# Patient Record
Sex: Female | Born: 1960 | Race: White | Hispanic: No | Marital: Married
Health system: Northeastern US, Academic
[De-identification: ages and names within clinical notes are randomized; demographics above are authoritative.]

## PROBLEM LIST (undated history)

## (undated) DIAGNOSIS — Z5189 Encounter for other specified aftercare: Secondary | ICD-10-CM

## (undated) DIAGNOSIS — F32A Depression, unspecified: Secondary | ICD-10-CM

## (undated) DIAGNOSIS — Z8616 Personal history of COVID-19: Secondary | ICD-10-CM

## (undated) DIAGNOSIS — F419 Anxiety disorder, unspecified: Secondary | ICD-10-CM

## (undated) DIAGNOSIS — K219 Gastro-esophageal reflux disease without esophagitis: Secondary | ICD-10-CM

## (undated) DIAGNOSIS — M1990 Unspecified osteoarthritis, unspecified site: Secondary | ICD-10-CM

## (undated) DIAGNOSIS — G8929 Other chronic pain: Secondary | ICD-10-CM

## (undated) DIAGNOSIS — K529 Noninfective gastroenteritis and colitis, unspecified: Secondary | ICD-10-CM

## (undated) DIAGNOSIS — T7840XA Allergy, unspecified, initial encounter: Secondary | ICD-10-CM

## (undated) DIAGNOSIS — D649 Anemia, unspecified: Secondary | ICD-10-CM

## (undated) DIAGNOSIS — IMO0002 Reserved for concepts with insufficient information to code with codable children: Secondary | ICD-10-CM

## (undated) DIAGNOSIS — M549 Dorsalgia, unspecified: Secondary | ICD-10-CM

## (undated) DIAGNOSIS — I10 Essential (primary) hypertension: Secondary | ICD-10-CM

## (undated) DIAGNOSIS — R011 Cardiac murmur, unspecified: Secondary | ICD-10-CM

## (undated) DIAGNOSIS — N200 Calculus of kidney: Secondary | ICD-10-CM

## (undated) DIAGNOSIS — E119 Type 2 diabetes mellitus without complications: Secondary | ICD-10-CM

## (undated) DIAGNOSIS — J302 Other seasonal allergic rhinitis: Secondary | ICD-10-CM

## (undated) DIAGNOSIS — K5090 Crohn's disease, unspecified, without complications: Secondary | ICD-10-CM

## (undated) DIAGNOSIS — Z87442 Personal history of urinary calculi: Secondary | ICD-10-CM

## (undated) DIAGNOSIS — N189 Chronic kidney disease, unspecified: Secondary | ICD-10-CM

## (undated) HISTORY — PX: KNEE ARTHROSCOPY: SHX127

## (undated) HISTORY — DX: Depression, unspecified: F32.A

## (undated) HISTORY — PX: CERVICAL SPINE SURGERY: SHX589

## (undated) HISTORY — DX: Anxiety disorder, unspecified: F41.9

## (undated) HISTORY — DX: Calculus of kidney: N20.0

## (undated) HISTORY — DX: Type 2 diabetes mellitus without complications: E11.9

## (undated) HISTORY — PX: ABDOMINAL HYSTERECTOMY: SHX81

## (undated) HISTORY — PX: COLONOSCOPY: SHX174

## (undated) HISTORY — PX: ROTATOR CUFF REPAIR: SHX139

## (undated) HISTORY — DX: Encounter for other specified aftercare: Z51.89

## (undated) HISTORY — DX: Allergy, unspecified, initial encounter: T78.40XA

## (undated) HISTORY — DX: Cardiac murmur, unspecified: R01.1

## (undated) HISTORY — DX: Gastro-esophageal reflux disease without esophagitis: K21.9

## (undated) HISTORY — PX: OTHER SURGICAL HISTORY: SHX169

## (undated) HISTORY — DX: Anemia, unspecified: D64.9

## (undated) HISTORY — DX: Essential (primary) hypertension: I10

## (undated) HISTORY — PX: LIVER SURGERY: SHX698

---

## 1990-11-03 DIAGNOSIS — I669 Occlusion and stenosis of unspecified cerebral artery: Secondary | ICD-10-CM

## 1990-11-03 HISTORY — PX: CERVICAL SPINE SURGERY: SHX589

## 1990-11-03 HISTORY — PX: LIVER SURGERY: SHX698

## 1990-11-03 HISTORY — PX: ROTATOR CUFF REPAIR: SHX139

## 1990-11-03 HISTORY — DX: Occlusion and stenosis of unspecified cerebral artery: I66.9

## 1990-11-03 HISTORY — PX: KNEE ARTHROSCOPY: SHX127

## 1998-03-15 ENCOUNTER — Ambulatory Visit (HOSPITAL_BASED_OUTPATIENT_CLINIC_OR_DEPARTMENT_OTHER): Admission: RE | Admit: 1998-03-15 | Discharge: 1998-03-15 | Payer: Self-pay | Admitting: Orthopedic Surgery

## 1999-03-14 ENCOUNTER — Other Ambulatory Visit: Admission: RE | Admit: 1999-03-14 | Discharge: 1999-03-14 | Payer: Self-pay | Admitting: Internal Medicine

## 1999-05-28 ENCOUNTER — Encounter (INDEPENDENT_AMBULATORY_CARE_PROVIDER_SITE_OTHER): Payer: Self-pay | Admitting: Specialist

## 1999-05-28 ENCOUNTER — Other Ambulatory Visit: Admission: RE | Admit: 1999-05-28 | Discharge: 1999-05-28 | Payer: Self-pay | Admitting: Obstetrics and Gynecology

## 1999-11-27 ENCOUNTER — Encounter: Admission: RE | Admit: 1999-11-27 | Discharge: 1999-11-27 | Payer: Self-pay | Admitting: Internal Medicine

## 1999-11-27 ENCOUNTER — Encounter: Payer: Self-pay | Admitting: Internal Medicine

## 1999-12-26 ENCOUNTER — Ambulatory Visit (HOSPITAL_COMMUNITY): Admission: RE | Admit: 1999-12-26 | Discharge: 1999-12-26 | Payer: Self-pay | Admitting: Gastroenterology

## 2000-04-02 ENCOUNTER — Other Ambulatory Visit: Admission: RE | Admit: 2000-04-02 | Discharge: 2000-04-02 | Payer: Self-pay | Admitting: *Deleted

## 2000-04-02 ENCOUNTER — Encounter (INDEPENDENT_AMBULATORY_CARE_PROVIDER_SITE_OTHER): Payer: Self-pay | Admitting: Specialist

## 2000-04-20 ENCOUNTER — Encounter (INDEPENDENT_AMBULATORY_CARE_PROVIDER_SITE_OTHER): Payer: Self-pay

## 2000-04-20 ENCOUNTER — Ambulatory Visit (HOSPITAL_COMMUNITY): Admission: RE | Admit: 2000-04-20 | Discharge: 2000-04-20 | Payer: Self-pay | Admitting: Gastroenterology

## 2000-06-17 ENCOUNTER — Other Ambulatory Visit: Admission: RE | Admit: 2000-06-17 | Discharge: 2000-06-17 | Payer: Self-pay | Admitting: Obstetrics & Gynecology

## 2000-06-17 ENCOUNTER — Encounter (INDEPENDENT_AMBULATORY_CARE_PROVIDER_SITE_OTHER): Payer: Self-pay

## 2000-07-21 ENCOUNTER — Encounter (INDEPENDENT_AMBULATORY_CARE_PROVIDER_SITE_OTHER): Payer: Self-pay | Admitting: Specialist

## 2000-07-22 ENCOUNTER — Inpatient Hospital Stay (HOSPITAL_COMMUNITY): Admission: AD | Admit: 2000-07-22 | Discharge: 2000-07-23 | Payer: Self-pay | Admitting: Obstetrics & Gynecology

## 2000-12-07 ENCOUNTER — Emergency Department (HOSPITAL_COMMUNITY): Admission: EM | Admit: 2000-12-07 | Discharge: 2000-12-07 | Payer: Self-pay | Admitting: Emergency Medicine

## 2001-08-18 ENCOUNTER — Other Ambulatory Visit: Admission: RE | Admit: 2001-08-18 | Discharge: 2001-08-18 | Payer: Self-pay | Admitting: Obstetrics & Gynecology

## 2001-08-26 ENCOUNTER — Encounter: Admission: RE | Admit: 2001-08-26 | Discharge: 2001-08-26 | Payer: Self-pay | Admitting: Obstetrics & Gynecology

## 2001-08-26 ENCOUNTER — Encounter: Payer: Self-pay | Admitting: Obstetrics & Gynecology

## 2002-07-01 ENCOUNTER — Ambulatory Visit (HOSPITAL_COMMUNITY): Admission: RE | Admit: 2002-07-01 | Discharge: 2002-07-01 | Payer: Self-pay | Admitting: Urology

## 2002-07-01 ENCOUNTER — Encounter: Payer: Self-pay | Admitting: Urology

## 2003-07-08 ENCOUNTER — Encounter: Payer: Self-pay | Admitting: Emergency Medicine

## 2003-07-08 ENCOUNTER — Emergency Department (HOSPITAL_COMMUNITY): Admission: EM | Admit: 2003-07-08 | Discharge: 2003-07-08 | Payer: Self-pay | Admitting: Emergency Medicine

## 2003-10-18 ENCOUNTER — Emergency Department (HOSPITAL_COMMUNITY): Admission: AD | Admit: 2003-10-18 | Discharge: 2003-10-18 | Payer: Self-pay | Admitting: Family Medicine

## 2003-11-03 ENCOUNTER — Emergency Department (HOSPITAL_COMMUNITY): Admission: AD | Admit: 2003-11-03 | Discharge: 2003-11-03 | Payer: Self-pay | Admitting: Family Medicine

## 2003-11-14 ENCOUNTER — Emergency Department (HOSPITAL_COMMUNITY): Admission: AD | Admit: 2003-11-14 | Discharge: 2003-11-14 | Payer: Self-pay | Admitting: Family Medicine

## 2003-11-15 ENCOUNTER — Emergency Department (HOSPITAL_COMMUNITY): Admission: AD | Admit: 2003-11-15 | Discharge: 2003-11-15 | Payer: Self-pay | Admitting: Family Medicine

## 2003-11-20 ENCOUNTER — Emergency Department (HOSPITAL_COMMUNITY): Admission: AD | Admit: 2003-11-20 | Discharge: 2003-11-20 | Payer: Self-pay | Admitting: Family Medicine

## 2003-11-27 ENCOUNTER — Emergency Department (HOSPITAL_COMMUNITY): Admission: AD | Admit: 2003-11-27 | Discharge: 2003-11-27 | Payer: Self-pay | Admitting: Family Medicine

## 2003-11-29 ENCOUNTER — Emergency Department (HOSPITAL_COMMUNITY): Admission: EM | Admit: 2003-11-29 | Discharge: 2003-11-29 | Payer: Self-pay | Admitting: Emergency Medicine

## 2004-02-20 ENCOUNTER — Emergency Department (HOSPITAL_COMMUNITY): Admission: EM | Admit: 2004-02-20 | Discharge: 2004-02-20 | Payer: Self-pay | Admitting: Family Medicine

## 2004-02-28 ENCOUNTER — Emergency Department (HOSPITAL_COMMUNITY): Admission: EM | Admit: 2004-02-28 | Discharge: 2004-02-28 | Payer: Self-pay | Admitting: Family Medicine

## 2004-04-26 ENCOUNTER — Inpatient Hospital Stay (HOSPITAL_COMMUNITY): Admission: AD | Admit: 2004-04-26 | Discharge: 2004-05-04 | Payer: Self-pay | Admitting: Gastroenterology

## 2004-07-10 ENCOUNTER — Emergency Department (HOSPITAL_COMMUNITY): Admission: EM | Admit: 2004-07-10 | Discharge: 2004-07-10 | Payer: Self-pay | Admitting: Family Medicine

## 2004-07-22 ENCOUNTER — Emergency Department (HOSPITAL_COMMUNITY): Admission: EM | Admit: 2004-07-22 | Discharge: 2004-07-22 | Payer: Self-pay | Admitting: Family Medicine

## 2004-10-21 ENCOUNTER — Emergency Department (HOSPITAL_COMMUNITY): Admission: EM | Admit: 2004-10-21 | Discharge: 2004-10-21 | Payer: Self-pay | Admitting: Family Medicine

## 2004-11-11 ENCOUNTER — Emergency Department (HOSPITAL_COMMUNITY): Admission: EM | Admit: 2004-11-11 | Discharge: 2004-11-11 | Payer: Self-pay | Admitting: Family Medicine

## 2006-03-31 ENCOUNTER — Emergency Department (HOSPITAL_COMMUNITY): Admission: EM | Admit: 2006-03-31 | Discharge: 2006-03-31 | Payer: Self-pay | Admitting: Family Medicine

## 2006-04-01 ENCOUNTER — Emergency Department (HOSPITAL_COMMUNITY): Admission: EM | Admit: 2006-04-01 | Discharge: 2006-04-01 | Payer: Self-pay | Admitting: Emergency Medicine

## 2006-04-03 ENCOUNTER — Ambulatory Visit (HOSPITAL_COMMUNITY): Admission: RE | Admit: 2006-04-03 | Discharge: 2006-04-03 | Payer: Self-pay | Admitting: Urology

## 2006-04-08 ENCOUNTER — Emergency Department (HOSPITAL_COMMUNITY): Admission: EM | Admit: 2006-04-08 | Discharge: 2006-04-08 | Payer: Self-pay | Admitting: Emergency Medicine

## 2006-04-16 ENCOUNTER — Encounter: Admission: RE | Admit: 2006-04-16 | Discharge: 2006-04-16 | Payer: Self-pay | Admitting: Internal Medicine

## 2006-05-19 ENCOUNTER — Encounter: Admission: RE | Admit: 2006-05-19 | Discharge: 2006-05-19 | Payer: Self-pay | Admitting: Internal Medicine

## 2006-06-01 ENCOUNTER — Encounter: Admission: RE | Admit: 2006-06-01 | Discharge: 2006-06-01 | Payer: Self-pay | Admitting: Internal Medicine

## 2006-07-10 ENCOUNTER — Encounter: Admission: RE | Admit: 2006-07-10 | Discharge: 2006-07-10 | Payer: Self-pay | Admitting: Internal Medicine

## 2007-02-04 ENCOUNTER — Encounter
Admission: RE | Admit: 2007-02-04 | Discharge: 2007-02-04 | Payer: Self-pay | Admitting: Physical Medicine and Rehabilitation

## 2008-08-04 ENCOUNTER — Emergency Department (HOSPITAL_COMMUNITY): Admission: EM | Admit: 2008-08-04 | Discharge: 2008-08-04 | Payer: Self-pay | Admitting: Emergency Medicine

## 2008-08-18 ENCOUNTER — Encounter (HOSPITAL_COMMUNITY): Admission: RE | Admit: 2008-08-18 | Discharge: 2008-11-02 | Payer: Self-pay | Admitting: Emergency Medicine

## 2008-09-11 ENCOUNTER — Encounter: Admission: RE | Admit: 2008-09-11 | Discharge: 2008-11-02 | Payer: Self-pay | Admitting: Internal Medicine

## 2008-09-24 ENCOUNTER — Emergency Department (HOSPITAL_COMMUNITY): Admission: EM | Admit: 2008-09-24 | Discharge: 2008-09-24 | Payer: Self-pay | Admitting: Emergency Medicine

## 2008-11-02 ENCOUNTER — Emergency Department (HOSPITAL_COMMUNITY): Admission: EM | Admit: 2008-11-02 | Discharge: 2008-11-02 | Payer: Self-pay | Admitting: Family Medicine

## 2009-01-28 ENCOUNTER — Emergency Department (HOSPITAL_COMMUNITY): Admission: EM | Admit: 2009-01-28 | Discharge: 2009-01-28 | Payer: Self-pay | Admitting: Family Medicine

## 2009-01-31 ENCOUNTER — Emergency Department (HOSPITAL_COMMUNITY): Admission: EM | Admit: 2009-01-31 | Discharge: 2009-01-31 | Payer: Self-pay | Admitting: Family Medicine

## 2009-03-23 ENCOUNTER — Emergency Department (HOSPITAL_COMMUNITY): Admission: EM | Admit: 2009-03-23 | Discharge: 2009-03-23 | Payer: Self-pay | Admitting: Emergency Medicine

## 2009-04-10 ENCOUNTER — Emergency Department (HOSPITAL_COMMUNITY): Admission: EM | Admit: 2009-04-10 | Discharge: 2009-04-10 | Payer: Self-pay | Admitting: Emergency Medicine

## 2009-04-20 ENCOUNTER — Emergency Department (HOSPITAL_COMMUNITY): Admission: EM | Admit: 2009-04-20 | Discharge: 2009-04-20 | Payer: Self-pay | Admitting: *Deleted

## 2009-10-08 ENCOUNTER — Emergency Department (HOSPITAL_COMMUNITY): Admission: EM | Admit: 2009-10-08 | Discharge: 2009-10-08 | Payer: Self-pay | Admitting: Family Medicine

## 2009-10-24 ENCOUNTER — Emergency Department (HOSPITAL_COMMUNITY): Admission: EM | Admit: 2009-10-24 | Discharge: 2009-10-24 | Payer: Self-pay | Admitting: Family Medicine

## 2009-12-09 ENCOUNTER — Emergency Department (HOSPITAL_COMMUNITY): Admission: EM | Admit: 2009-12-09 | Discharge: 2009-12-09 | Payer: Self-pay | Admitting: Emergency Medicine

## 2010-02-15 ENCOUNTER — Emergency Department (HOSPITAL_COMMUNITY): Admission: EM | Admit: 2010-02-15 | Discharge: 2010-02-15 | Payer: Self-pay | Admitting: Family Medicine

## 2010-09-13 ENCOUNTER — Emergency Department (HOSPITAL_COMMUNITY): Admission: EM | Admit: 2010-09-13 | Discharge: 2010-09-13 | Payer: Self-pay | Admitting: Emergency Medicine

## 2010-09-15 ENCOUNTER — Emergency Department (HOSPITAL_COMMUNITY): Admission: EM | Admit: 2010-09-15 | Discharge: 2010-09-15 | Payer: Self-pay | Admitting: Family Medicine

## 2010-09-18 ENCOUNTER — Emergency Department (HOSPITAL_COMMUNITY): Admission: EM | Admit: 2010-09-18 | Discharge: 2010-09-18 | Payer: Self-pay | Admitting: Family Medicine

## 2010-11-09 ENCOUNTER — Emergency Department (HOSPITAL_COMMUNITY)
Admission: EM | Admit: 2010-11-09 | Discharge: 2010-11-09 | Payer: Self-pay | Source: Home / Self Care | Admitting: Family Medicine

## 2010-11-15 ENCOUNTER — Emergency Department (HOSPITAL_COMMUNITY)
Admission: EM | Admit: 2010-11-15 | Discharge: 2010-11-15 | Payer: Self-pay | Source: Home / Self Care | Admitting: Family Medicine

## 2010-11-17 ENCOUNTER — Emergency Department (HOSPITAL_COMMUNITY)
Admission: EM | Admit: 2010-11-17 | Discharge: 2010-11-17 | Payer: Self-pay | Source: Home / Self Care | Admitting: Family Medicine

## 2010-11-24 ENCOUNTER — Encounter: Payer: Self-pay | Admitting: Otolaryngology

## 2011-01-14 LAB — POCT URINALYSIS DIPSTICK
Bilirubin Urine: NEGATIVE
Glucose, UA: NEGATIVE mg/dL
Hgb urine dipstick: NEGATIVE
Ketones, ur: NEGATIVE mg/dL
Nitrite: NEGATIVE
Protein, ur: NEGATIVE mg/dL
Specific Gravity, Urine: >=1.03 (ref 1.005–1.030)
Urobilinogen, UA: 0.2 mg/dL (ref 0.0–1.0)
pH: 5.5 (ref 5.0–8.0)

## 2011-01-14 LAB — POCT RAPID STREP A (OFFICE): Streptococcus, Group A Screen (Direct): NEGATIVE

## 2011-01-22 LAB — URINALYSIS, ROUTINE W REFLEX MICROSCOPIC
Bilirubin Urine: NEGATIVE
Glucose, UA: NEGATIVE mg/dL
Hgb urine dipstick: NEGATIVE
Ketones, ur: NEGATIVE mg/dL
Nitrite: NEGATIVE
Protein, ur: NEGATIVE mg/dL
Specific Gravity, Urine: 1.019 (ref 1.005–1.030)
Urobilinogen, UA: 0.2 mg/dL (ref 0.0–1.0)
pH: 7.5 (ref 5.0–8.0)
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
## 2011-03-21 NOTE — Op Note (Signed)
[facility]  Patient:    Sheila Vincent, Sheila Vincent                    MRN: [id number] Proc. Date: 07/21/00 Adm. Date:  [id number] Attending:  Princess Bruins                           Operative Report  DATE OF BIRTH:                May 08, 2061  PREOPERATIVE DIAGNOSIS:       Refractory menometrorrhagia with symptomatic uterine myomas.  POSTOPERATIVE DIAGNOSIS:      Refractory menometrorrhagia with symptomatic uterine myomas.  OPERATION:                    Laparoscopically-assisted vaginal hysterectomy.  SURGEON:                      Princess Bruins, M.D.  ASSISTANT:                    Freddie Apley, M.D.  ANESTHESIOLOGIST:             Loren Racer. Charlean Merl, M.D.  ESTIMATED BLOOD LOSS:         500 cc.  DESCRIPTION OF PROCEDURE:     Under general anesthesia with endotracheal intubation, the patient was placed in the lithotomy position for surgical laparoscopy.  She was prepped with Betadine in the abdominal, suprapubic, and vulvar areas.  The bladder catheter was inserted and the patient was draped as usual.  The vaginal exam revealed an anteverted uterus with a normal volume and mobile.  No adnexal mass.  Slight irregularity associated with myoma.  The speculum exam revealed a normal cervix, vagina, and vulva.  The uterus was cannulated.  The speculum was then removed.  An infraumbilical incision was made with a scalpel for 10 mm.  The Verres needle was inserted while raising the abdominal wall.  Security taps were done.  A pneumoperitoneum was created with 3.5 L of CO2.  Then the Veress needle was removed.  The trocar was inserted with the laparoscope.  A counterincision in the suprapubic area was done over 5 mm with a scalpel.  The 5 mm trocar was inserted with a probe. Inspection of the abdominal cavity revealed a normal liver with no adhesions seen.  The pelvic cavity revealed a slightly increased uterine volume with some irregularity.  The  fibroids were intramural or submucosal.  The two tubes were normal, as well as the two ovaries.  No pathology in the pelvis was seen otherwise.  Two counterincisions were made, one in the right iliac area and the other one in the left iliac area over 5 mm.  Then 5 mm trocars were inserted in both locations.  The instruments were inserted, the tripolar and the clamp. We proceeded exactly the same way on the left side as the right side, starting with the left, cauterizing and then cutting the round ligament, the tube, the utero-ovarian ligament and going down following the border of the uterus and stopping just before the uterine artery.  We then opened the peritoneum at the level of the lower uterine segment with scissors and reclined the bladder downward.  The hemostasis being adequate, the laparoscopic parts being finished, we then removed the instruments, leaving the trocars in place and moved vaginally.  The patient was repositioned.  The weighted speculum  was inserted.  We then removed the uterine cannula and put the Hulka tenaculum on the cervix, one on the anterior lip and the other on the posterior lip.  We then infiltrated the traction between the cervix and vagina all around with Pitressin 10 and 50.  We then sectioned circularly the junction between the vagina and the cervix with the scalpel all around the cervix.  We then opened the peritoneum posteriorly and inserted the weighted speculum there.  The interperitoneum was also opened and the retractor was inserted.  We clamped on each side the uterosacral ligaments and the cardinal ligaments with curved Heaney.  We sectioned with the Mayo scissors and sutured with 0 Vicryl in an ______ stitch.  We then clamped on each the uterine arteries.  We sectioned with the Mayo scissors and sutured with 0 Vicryl.  We did the last clamp on each side, reaching the area where the surgery was done laparoscopically.  We clamped a section with  Mayos and sutured with 0 Vicryl. To achieve those two last clamps, we removed the cervix and opened the uterus in the midline.  The two pieces of the uterus were sent to pathology.  We then completed hemostasis at the level of the left uterine artery with an extra suture.  Then hemostasis was good on both sides.  All pedicles were looked at.  We therefore pursued with suspension.  Using 0 Vicryl, we started on the left side, going through the anterior vaginal wall, the anterior peritoneum, the uterosacral ligaments, and then posterior peritoneum and the posterior vaginal wall.  This was kept on hemostats.  We pursued exactly the same on the right side.  We then closed the uterosacral ligaments by attaching them together in the midline.  We then closed the vaginal vault longitudinally with X stitches of 0 Vicryl.  We then attached the suspensory stitches on each side.  The hemostasis was very good.  No packing was done in the vagina.  The instruments were removed.  We went back abdominally.  The peritoneum was recreated.  With irrigation and suction, we evaluated hemostasis in the pelvis.  All pedicles were showing good hemostasis.  Absolutely no bleeding was present.  The irrigation water was clear.  We therefore removed the instruments.  Pictures were taken before and after the surgery.  We removed all instruments.  The CO2 was evacuated.  The aponeurosis was closed with 0 Vicryl at the umbilicus. The skin was closed with 4-0 Monocryl at all incisions.  The estimated blood loss was 500 cc.  The urine was clear.  The count of gauze and instruments was complete x 2.  No complications occurred.  The patient was sent to the recovery room in good status. DD:  07/21/00 TD:  07/22/00 Job: [id number]

## 2011-03-21 NOTE — Op Note (Signed)
Sheila Vincent, Sheila Vincent             ACCOUNT NO.:  [id number]   MEDICAL RECORD NO.:  [id number]          PATIENT TYPE:  AMB   LOCATION:  DAY                          FACILITY:  [facility]   PHYSICIAN:  Hanley Ben, M.D.  DATE OF BIRTH:  Jan 10, 1961   DATE OF PROCEDURE:  04/03/2006  DATE OF DISCHARGE:                                 OPERATIVE REPORT   PREOPERATIVE DIAGNOSIS:  Left ureteral stone.   POSTOPERATIVE DIAGNOSIS:  Left ureteral stone.   PROCEDURE:  Cystoscopy, left retrograde pyelogram, ureteroscopy, stone  extraction, insertion of double-J catheter.   SURGEON:  Arvil Persons, M.D.   ANESTHESIA:  General.   INDICATION:  The patient is a 50 year old female who had  sudden onset of  left flank pain, nausea, vomiting, diarrhea about four weeks ago.Marland Kitchen  She was  seen at urgent care and was treated for diarrhea.  That has subsided,  however, she continued to complain of left flank pain.  She then went to the  emergency room and a CT scan of the abdomen and pelvis showed a 4 mm stone  in the left distal ureter with hydronephrosis.  She was seen in the office  yesterday.  She continues to complain of pain.  She is scheduled today for  stone manipulation.   DESCRIPTION OF PROCEDURE:  Under general anesthesia, the patient was prepped  and draped and placed in the dorsal lithotomy position .  A 22 Wappler  cystoscope was inserted in the bladder.  The bladder mucosa is normal.  There is no stone or tumor in the bladder.  The ureteral orifices are in  normal position and shape.  A cone tip catheter was passed through the  cystoscope into the left ureteral orifice.  About 8 mL of contrast was then  injected through the cone tip catheter.  There is a filling defect in the  distal ureter with proximal hydroureter, the renal pelvis is moderately  dilated.  The cone tip catheter was then removed.   A Sansor guidewire was passed through the cystoscope into the left ureter  and the cystoscope  was removed.  A number 6.5 French rigid ureteroscope was  passed in the bladder but could not be passed beyond the intramural ureter.  The cystoscope was removed.  The inner sheath of the ureteroscope access  sheath was passed over the guidewire and the intramural ureter was dilated.  Then, the ureteroscope was passed in the bladder and without difficulty in  the ureter. There is a stone in the distal ureter.  The stone was grasped  with a nitinol stone basket and a small fragment of the stone was removed  with the first passage of the basket.  The larger stone was then removed  with another passage of the nitinol basket.  The fragments were removed.  The ureteroscope was then reinserted in the bladder and the ureter.  There  is no evidence of remaining stone in the ureter and the ureteroscope was  advanced without difficulty all the way up into the upper ureter.   Contrast was then injected through the ureteroscope and there  is no evidence  of extravasation of contrast and there is no filling defect in the ureter  nor in the kidney or in the renal pelvis.  The ureteroscope was then  removed.  The guidewire was back loaded into the cystoscope and a #6-French  24 double-J catheter was passed over the guide  wire, the proximal curl of the double-J catheter is in the renal pelvis, the  distal curl is in the bladder.  The bladder was then emptied and the  cystoscope and guidewire were removed.  The patient tolerated the procedure  well and left the OR in satisfactory condition to post anesthesia care unit.      Hanley Ben, M.D.  Electronically Signed     MN/MEDQ  D:  04/03/2006  T:  04/03/2006  Job:  [id number]

## 2011-03-21 NOTE — Discharge Summary (Signed)
Sheila Vincent, Sheila Vincent                       ACCOUNT NO.:  [id number]   MEDICAL RECORD NO.:  [id number]                   PATIENT TYPE:  INP   LOCATION:  5736                                 FACILITY:  [facility]   PHYSICIAN:  Earle Gell, M.D.                DATE OF BIRTH:  22-Oct-1961   DATE OF ADMISSION:  04/26/2004  DATE OF DISCHARGE:  05/04/2004                                 DISCHARGE SUMMARY   DISCHARGE DIAGNOSES:  1. Chronic colitis, probable Crohn colitis.  2. Simple cyst, right ovary (2-cm diameter).  3. Hysterectomy for menorrhagia, performed by Dr. Princess Bruins.  4. Non-obstructing kidney stones.   DISCHARGE MEDICATIONS:  1. Prednisone 40 mg each morning for two weeks, 35 mg each morning for one     week, 30 mg each morning for one week, 25 mg each morning for one week,     20 mg each morning for one week, 15 mg each morning for one week, 10 mg     each morning for one week.  2. Asacol 1600 mg b.i.d.  3. Vicodin one by mouth every four hours p.r.n. right ovary pain.  4. Loperamide 2 mg q.4h. p.r.n. diarrhea.   OFFICE FOLLOWUP:  1. I have asked [name] to make an appointment with her gynecologist, Dr.     Princess Bruins, to evaluate the 2-cm simple cyst in her right ovary.  2. She will also schedule an appointment with me next week to discuss     azathioprine therapy.   PENDING LAB AT DISCHARGE:  1. TB skin test.  2. Thiopurine methyltransferase enzyme level, that is TPMT.   HOSPITAL COURSE:  1. Sheila Vincent is a 50 year old female who was diagnosed with Crohn     colitis, in 1989, by Dr. Mayme Genta, who performed an     esophagogastroduodenoscopy colonoscopy and performed a small-bowel follow-     through x-ray series.  On April 20, 2000, Tilda underwent a second     colonoscopy at [facility], performed by me which revealed     granular, non-friable, non-ulcerated mucosa from the rectum to the cecum;     the distal ilium  appeared normal.  Sheila Vincent was admitted to the     hospital, May 02, 2004, with an exacerbation of her Crohn colitis.  She     also had unexplained right sided pelvic pain.  She was initially placed     on Solu-Medrol, Flagyl, ciprofloxacin, and Asacol.  Her stool was     screened for enteric pathogens and C. difficile toxin; both tests were     negative, and she symptomatically did not respond to therapy.  She     underwent a proctocolonoscopy, May 02, 2004, which revealed severe     generalized proctocolitis to the mid transverse colon.  Colonic mucosa     from the mid transverse colon to the cecum appeared  normal.  The distal     ilium appeared normal.  2. Ms. Burleigh underwent a transvaginal-pelvic ultrasound to evaluate her     right pelvic pain.  Verbal report of the ultrasound shows a 2-cm simple     cyst in the right ovary which will need to be further evaluated by Dr.     Princess Bruins.  3. Sheila Vincent is discharged in stable medical condition.  I will meet with     her in my office to discuss azathioprine therapy next week.   LABORATORY DATA:  White blood cell count 11,800, hemoglobin 12.8 grams,  platelet count 448,000.  Sedimentation rate 28 mm/hr.  Complete metabolic  profile, on admission, normal.  Urinalysis normal.  Stool culture for  enteric pathogens negative.  Stool screen for C. difficile toxin negative.   PENDING LABORATORY DATA:  1. Thiopurine methyltransferase enzymes activity.  2. TB skin test.   RADIOLOGY:  CT scan of the abdomen and pelvis, on admission, revealed  diffuse left colon thickening with adjacent inflammation consistent with  Crohn colitis; several small hepatic lesions too small to characterize; non-  obstructing bilateral intrarenal calculi.  Pelvic ultrasound showed a normal  appendix; remote hysterectomy.   Verbal report of pelvic/transvaginal ultrasound shows a simple cyst in the  right ovary.                                                 Earle Gell, M.D.    MJ/MEDQ  D:  05/03/2004  T:  05/05/2004  Job:  [id number]   cc:   Princess Bruins, M.D.  [address]  [place]  [place] [address]  Fax: [phone]

## 2011-07-21 ENCOUNTER — Emergency Department (HOSPITAL_COMMUNITY)
Admission: EM | Admit: 2011-07-21 | Discharge: 2011-07-22 | Disposition: A | Discharge status: Home / Self Care | Payer: Self-pay | Attending: Emergency Medicine | Admitting: Emergency Medicine

## 2011-07-21 DIAGNOSIS — R490 Dysphonia: Secondary | ICD-10-CM | POA: Insufficient documentation

## 2011-07-21 DIAGNOSIS — J040 Acute laryngitis: Secondary | ICD-10-CM | POA: Insufficient documentation

## 2011-07-21 DIAGNOSIS — J329 Chronic sinusitis, unspecified: Secondary | ICD-10-CM | POA: Insufficient documentation

## 2011-08-05 LAB — URINALYSIS, ROUTINE W REFLEX MICROSCOPIC
Bilirubin Urine: NEGATIVE
Glucose, UA: NEGATIVE
Hgb urine dipstick: NEGATIVE
Nitrite: NEGATIVE
Protein, ur: NEGATIVE
Specific Gravity, Urine: 1.025
Urobilinogen, UA: 0.2
pH: 6.5

## 2011-08-08 LAB — POCT URINALYSIS DIP (DEVICE)
Bilirubin Urine: NEGATIVE
Glucose, UA: NEGATIVE mg/dL
Ketones, ur: NEGATIVE mg/dL
Nitrite: NEGATIVE
Protein, ur: 30 mg/dL — AB
Specific Gravity, Urine: 1.02 (ref 1.005–1.030)
Urobilinogen, UA: 0.2 mg/dL (ref 0.0–1.0)
pH: 7 (ref 5.0–8.0)

## 2011-08-08 LAB — STONE ANALYSIS: Stone Weight KSTONE: 0.006 g

## 2011-10-11 ENCOUNTER — Emergency Department (HOSPITAL_COMMUNITY)
Admission: EM | Admit: 2011-10-11 | Discharge: 2011-10-11 | Disposition: A | Discharge status: Home / Self Care | Payer: Self-pay | Attending: Emergency Medicine | Admitting: Emergency Medicine

## 2011-10-11 ENCOUNTER — Encounter: Payer: Self-pay | Admitting: *Deleted

## 2011-10-11 DIAGNOSIS — R51 Headache: Secondary | ICD-10-CM | POA: Insufficient documentation

## 2011-10-11 DIAGNOSIS — R6883 Chills (without fever): Secondary | ICD-10-CM | POA: Insufficient documentation

## 2011-10-11 DIAGNOSIS — IMO0001 Reserved for inherently not codable concepts without codable children: Secondary | ICD-10-CM | POA: Insufficient documentation

## 2011-10-11 DIAGNOSIS — J343 Hypertrophy of nasal turbinates: Secondary | ICD-10-CM | POA: Insufficient documentation

## 2011-10-11 DIAGNOSIS — R059 Cough, unspecified: Secondary | ICD-10-CM | POA: Insufficient documentation

## 2011-10-11 DIAGNOSIS — J45909 Unspecified asthma, uncomplicated: Secondary | ICD-10-CM | POA: Insufficient documentation

## 2011-10-11 DIAGNOSIS — R000 Tachycardia, unspecified: Secondary | ICD-10-CM | POA: Insufficient documentation

## 2011-10-11 DIAGNOSIS — R0982 Postnasal drip: Secondary | ICD-10-CM | POA: Insufficient documentation

## 2011-10-11 DIAGNOSIS — R05 Cough: Secondary | ICD-10-CM | POA: Insufficient documentation

## 2011-10-11 DIAGNOSIS — J329 Chronic sinusitis, unspecified: Secondary | ICD-10-CM | POA: Insufficient documentation

## 2011-10-11 DIAGNOSIS — J3489 Other specified disorders of nose and nasal sinuses: Secondary | ICD-10-CM | POA: Insufficient documentation

## 2011-10-11 MED ORDER — ACETAMINOPHEN 325 MG PO TABS
ORAL_TABLET | ORAL | Status: AC
  Filled 2011-10-11: qty 3

## 2011-10-11 MED ORDER — DOXYCYCLINE HYCLATE 100 MG PO CAPS: 100.0000 mg | ORAL_CAPSULE | Freq: Two times a day (BID) | ORAL | Status: AC

## 2011-10-11 MED ORDER — LORATADINE 10 MG PO TABS: 10.0000 mg | ORAL_TABLET | Freq: Every day | ORAL | Status: DC

## 2011-10-11 MED ORDER — DOXYCYCLINE HYCLATE 100 MG PO CAPS: 100.0000 mg | ORAL_CAPSULE | Freq: Two times a day (BID) | ORAL | Status: DC

## 2011-10-11 MED ORDER — OXYMETAZOLINE HCL 0.05 % NA SOLN: 2.0000 | Freq: Two times a day (BID) | NASAL | Status: AC

## 2011-10-11 MED ORDER — ACETAMINOPHEN 500 MG PO TABS
1000.0000 mg | ORAL_TABLET | Freq: Once | ORAL | Status: AC
  Administered 2011-10-11: 1000 mg via ORAL

## 2011-10-11 MED ORDER — OXYMETAZOLINE HCL 0.05 % NA SOLN: 2.0000 | Freq: Two times a day (BID) | NASAL | Status: DC

## 2011-10-11 NOTE — ED Notes (Signed)
For the past month, patient has experienced nasal drainage, and now she is generalized body aches, cough, and weakness

## 2011-10-11 NOTE — ED Provider Notes (Signed)
History     CSN: [id number] Arrival date & time: 10/11/2011  8:28 PM   First MD Initiated Contact with Patient 10/11/11 2138      Chief Complaint  Patient presents with  . Nasal Congestion    (Consider location/radiation/quality/duration/timing/severity/associated sxs/prior treatment) HPI Comments: Patient reports she has had nasal congestion and post-nasal drip for several months, over the past few days has developed headaches, body aches, chills, facial pain, worsening nasal congestion, and cough.  Patient has PCP but does not have money to pay her bill.  Has many environmental allergies but cannot afford allergy medications or allergies.  Has also had recent sick contacts with respiratory symptoms.    The history is provided by the patient.    Past Medical History  Diagnosis Date  . Asthma     History reviewed. No pertinent past surgical history.  History reviewed. No pertinent family history.  History  Substance Use Topics  . Smoking status: Never Smoker   . Smokeless tobacco: Not on file  . Alcohol Use: No    OB History    Grav Para Term Preterm Abortions TAB SAB Ect Mult Living                  Review of Systems  All other systems reviewed and are negative.    Allergies  Bactrim  Home Medications   Current Outpatient Rx  Name Route Sig Dispense Refill  . GUAIFENESIN ER 600 MG PO TB12 Oral Take 600 mg by mouth 2 (two) times daily as needed. For congestion.     Marland Kitchen MENTHOL 3 MG MT LOZG Oral Take 1 lozenge by mouth as needed. For congestion and mucous.     . OXYCODONE-ACETAMINOPHEN 5-325 MG PO TABS Oral Take 1 tablet by mouth every 4 (four) hours as needed. For pain.      BP 136/62  Pulse 117  Temp(Src) 99.2 F (37.3 C) (Oral)  Resp 20  SpO2 100%  Physical Exam  Nursing note and vitals reviewed. Constitutional: She is oriented to person, place, and time. She appears well-developed and well-nourished.  HENT:  Head: Atraumatic.  Nose: Mucosal edema  and rhinorrhea present. No nose lacerations or nasal septal hematoma. No epistaxis.  No foreign bodies. Right sinus exhibits maxillary sinus tenderness. Right sinus exhibits no frontal sinus tenderness. Left sinus exhibits maxillary sinus tenderness. Left sinus exhibits no frontal sinus tenderness.  Mouth/Throat: Uvula is midline. No uvula swelling. Posterior oropharyngeal erythema present. No posterior oropharyngeal edema or tonsillar abscesses.       Mucus drainage in back of throat.   Neck: Trachea normal, normal range of motion and phonation normal. Neck supple. No tracheal tenderness present. No rigidity. No tracheal deviation and normal range of motion present.  Cardiovascular: Regular rhythm.  Tachycardia present.   Pulmonary/Chest: Effort normal and breath sounds normal. No stridor. No respiratory distress. She has no wheezes. She has no rales. She exhibits no tenderness.  Neurological: She is alert and oriented to person, place, and time.    ED Course  Procedures (including critical care time)  Labs Reviewed - No data to display No results found.   1. Sinusitis       MDM  Patient with chronic sinus problems and allergic rhinitis p/w worsening of typical symptoms, headache, facial pain and subjective fevers.  Have given antibiotic for coverage of possible bacterial sinusitis.  Have given PCP follow up that patient may be able to afford.  Rise Patience, [place] 10/11/11 2240

## 2011-10-12 NOTE — ED Provider Notes (Signed)
Medical screening examination/treatment/procedure(s) were performed by non-physician practitioner and as supervising physician I was immediately available for consultation/collaboration.   Hoy Morn, MD 10/12/11 [phone]

## 2011-10-21 ENCOUNTER — Emergency Department (INDEPENDENT_AMBULATORY_CARE_PROVIDER_SITE_OTHER)
Admission: EM | Admit: 2011-10-21 | Discharge: 2011-10-21 | Disposition: A | Discharge status: Home / Self Care | Payer: Self-pay | Source: Home / Self Care | Attending: Emergency Medicine | Admitting: Emergency Medicine

## 2011-10-21 ENCOUNTER — Encounter (HOSPITAL_COMMUNITY): Payer: Self-pay | Admitting: *Deleted

## 2011-10-21 DIAGNOSIS — S20229A Contusion of unspecified back wall of thorax, initial encounter: Secondary | ICD-10-CM

## 2011-10-21 DIAGNOSIS — S60221A Contusion of right hand, initial encounter: Secondary | ICD-10-CM

## 2011-10-21 DIAGNOSIS — S60229A Contusion of unspecified hand, initial encounter: Secondary | ICD-10-CM

## 2011-10-21 HISTORY — DX: Unspecified osteoarthritis, unspecified site: M19.90

## 2011-10-21 HISTORY — DX: Other chronic pain: G89.29

## 2011-10-21 HISTORY — DX: Dorsalgia, unspecified: M54.9

## 2011-10-21 MED ORDER — NAPROXEN 500 MG PO TABS: 500.0000 mg | ORAL_TABLET | Freq: Two times a day (BID) | ORAL | Status: DC

## 2011-10-21 MED ORDER — METHOCARBAMOL 500 MG PO TABS: 500.0000 mg | ORAL_TABLET | Freq: Two times a day (BID) | ORAL | Status: AC

## 2011-10-21 NOTE — ED Notes (Signed)
Pt is here s/p fall.  Pt states she was carrying a ladder and her right leg gave out.  She then fell and hit her mid back and right wrist on a large rock.  Problem is pain in back and burning sensation in bilat hands.  Pt has history of chronic back pain and is currently taking percocet Q8 for pain control.
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
## 2011-11-05 ENCOUNTER — Emergency Department (INDEPENDENT_AMBULATORY_CARE_PROVIDER_SITE_OTHER)
Admission: EM | Admit: 2011-11-05 | Discharge: 2011-11-05 | Disposition: A | Discharge status: Home / Self Care | Payer: Self-pay | Source: Home / Self Care | Attending: Emergency Medicine | Admitting: Emergency Medicine

## 2011-11-05 ENCOUNTER — Encounter (HOSPITAL_COMMUNITY): Payer: Self-pay | Admitting: Emergency Medicine

## 2011-11-05 DIAGNOSIS — J309 Allergic rhinitis, unspecified: Secondary | ICD-10-CM

## 2011-11-05 DIAGNOSIS — Z9109 Other allergy status, other than to drugs and biological substances: Secondary | ICD-10-CM

## 2011-11-05 HISTORY — DX: Other seasonal allergic rhinitis: J30.2

## 2011-11-05 MED ORDER — ALBUTEROL SULFATE HFA 108 (90 BASE) MCG/ACT IN AERS: 1.0000 | INHALATION_SPRAY | Freq: Four times a day (QID) | RESPIRATORY_TRACT | Status: DC | PRN

## 2011-11-05 MED ORDER — FLUTICASONE PROPIONATE 50 MCG/ACT NA SUSP: 2.0000 | Freq: Every day | NASAL | Status: DC

## 2011-11-05 MED ORDER — PSEUDOEPHEDRINE-GUAIFENESIN ER 120-1200 MG PO TB12: 1.0000 | ORAL_TABLET | Freq: Two times a day (BID) | ORAL | Status: DC

## 2011-11-05 NOTE — ED Notes (Signed)
Patient reports she is seen for the same symptoms every 3-4 months.  Patient has made broad statement over the counter medicine "dont work" "dont have money to waste on medicines that aren't going to work.
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
## 2011-11-05 NOTE — ED Notes (Signed)
Patient with extensive questions, patient asking many questions about orange card.  Given information related to obtaining orange card at patient's request.

## 2011-11-05 NOTE — ED Notes (Signed)
Patient did not fill at paper that assists with triaging.

## 2011-11-05 NOTE — ED Notes (Signed)
Drainage from sinus, cough, sneezing, hoarse, chest tightness.

## 2012-01-04 ENCOUNTER — Encounter (HOSPITAL_COMMUNITY): Payer: Self-pay

## 2012-01-04 ENCOUNTER — Emergency Department (INDEPENDENT_AMBULATORY_CARE_PROVIDER_SITE_OTHER)
Admission: EM | Admit: 2012-01-04 | Discharge: 2012-01-04 | Disposition: A | Discharge status: Home Health | Payer: Self-pay | Source: Home / Self Care | Attending: Emergency Medicine | Admitting: Emergency Medicine

## 2012-01-04 ENCOUNTER — Emergency Department (INDEPENDENT_AMBULATORY_CARE_PROVIDER_SITE_OTHER): Payer: Self-pay

## 2012-01-04 DIAGNOSIS — M549 Dorsalgia, unspecified: Secondary | ICD-10-CM

## 2012-01-04 LAB — POCT URINALYSIS DIP (DEVICE)
Bilirubin Urine: NEGATIVE
Glucose, UA: NEGATIVE mg/dL
Hgb urine dipstick: NEGATIVE
Ketones, ur: NEGATIVE mg/dL
Leukocytes, UA: NEGATIVE
Nitrite: NEGATIVE
Protein, ur: NEGATIVE mg/dL
Specific Gravity, Urine: 1.02 (ref 1.005–1.030)
Urobilinogen, UA: 0.2 mg/dL (ref 0.0–1.0)
pH: 6 (ref 5.0–8.0)

## 2012-01-04 MED ORDER — DICLOFENAC SODIUM 75 MG PO TBEC: 75.0000 mg | DELAYED_RELEASE_TABLET | Freq: Two times a day (BID) | ORAL | Status: DC

## 2012-01-04 MED ORDER — CYCLOBENZAPRINE HCL 10 MG PO TABS: 10.0000 mg | ORAL_TABLET | Freq: Two times a day (BID) | ORAL | Status: AC | PRN
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
## 2012-01-04 NOTE — ED Notes (Signed)
Instructions given to pt , pt left without signing e signature

## 2012-01-04 NOTE — ED Notes (Signed)
Pt states she fell 2 weeks ago has been having lower back pain, but has been improving until last night, states she has had kidney stones in the past unsure if pain is from stone or fall.

## 2012-01-04 NOTE — Discharge Instructions (Signed)
Back Pain, Adult Low back pain is very common. About 1 in 5 people have back pain.The cause of low back pain is rarely dangerous. The pain often gets better over time.About half of people with a sudden onset of back pain feel better in just 2 weeks. About 8 in 10 people feel better by 6 weeks.  CAUSES Some common causes of back pain include:  Strain of the muscles or ligaments supporting the spine.   Wear and tear (degeneration) of the spinal discs.   Arthritis.   Direct injury to the back.  DIAGNOSIS Most of the time, the direct cause of low back pain is not known.However, back pain can be treated effectively even when the exact cause of the pain is unknown.Answering your caregiver's questions about your overall health and symptoms is one of the most accurate ways to make sure the cause of your pain is not dangerous. If your caregiver needs more information, he or she may order lab work or imaging tests (X-rays or MRIs).However, even if imaging tests show changes in your back, this usually does not require surgery. HOME CARE INSTRUCTIONS For many people, back pain returns.Since low back pain is rarely dangerous, it is often a condition that people can learn to [facility] their own.   Remain active. It is stressful on the back to sit or stand in one place. Do not sit, drive, or stand in one place for more than 30 minutes at a time. Take short walks on level surfaces as soon as pain allows.Try to increase the length of time you walk each day.   Do not stay in bed.Resting more than 1 or 2 days can delay your recovery.   Do not avoid exercise or work.Your body is made to move.It is not dangerous to be active, even though your back may hurt.Your back will likely heal faster if you return to being active before your pain is gone.   Pay attention to your body when you bend and lift. Many people have less discomfortwhen lifting if they bend their knees, keep the load close to their  bodies,and avoid twisting. Often, the most comfortable positions are those that put less stress on your recovering back.   Find a comfortable position to sleep. Use a firm mattress and lie on your side with your knees slightly bent. If you lie on your back, put a pillow under your knees.   Only take over-the-counter or prescription medicines as directed by your caregiver. Over-the-counter medicines to reduce pain and inflammation are often the most helpful.Your caregiver may prescribe muscle relaxant drugs.These medicines help dull your pain so you can more quickly return to your normal activities and healthy exercise.   Put ice on the injured area.   Put ice in a plastic bag.   Place a towel between your skin and the bag.   Leave the ice on for 15 to 20 minutes, 3 to 4 times a day for the first 2 to 3 days. After that, ice and heat may be alternated to reduce pain and spasms.   Ask your caregiver about trying back exercises and gentle massage. This may be of some benefit.   Avoid feeling anxious or stressed.Stress increases muscle tension and can worsen back pain.It is important to recognize when you are anxious or stressed and learn ways to manage it.Exercise is a great option.  SEEK MEDICAL CARE IF:  You have pain that is not relieved with rest or medicine.   You have  pain that does not improve in 1 week.   You have new symptoms.   You are generally not feeling well.  SEEK IMMEDIATE MEDICAL CARE IF:   You have pain that radiates from your back into your legs.   You develop new bowel or bladder control problems.   You have unusual weakness or numbness in your arms or legs.   You develop nausea or vomiting.   You develop abdominal pain.   You feel faint.  Document Released: 10/20/2005 Document Revised: 10/09/2011 Document Reviewed: 03/10/2011 [facility] Patient Information 2012 [place].

## 2012-01-11 NOTE — ED Provider Notes (Signed)
Dx: back pain  Medical screening examination/treatment/procedure(s) were performed by non-physician practitioner and as supervising physician I was immediately available for consultation/collaboration.  Cherly Beach MD   Melynda Ripple, MD 01/11/12 [phone]

## 2012-03-22 ENCOUNTER — Encounter (HOSPITAL_COMMUNITY): Payer: Self-pay | Admitting: Emergency Medicine

## 2012-03-22 ENCOUNTER — Emergency Department (HOSPITAL_COMMUNITY): Payer: Self-pay

## 2012-03-22 ENCOUNTER — Emergency Department (HOSPITAL_COMMUNITY)
Admission: EM | Admit: 2012-03-22 | Discharge: 2012-03-23 | Disposition: A | Discharge status: Home / Self Care | Payer: Self-pay | Attending: Emergency Medicine | Admitting: Emergency Medicine

## 2012-03-22 DIAGNOSIS — S20219A Contusion of unspecified front wall of thorax, initial encounter: Secondary | ICD-10-CM | POA: Insufficient documentation

## 2012-03-22 DIAGNOSIS — J45909 Unspecified asthma, uncomplicated: Secondary | ICD-10-CM | POA: Insufficient documentation

## 2012-03-22 DIAGNOSIS — R079 Chest pain, unspecified: Secondary | ICD-10-CM | POA: Insufficient documentation

## 2012-03-22 DIAGNOSIS — S300XXA Contusion of lower back and pelvis, initial encounter: Secondary | ICD-10-CM

## 2012-03-22 DIAGNOSIS — S20229A Contusion of unspecified back wall of thorax, initial encounter: Secondary | ICD-10-CM | POA: Insufficient documentation

## 2012-03-22 DIAGNOSIS — W108XXA Fall (on) (from) other stairs and steps, initial encounter: Secondary | ICD-10-CM | POA: Insufficient documentation

## 2012-03-22 DIAGNOSIS — Z79899 Other long term (current) drug therapy: Secondary | ICD-10-CM | POA: Insufficient documentation

## 2012-03-22 DIAGNOSIS — G8929 Other chronic pain: Secondary | ICD-10-CM | POA: Insufficient documentation

## 2012-03-22 DIAGNOSIS — M545 Low back pain, unspecified: Secondary | ICD-10-CM | POA: Insufficient documentation

## 2012-03-22 DIAGNOSIS — K5090 Crohn's disease, unspecified, without complications: Secondary | ICD-10-CM | POA: Insufficient documentation

## 2012-03-22 HISTORY — DX: Crohn's disease, unspecified, without complications: K50.90

## 2012-03-22 HISTORY — DX: Reserved for concepts with insufficient information to code with codable children: IMO0002

## 2012-03-22 MED ORDER — HYDROMORPHONE HCL PF 2 MG/ML IJ SOLN
2.0000 mg | Freq: Once | INTRAMUSCULAR | Status: AC
  Administered 2012-03-22: 2 mg via INTRAVENOUS
  Filled 2012-03-22: qty 1

## 2012-03-22 NOTE — ED Notes (Signed)
Right rib tenderness noted

## 2012-03-22 NOTE — ED Notes (Signed)
Patient presents walking slowly after falling down about 5 steps.  States she has pain to the right side of her back.
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
## 2012-03-22 NOTE — ED Notes (Signed)
Received pt. From triage via w/c, pt. Alert and oriented,
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
## 2012-03-23 NOTE — Discharge Instructions (Signed)
Your x-rays show no signs of rib fractures, there are old records of your lumbar spine which have not changed since prior x-rays. Please take your pain medications at home as your doctor as prescribed and followup in 2 days for a recheck as needed.

## 2012-03-29 ENCOUNTER — Encounter (HOSPITAL_COMMUNITY): Payer: Self-pay

## 2012-03-29 ENCOUNTER — Emergency Department (INDEPENDENT_AMBULATORY_CARE_PROVIDER_SITE_OTHER)
Admission: EM | Admit: 2012-03-29 | Discharge: 2012-03-29 | Disposition: A | Discharge status: Home / Self Care | Payer: Self-pay | Source: Home / Self Care | Attending: Family Medicine | Admitting: Family Medicine

## 2012-03-29 DIAGNOSIS — S20221D Contusion of right back wall of thorax, subsequent encounter: Secondary | ICD-10-CM

## 2012-03-29 MED ORDER — HYDROCODONE-ACETAMINOPHEN 5-325 MG PO TABS: 1.0000 | ORAL_TABLET | Freq: Four times a day (QID) | ORAL | Status: AC | PRN
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
## 2012-03-29 NOTE — Discharge Instructions (Signed)
Continue heat on your back , activity as tolerated, see your doctor if further problems.

## 2012-05-15 ENCOUNTER — Emergency Department (HOSPITAL_COMMUNITY)
Admission: EM | Admit: 2012-05-15 | Discharge: 2012-05-15 | Disposition: A | Discharge status: Home / Self Care | Payer: Self-pay | Attending: Emergency Medicine | Admitting: Emergency Medicine

## 2012-05-15 ENCOUNTER — Encounter (HOSPITAL_COMMUNITY): Payer: Self-pay | Admitting: Physical Medicine and Rehabilitation

## 2012-05-15 DIAGNOSIS — B029 Zoster without complications: Secondary | ICD-10-CM | POA: Insufficient documentation

## 2012-05-15 DIAGNOSIS — M1990 Unspecified osteoarthritis, unspecified site: Secondary | ICD-10-CM | POA: Insufficient documentation

## 2012-05-15 DIAGNOSIS — K5090 Crohn's disease, unspecified, without complications: Secondary | ICD-10-CM | POA: Insufficient documentation

## 2012-05-15 DIAGNOSIS — G8929 Other chronic pain: Secondary | ICD-10-CM | POA: Insufficient documentation

## 2012-05-15 LAB — URINALYSIS, ROUTINE W REFLEX MICROSCOPIC
Bilirubin Urine: NEGATIVE
Glucose, UA: NEGATIVE mg/dL
Hgb urine dipstick: NEGATIVE
Ketones, ur: NEGATIVE mg/dL
Leukocytes, UA: NEGATIVE
Nitrite: NEGATIVE
Protein, ur: NEGATIVE mg/dL
Specific Gravity, Urine: 1.023 (ref 1.005–1.030)
Urobilinogen, UA: 0.2 mg/dL (ref 0.0–1.0)
pH: 6.5 (ref 5.0–8.0)

## 2012-05-15 MED ORDER — ACYCLOVIR 800 MG PO TABS: 800.0000 mg | ORAL_TABLET | Freq: Every day | ORAL | Status: AC

## 2012-05-15 MED ORDER — OXYCODONE-ACETAMINOPHEN 5-325 MG PO TABS
2.0000 | ORAL_TABLET | Freq: Once | ORAL | Status: AC
  Administered 2012-05-15: 2 via ORAL
  Filled 2012-05-15: qty 2

## 2012-05-15 MED ORDER — ACYCLOVIR 800 MG PO TABS
800.0000 mg | ORAL_TABLET | Freq: Once | ORAL | Status: AC
  Administered 2012-05-15: 800 mg via ORAL
  Filled 2012-05-15: qty 1

## 2012-05-15 NOTE — ED Notes (Addendum)
Pt states lower back pain x2 days. Now states pain is getting worse. Also states urinary frequency and dark colored urine. 10/10 pain at the time. Denies recent injury to back. She is conscious alert and oriented x4. Pt also states rash to lower abdomen and R side of back, ongoing for several days. Large raised patches noted to lower abdomen and R flank area. Pt states areas are painful to touch.

## 2012-05-15 NOTE — ED Provider Notes (Signed)
Medical screening examination/treatment/procedure(s) were performed by non-physician practitioner and as supervising physician I was immediately available for consultation/collaboration.   Gavin Pound. Nizar Cutler, MD 05/15/12 1154

## 2012-05-15 NOTE — ED Notes (Signed)
Pt presents to department for evaluation of lower back pain. Ongoing x2 days. States pain became worse today, rating 10/10. Also states urinary frequency, dark colored urine and nausea. Pt states history of kidney stones. He is conscious alert and oriented x4.
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
## 2012-05-22 ENCOUNTER — Emergency Department (HOSPITAL_COMMUNITY)
Admission: EM | Admit: 2012-05-22 | Discharge: 2012-05-22 | Disposition: A | Discharge status: Home / Self Care | Payer: Self-pay | Attending: Emergency Medicine | Admitting: Emergency Medicine

## 2012-05-22 ENCOUNTER — Encounter (HOSPITAL_COMMUNITY): Payer: Self-pay | Admitting: *Deleted

## 2012-05-22 DIAGNOSIS — K5090 Crohn's disease, unspecified, without complications: Secondary | ICD-10-CM | POA: Insufficient documentation

## 2012-05-22 DIAGNOSIS — L259 Unspecified contact dermatitis, unspecified cause: Secondary | ICD-10-CM | POA: Insufficient documentation

## 2012-05-22 DIAGNOSIS — M1990 Unspecified osteoarthritis, unspecified site: Secondary | ICD-10-CM | POA: Insufficient documentation

## 2012-05-22 DIAGNOSIS — B029 Zoster without complications: Secondary | ICD-10-CM | POA: Insufficient documentation

## 2012-05-22 DIAGNOSIS — J45909 Unspecified asthma, uncomplicated: Secondary | ICD-10-CM | POA: Insufficient documentation

## 2012-05-22 MED ORDER — HYDROCORTISONE 1 % EX CREA: TOPICAL_CREAM | CUTANEOUS | Status: DC

## 2012-05-22 MED ORDER — DIPHENHYDRAMINE HCL 25 MG PO TABS: 25.0000 mg | ORAL_TABLET | Freq: Four times a day (QID) | ORAL | Status: DC

## 2012-05-22 NOTE — ED Notes (Signed)
Pt reports that she had to go somewhere and that she will be back in thirty minutes.

## 2012-05-22 NOTE — ED Notes (Signed)
Pt reports being diagnosed recently with shingles, has new outbreak of rash to right upper arm and elbow, no relief with meds at home.
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
## 2012-05-24 ENCOUNTER — Encounter (HOSPITAL_COMMUNITY): Payer: Self-pay | Admitting: Emergency Medicine

## 2012-05-24 ENCOUNTER — Emergency Department (HOSPITAL_COMMUNITY)
Admission: EM | Admit: 2012-05-24 | Discharge: 2012-05-24 | Disposition: A | Discharge status: Home / Self Care | Payer: Self-pay | Attending: Emergency Medicine | Admitting: Emergency Medicine

## 2012-05-24 DIAGNOSIS — IMO0002 Reserved for concepts with insufficient information to code with codable children: Secondary | ICD-10-CM | POA: Insufficient documentation

## 2012-05-24 DIAGNOSIS — R21 Rash and other nonspecific skin eruption: Secondary | ICD-10-CM | POA: Insufficient documentation

## 2012-05-24 DIAGNOSIS — J45909 Unspecified asthma, uncomplicated: Secondary | ICD-10-CM | POA: Insufficient documentation

## 2012-05-24 DIAGNOSIS — G8929 Other chronic pain: Secondary | ICD-10-CM | POA: Insufficient documentation

## 2012-05-24 DIAGNOSIS — B029 Zoster without complications: Secondary | ICD-10-CM | POA: Insufficient documentation

## 2012-05-24 DIAGNOSIS — K5090 Crohn's disease, unspecified, without complications: Secondary | ICD-10-CM | POA: Insufficient documentation

## 2012-05-24 MED ORDER — PREDNISONE 10 MG PO TABS: 20.0000 mg | ORAL_TABLET | Freq: Two times a day (BID) | ORAL | Status: DC

## 2012-05-24 MED ORDER — ACYCLOVIR 400 MG PO TABS: 800.0000 mg | ORAL_TABLET | Freq: Every day | ORAL | Status: AC

## 2012-05-24 NOTE — ED Notes (Signed)
Pt denies pain, pt c/o constant itching to bilateral upper &lower extremities

## 2012-05-24 NOTE — ED Notes (Signed)
Pt. Stated, i was told i have shingles and i took medicine for it and its back and was given a Rx it cleared up some but not all
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
## 2012-08-06 ENCOUNTER — Emergency Department (HOSPITAL_COMMUNITY)
Admission: EM | Admit: 2012-08-06 | Discharge: 2012-08-07 | Disposition: A | Discharge status: Home / Self Care | Payer: Self-pay | Attending: Emergency Medicine | Admitting: Emergency Medicine

## 2012-08-06 ENCOUNTER — Encounter (HOSPITAL_COMMUNITY): Payer: Self-pay | Admitting: *Deleted

## 2012-08-06 DIAGNOSIS — R0982 Postnasal drip: Secondary | ICD-10-CM | POA: Insufficient documentation

## 2012-08-06 DIAGNOSIS — R070 Pain in throat: Secondary | ICD-10-CM | POA: Insufficient documentation

## 2012-08-06 DIAGNOSIS — Z79899 Other long term (current) drug therapy: Secondary | ICD-10-CM | POA: Insufficient documentation

## 2012-08-06 DIAGNOSIS — J309 Allergic rhinitis, unspecified: Secondary | ICD-10-CM | POA: Insufficient documentation

## 2012-08-06 DIAGNOSIS — Z882 Allergy status to sulfonamides status: Secondary | ICD-10-CM | POA: Insufficient documentation

## 2012-08-06 NOTE — ED Notes (Addendum)
C/o c/o sore (burning) throat onset tonight, also reports acid reflux for 2 months, has tried tums, mentions frequent post nasal drainage and clearing throat. persistantly clearing throat in triage.  Voice hoarse. Also HA.  Throat red irritated and swollen with no obvious exudate. LS CTA.

## 2012-08-07 ENCOUNTER — Encounter (HOSPITAL_COMMUNITY): Payer: Self-pay | Admitting: Emergency Medicine

## 2012-08-07 ENCOUNTER — Emergency Department (HOSPITAL_COMMUNITY)
Admission: EM | Admit: 2012-08-07 | Discharge: 2012-08-07 | Disposition: A | Discharge status: Home / Self Care | Payer: Self-pay | Attending: Emergency Medicine | Admitting: Emergency Medicine

## 2012-08-07 DIAGNOSIS — J45909 Unspecified asthma, uncomplicated: Secondary | ICD-10-CM | POA: Insufficient documentation

## 2012-08-07 DIAGNOSIS — IMO0002 Reserved for concepts with insufficient information to code with codable children: Secondary | ICD-10-CM | POA: Insufficient documentation

## 2012-08-07 DIAGNOSIS — J329 Chronic sinusitis, unspecified: Secondary | ICD-10-CM | POA: Insufficient documentation

## 2012-08-07 LAB — RAPID STREP SCREEN (MED CTR MEBANE ONLY): Streptococcus, Group A Screen (Direct): NEGATIVE

## 2012-08-07 MED ORDER — ALBUTEROL SULFATE HFA 108 (90 BASE) MCG/ACT IN AERS
2.0000 | INHALATION_SPRAY | Freq: Once | RESPIRATORY_TRACT | Status: AC
  Administered 2012-08-07: 2 via RESPIRATORY_TRACT
  Filled 2012-08-07: qty 6.7

## 2012-08-07 MED ORDER — PENICILLIN G BENZATHINE 1200000 UNIT/2ML IM SUSP
1.2000 10*6.[IU] | Freq: Once | INTRAMUSCULAR | Status: AC
  Administered 2012-08-07: 1.2 10*6.[IU] via INTRAMUSCULAR
  Filled 2012-08-07: qty 2
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
## 2012-08-07 NOTE — ED Notes (Signed)
Pt presents w/ sore throat, nasal congestion, post nasal drip, facial pain, headaches for weeks. Came today d/t how much her throat hurt her today.

## 2012-08-07 NOTE — ED Notes (Signed)
Patient standing outside of room.  Asked patient if she needs any help.  Patient states that nurse practioner was in the room 30 minutes ago and has not returned for strep screen/re-evaluation.  Informed patient that I will collect the strep screen.  Strep screen done and sent off.  Informed patient that it takes about 30-45 minutes for results to come back.  Will continue to monitor.

## 2012-08-07 NOTE — ED Notes (Signed)
Pt was provided with a contact list for medical follow up. Care Manager also provided an Rx discount card
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
## 2012-08-07 NOTE — ED Notes (Signed)
Patient standing outside room again; asked patient if she needs any assistance.  Patient states that she is ready to go; informed patient that we are currently waiting on disposition from NP.  Will continue to monitor.

## 2012-08-07 NOTE — ED Notes (Signed)
Gave discharge paperwork to patient; patient upset stating that she wants pain medication for her throat.  Patient informed that she can take Tylenol or ibuprofen for the pain.  Instructed to use salt water gargles for throat discomfort.  Patient asking to speak to the "head woman" because we have done nothing for her.  Patient states, "I came here for a problem.  A reason.  And, you didn't do anything for it".  Offered patient to speak to charge nurse; patient upset stating that she does not want to charge nurse, she wants the head women.  Patient instructed that she can call back to the ED for [facility], the director of the department.  Patient states, "I am a grown woman.  You guys have treated me like I'm a child.  I'm old enough to be your mother.  I will call up here and have both your job and that nurse practitioner's job".  Patient ripped up discharge paperwork, threw them on the floor, and yelled, "Now, show me the way out".  Patient shown how to get to the discharge station.

## 2012-08-08 NOTE — ED Provider Notes (Signed)
Medical screening examination/treatment/procedure(s) were performed by non-physician practitioner and as supervising physician I was immediately available for consultation/collaboration.    Johnna Acosta, MD 08/08/12 [phone]

## 2012-08-09 NOTE — Care Management Note (Signed)
    Page 1 of 1   08/07/2012     4:19:22 PM   CARE MANAGEMENT NOTE 08/07/2012  Patient:  Sheila Vincent, Sheila Vincent   Account Number:  [id number]  Date Initiated:  08/07/2012  Documentation initiated by:  Konrad Felix  Subjective/Objective Assessment:   Patient seen multiple times again for sinusitis and wants medications to be given to her.     Action/Plan:   Gave patient indigent PCP list and a community discount card.   Anticipated DC Date:  08/07/2012   Anticipated DC Plan:  HOME/SELF CARE      DC Planning Services  CM consult      Choice offered to / List presented to:             Status of service:  Completed, signed off Medicare Important Message given?   (If response is "NO", the following Medicare IM given date fields will be blank) Date Medicare IM given:   Date Additional Medicare IM given:    Discharge Disposition:  HOME/SELF CARE  Per UR Regulation:    If discussed at Long Length of Stay Meetings, dates discussed:    Comments:

## 2012-08-10 NOTE — ED Provider Notes (Signed)
Medical screening examination/treatment/procedure(s) were performed by non-physician practitioner and as supervising physician I was immediately available for consultation/collaboration.  Virgel Manifold, MD 08/10/12 [phone]

## 2012-11-14 ENCOUNTER — Encounter (HOSPITAL_COMMUNITY): Payer: Self-pay | Admitting: Emergency Medicine

## 2012-11-14 ENCOUNTER — Emergency Department (HOSPITAL_COMMUNITY)
Admission: EM | Admit: 2012-11-14 | Discharge: 2012-11-14 | Disposition: A | Discharge status: Home / Self Care | Payer: Self-pay | Attending: Emergency Medicine | Admitting: Emergency Medicine

## 2012-11-14 ENCOUNTER — Emergency Department (HOSPITAL_COMMUNITY): Payer: Self-pay

## 2012-11-14 DIAGNOSIS — M1990 Unspecified osteoarthritis, unspecified site: Secondary | ICD-10-CM | POA: Insufficient documentation

## 2012-11-14 DIAGNOSIS — J3489 Other specified disorders of nose and nasal sinuses: Secondary | ICD-10-CM | POA: Insufficient documentation

## 2012-11-14 DIAGNOSIS — J069 Acute upper respiratory infection, unspecified: Secondary | ICD-10-CM | POA: Insufficient documentation

## 2012-11-14 DIAGNOSIS — IMO0002 Reserved for concepts with insufficient information to code with codable children: Secondary | ICD-10-CM | POA: Insufficient documentation

## 2012-11-14 DIAGNOSIS — Z79899 Other long term (current) drug therapy: Secondary | ICD-10-CM | POA: Insufficient documentation

## 2012-11-14 DIAGNOSIS — M519 Unspecified thoracic, thoracolumbar and lumbosacral intervertebral disc disorder: Secondary | ICD-10-CM | POA: Insufficient documentation

## 2012-11-14 DIAGNOSIS — J45909 Unspecified asthma, uncomplicated: Secondary | ICD-10-CM | POA: Insufficient documentation

## 2012-11-14 DIAGNOSIS — K5090 Crohn's disease, unspecified, without complications: Secondary | ICD-10-CM | POA: Insufficient documentation

## 2012-11-14 DIAGNOSIS — G8929 Other chronic pain: Secondary | ICD-10-CM | POA: Insufficient documentation

## 2012-11-14 LAB — BASIC METABOLIC PANEL
BUN: 10 mg/dL (ref 6–23)
CO2: 25 mEq/L (ref 19–32)
Calcium: 9.5 mg/dL (ref 8.4–10.5)
Chloride: 99 mEq/L (ref 96–112)
Creatinine, Ser: 0.66 mg/dL (ref 0.50–1.10)
GFR calc Af Amer: >90 mL/min (ref >90)
GFR calc non Af Amer: >90 mL/min (ref >90)
Glucose, Bld: 140 mg/dL — ABNORMAL HIGH (ref 70–99)
Potassium: 3.7 mEq/L (ref 3.5–5.1)
Sodium: 135 mEq/L (ref 135–145)

## 2012-11-14 LAB — CBC WITH DIFFERENTIAL/PLATELET
Basophils Absolute: 0 10*3/uL (ref 0.0–0.1)
Basophils Relative: 0 % (ref 0–1)
Eosinophils Absolute: 0 10*3/uL (ref 0.0–0.7)
Eosinophils Relative: 0 % (ref 0–5)
HCT: 43.3 % (ref 36.0–46.0)
Hemoglobin: 14.3 g/dL (ref 12.0–15.0)
Lymphocytes Relative: 7 % — ABNORMAL LOW (ref 12–46)
Lymphs Abs: 0.8 10*3/uL (ref 0.7–4.0)
MCH: 27.7 pg (ref 26.0–34.0)
MCHC: 33 g/dL (ref 30.0–36.0)
MCV: 83.8 fL (ref 78.0–100.0)
Monocytes Absolute: 0.8 10*3/uL (ref 0.1–1.0)
Monocytes Relative: 6 % (ref 3–12)
Neutro Abs: 10.7 10*3/uL — ABNORMAL HIGH (ref 1.7–7.7)
Neutrophils Relative %: 87 % — ABNORMAL HIGH (ref 43–77)
Platelets: 327 10*3/uL (ref 150–400)
RBC: 5.17 MIL/uL — ABNORMAL HIGH (ref 3.87–5.11)
RDW: 13.5 % (ref 11.5–15.5)
WBC: 12.3 10*3/uL — ABNORMAL HIGH (ref 4.0–10.5)

## 2012-11-14 LAB — RAPID STREP SCREEN (MED CTR MEBANE ONLY): Streptococcus, Group A Screen (Direct): NEGATIVE

## 2012-11-14 MED ORDER — AZITHROMYCIN 250 MG PO TABS: 250.0000 mg | ORAL_TABLET | Freq: Every day | ORAL | Status: DC

## 2012-11-14 MED ORDER — ALBUTEROL SULFATE HFA 108 (90 BASE) MCG/ACT IN AERS
1.0000 | INHALATION_SPRAY | Freq: Four times a day (QID) | RESPIRATORY_TRACT | Status: DC | PRN
  Administered 2012-11-14: 1 via RESPIRATORY_TRACT
  Filled 2012-11-14: qty 6.7
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
## 2012-12-28 ENCOUNTER — Other Ambulatory Visit: Payer: Self-pay | Admitting: Internal Medicine

## 2012-12-28 DIAGNOSIS — M545 Low back pain: Secondary | ICD-10-CM

## 2012-12-28 DIAGNOSIS — M549 Dorsalgia, unspecified: Secondary | ICD-10-CM

## 2012-12-28 DIAGNOSIS — M519 Unspecified thoracic, thoracolumbar and lumbosacral intervertebral disc disorder: Secondary | ICD-10-CM

## 2012-12-28 DIAGNOSIS — M5137 Other intervertebral disc degeneration, lumbosacral region: Secondary | ICD-10-CM

## 2013-01-05 ENCOUNTER — Other Ambulatory Visit: Payer: Self-pay

## 2013-01-07 ENCOUNTER — Other Ambulatory Visit: Payer: Self-pay

## 2013-01-13 ENCOUNTER — Emergency Department (HOSPITAL_COMMUNITY): Payer: Self-pay

## 2013-01-13 ENCOUNTER — Encounter (HOSPITAL_COMMUNITY): Payer: Self-pay | Admitting: *Deleted

## 2013-01-13 ENCOUNTER — Emergency Department (HOSPITAL_COMMUNITY)
Admission: EM | Admit: 2013-01-13 | Discharge: 2013-01-14 | Disposition: A | Discharge status: Home / Self Care | Payer: Self-pay | Attending: Emergency Medicine | Admitting: Emergency Medicine

## 2013-01-13 DIAGNOSIS — K625 Hemorrhage of anus and rectum: Secondary | ICD-10-CM | POA: Insufficient documentation

## 2013-01-13 DIAGNOSIS — K5090 Crohn's disease, unspecified, without complications: Secondary | ICD-10-CM | POA: Insufficient documentation

## 2013-01-13 DIAGNOSIS — M549 Dorsalgia, unspecified: Secondary | ICD-10-CM | POA: Insufficient documentation

## 2013-01-13 DIAGNOSIS — K5010 Crohn's disease of large intestine without complications: Secondary | ICD-10-CM

## 2013-01-13 DIAGNOSIS — J45909 Unspecified asthma, uncomplicated: Secondary | ICD-10-CM | POA: Insufficient documentation

## 2013-01-13 DIAGNOSIS — Z8739 Personal history of other diseases of the musculoskeletal system and connective tissue: Secondary | ICD-10-CM | POA: Insufficient documentation

## 2013-01-13 DIAGNOSIS — G8929 Other chronic pain: Secondary | ICD-10-CM | POA: Insufficient documentation

## 2013-01-13 DIAGNOSIS — IMO0002 Reserved for concepts with insufficient information to code with codable children: Secondary | ICD-10-CM | POA: Insufficient documentation

## 2013-01-13 LAB — COMPREHENSIVE METABOLIC PANEL
ALT: 18 U/L (ref 0–35)
AST: 17 U/L (ref 0–37)
Albumin: 3.8 g/dL (ref 3.5–5.2)
Alkaline Phosphatase: 133 U/L — ABNORMAL HIGH (ref 39–117)
BUN: 10 mg/dL (ref 6–23)
CO2: 27 mEq/L (ref 19–32)
Calcium: 9 mg/dL (ref 8.4–10.5)
Chloride: 97 mEq/L (ref 96–112)
Creatinine, Ser: 0.71 mg/dL (ref 0.50–1.10)
GFR calc Af Amer: >90 mL/min (ref >90)
GFR calc non Af Amer: >90 mL/min (ref >90)
Glucose, Bld: 213 mg/dL — ABNORMAL HIGH (ref 70–99)
Potassium: 3.8 mEq/L (ref 3.5–5.1)
Sodium: 136 mEq/L (ref 135–145)
Total Bilirubin: 0.2 mg/dL — ABNORMAL LOW (ref 0.3–1.2)
Total Protein: 7.7 g/dL (ref 6.0–8.3)

## 2013-01-13 LAB — CBC WITH DIFFERENTIAL/PLATELET
Basophils Absolute: 0 10*3/uL (ref 0.0–0.1)
Basophils Relative: 0 % (ref 0–1)
Eosinophils Absolute: 0.1 10*3/uL (ref 0.0–0.7)
Eosinophils Relative: 1 % (ref 0–5)
HCT: 41.3 % (ref 36.0–46.0)
Hemoglobin: 13.4 g/dL (ref 12.0–15.0)
Lymphocytes Relative: 16 % (ref 12–46)
Lymphs Abs: 1.9 10*3/uL (ref 0.7–4.0)
MCH: 27.1 pg (ref 26.0–34.0)
MCHC: 32.4 g/dL (ref 30.0–36.0)
MCV: 83.4 fL (ref 78.0–100.0)
Monocytes Absolute: 0.6 10*3/uL (ref 0.1–1.0)
Monocytes Relative: 6 % (ref 3–12)
Neutro Abs: 8.9 10*3/uL — ABNORMAL HIGH (ref 1.7–7.7)
Neutrophils Relative %: 77 % (ref 43–77)
Platelets: 356 10*3/uL (ref 150–400)
RBC: 4.95 MIL/uL (ref 3.87–5.11)
RDW: 13.5 % (ref 11.5–15.5)
WBC: 11.6 10*3/uL — ABNORMAL HIGH (ref 4.0–10.5)

## 2013-01-13 LAB — URINALYSIS, ROUTINE W REFLEX MICROSCOPIC
Bilirubin Urine: NEGATIVE
Glucose, UA: 100 mg/dL — AB
Hgb urine dipstick: NEGATIVE
Ketones, ur: NEGATIVE mg/dL
Leukocytes, UA: NEGATIVE
Nitrite: NEGATIVE
Protein, ur: NEGATIVE mg/dL
Specific Gravity, Urine: 1.026 (ref 1.005–1.030)
Urobilinogen, UA: 0.2 mg/dL (ref 0.0–1.0)
pH: 6.5 (ref 5.0–8.0)

## 2013-01-13 LAB — LIPASE, BLOOD: Lipase: 30 U/L (ref 11–59)

## 2013-01-13 MED ORDER — IOHEXOL 300 MG/ML  SOLN
100.0000 mL | Freq: Once | INTRAMUSCULAR | Status: AC | PRN
  Administered 2013-01-13: 100 mL via INTRAVENOUS

## 2013-01-13 MED ORDER — SODIUM CHLORIDE 0.9 % IV SOLN
INTRAVENOUS | Status: DC
  Administered 2013-01-13: 22:00:00 via INTRAVENOUS

## 2013-01-13 MED ORDER — IOHEXOL 300 MG/ML  SOLN
50.0000 mL | Freq: Once | INTRAMUSCULAR | Status: AC | PRN
  Administered 2013-01-13: 50 mL via ORAL
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
## 2013-01-13 NOTE — ED Notes (Signed)
Pt ambulated to BR without assistance

## 2013-01-13 NOTE — ED Notes (Signed)
Pt back from CT

## 2013-01-13 NOTE — ED Notes (Signed)
Pt ambulated to CT at this time.
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
## 2013-01-14 MED ORDER — CIPROFLOXACIN HCL 500 MG PO TABS: 500.0000 mg | ORAL_TABLET | Freq: Two times a day (BID) | ORAL | Status: DC

## 2013-01-14 MED ORDER — METRONIDAZOLE 500 MG PO TABS: 500.0000 mg | ORAL_TABLET | Freq: Two times a day (BID) | ORAL | Status: DC

## 2013-01-14 MED ORDER — METHYLPREDNISOLONE SODIUM SUCC 125 MG IJ SOLR: 125.0000 mg | Freq: Once | INTRAMUSCULAR | Status: DC

## 2013-01-14 MED ORDER — PREDNISONE 20 MG PO TABS: ORAL_TABLET | ORAL | Status: DC

## 2013-01-20 ENCOUNTER — Ambulatory Visit
Admission: RE | Admit: 2013-01-20 | Discharge: 2013-01-20 | Disposition: A | Discharge status: Home / Self Care | Payer: PRIVATE HEALTH INSURANCE | Source: Ambulatory Visit | Attending: Internal Medicine | Admitting: Internal Medicine

## 2013-01-20 DIAGNOSIS — M5137 Other intervertebral disc degeneration, lumbosacral region: Secondary | ICD-10-CM

## 2013-01-20 DIAGNOSIS — M545 Low back pain: Secondary | ICD-10-CM

## 2013-01-20 DIAGNOSIS — M549 Dorsalgia, unspecified: Secondary | ICD-10-CM

## 2013-01-20 DIAGNOSIS — M519 Unspecified thoracic, thoracolumbar and lumbosacral intervertebral disc disorder: Secondary | ICD-10-CM

## 2013-01-20 MED ORDER — GADOBENATE DIMEGLUMINE 529 MG/ML IV SOLN
15.0000 mL | Freq: Once | INTRAVENOUS | Status: AC | PRN
  Administered 2013-01-20: 15 mL via INTRAVENOUS

## 2013-04-13 ENCOUNTER — Other Ambulatory Visit: Payer: Self-pay | Admitting: Internal Medicine

## 2013-04-13 DIAGNOSIS — M545 Low back pain: Secondary | ICD-10-CM

## 2013-04-26 ENCOUNTER — Other Ambulatory Visit: Payer: PRIVATE HEALTH INSURANCE

## 2013-05-01 ENCOUNTER — Other Ambulatory Visit: Payer: PRIVATE HEALTH INSURANCE

## 2013-05-01 ENCOUNTER — Ambulatory Visit
Admission: RE | Admit: 2013-05-01 | Discharge: 2013-05-01 | Disposition: A | Discharge status: Home / Self Care | Payer: PRIVATE HEALTH INSURANCE | Source: Ambulatory Visit | Attending: Internal Medicine | Admitting: Internal Medicine

## 2013-05-01 DIAGNOSIS — M545 Low back pain: Secondary | ICD-10-CM

## 2014-07-06 ENCOUNTER — Encounter: Payer: Self-pay | Admitting: Neurology

## 2014-07-06 ENCOUNTER — Ambulatory Visit (INDEPENDENT_AMBULATORY_CARE_PROVIDER_SITE_OTHER): Payer: Medicare HMO | Admitting: Neurology

## 2014-07-06 VITALS — BP 153/87 | HR 81 | Ht 63.0 in | Wt 159.0 lb

## 2014-07-06 DIAGNOSIS — M545 Low back pain, unspecified: Secondary | ICD-10-CM | POA: Insufficient documentation

## 2014-07-06 DIAGNOSIS — M79609 Pain in unspecified limb: Secondary | ICD-10-CM

## 2014-07-06 DIAGNOSIS — M79671 Pain in right foot: Secondary | ICD-10-CM | POA: Insufficient documentation

## 2014-07-06 MED ORDER — GABAPENTIN 300 MG PO CAPS: 300.0000 mg | ORAL_CAPSULE | Freq: Three times a day (TID) | ORAL | Status: DC
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
## 2014-07-21 ENCOUNTER — Encounter: Payer: Commercial Managed Care - HMO | Admitting: Neurology

## 2014-07-24 ENCOUNTER — Encounter: Payer: Medicare HMO | Admitting: Neurology

## 2014-07-26 ENCOUNTER — Ambulatory Visit (INDEPENDENT_AMBULATORY_CARE_PROVIDER_SITE_OTHER): Payer: Medicare HMO | Admitting: Neurology

## 2014-07-26 ENCOUNTER — Encounter (INDEPENDENT_AMBULATORY_CARE_PROVIDER_SITE_OTHER): Payer: Self-pay

## 2014-07-26 DIAGNOSIS — M545 Low back pain, unspecified: Secondary | ICD-10-CM

## 2014-07-26 DIAGNOSIS — M79671 Pain in right foot: Secondary | ICD-10-CM

## 2014-07-26 DIAGNOSIS — Z0289 Encounter for other administrative examinations: Secondary | ICD-10-CM

## 2014-07-26 DIAGNOSIS — M79609 Pain in unspecified limb: Secondary | ICD-10-CM

## 2014-07-26 NOTE — Procedures (Signed)
   NCS (NERVE CONDUCTION STUDY) WITH EMG (ELECTROMYOGRAPHY) REPORT   STUDY DATE: Sept 23 2015 PATIENT NAME: Sheila Vincent DOB: 1961-06-05 MRN: [id number]    TECHNOLOGIST: Laretta Alstrom ELECTROMYOGRAPHER: Marcial Pacas M.D.  CLINICAL INFORMATION:  53 years old female, presenting with two-month history of right first, and second toe numbness, tenderness upon deep palpation  FINDINGS: NERVE CONDUCTION STUDY: Bilateral peroneal sensory responses were normal. Bilateral peroneal, tibial motor responses were normal. Bilateral tibial H. reflexes were normal and symmetric.  NEEDLE ELECTROMYOGRAPHY: Selected needle examination was performed at right lower extremity muscles, right lumbosacral paraspinal muscles  Needle examination of right abductor pollicis longus, tibialis anterior, tibialis posterior, medial gastrocnemius, peroneal longus, vastus lateralis was normal  There was no spontaneous activity at right lumbosacral paraspinal muscles, right L4, L5, S1.  IMPRESSION:   This is a normal study. There was no electrodiagnostic evidence of a large fiber peripheral neuropathy, right lower extremity neuropathy, or right lumbosacral radiculopathy.   INTERPRETING PHYSICIAN:   Marcial Pacas M.D. Ph.D. [facility] [address], [place], [place] [address] [phone]

## 2014-08-02 ENCOUNTER — Telehealth: Payer: Self-pay | Admitting: Neurology

## 2014-08-02 NOTE — Telephone Encounter (Signed)
Earlene Plater with [facility] @ [phone], received referral for patient.  Unable to schedule appointment due to patient has unfinished business with practice.

## 2014-08-03 NOTE — Telephone Encounter (Signed)
Pt's information was faxed to [facility], confirmation received.

## 2014-08-14 ENCOUNTER — Encounter: Payer: Medicare HMO | Admitting: Podiatry

## 2014-08-14 ENCOUNTER — Ambulatory Visit: Payer: Self-pay

## 2014-08-14 DIAGNOSIS — M79673 Pain in unspecified foot: Secondary | ICD-10-CM

## 2014-08-21 NOTE — Progress Notes (Signed)
Patient cancelled appointment.

## 2014-09-04 ENCOUNTER — Ambulatory Visit: Payer: Medicare HMO | Admitting: Podiatry

## 2014-09-25 ENCOUNTER — Ambulatory Visit: Payer: Medicare HMO | Admitting: Podiatry

## 2014-11-08 ENCOUNTER — Ambulatory Visit: Payer: Medicare HMO | Admitting: Neurology

## 2014-11-16 ENCOUNTER — Encounter: Payer: Self-pay | Admitting: Gastroenterology

## 2014-12-06 ENCOUNTER — Ambulatory Visit (INDEPENDENT_AMBULATORY_CARE_PROVIDER_SITE_OTHER)
Admission: RE | Admit: 2014-12-06 | Discharge: 2014-12-06 | Disposition: A | Discharge status: Home / Self Care | Payer: Medicare HMO | Source: Ambulatory Visit | Attending: Internal Medicine | Admitting: Internal Medicine

## 2014-12-06 ENCOUNTER — Ambulatory Visit (INDEPENDENT_AMBULATORY_CARE_PROVIDER_SITE_OTHER): Payer: Medicare HMO | Admitting: Internal Medicine

## 2014-12-06 ENCOUNTER — Encounter: Payer: Self-pay | Admitting: Internal Medicine

## 2014-12-06 ENCOUNTER — Other Ambulatory Visit (INDEPENDENT_AMBULATORY_CARE_PROVIDER_SITE_OTHER): Payer: Medicare HMO

## 2014-12-06 ENCOUNTER — Telehealth: Payer: Self-pay | Admitting: Internal Medicine

## 2014-12-06 VITALS — BP 118/90 | HR 89 | Temp 98.0°F | Ht 64.0 in | Wt 158.0 lb

## 2014-12-06 DIAGNOSIS — R05 Cough: Secondary | ICD-10-CM

## 2014-12-06 DIAGNOSIS — R058 Other specified cough: Secondary | ICD-10-CM

## 2014-12-06 LAB — CBC WITH DIFFERENTIAL/PLATELET
Basophils Absolute: 0 10*3/uL (ref 0.0–0.1)
Basophils Relative: 0.4 % (ref 0.0–3.0)
Eosinophils Absolute: 0.1 10*3/uL (ref 0.0–0.7)
Eosinophils Relative: 1.2 % (ref 0.0–5.0)
HCT: 38.3 % (ref 36.0–46.0)
Hemoglobin: 12.8 g/dL (ref 12.0–15.0)
Lymphocytes Relative: 19.2 % (ref 12.0–46.0)
Lymphs Abs: 2 10*3/uL (ref 0.7–4.0)
MCHC: 33.4 g/dL (ref 30.0–36.0)
MCV: 77.5 fl — ABNORMAL LOW (ref 78.0–100.0)
Monocytes Absolute: 0.7 10*3/uL (ref 0.1–1.0)
Monocytes Relative: 6.7 % (ref 3.0–12.0)
Neutro Abs: 7.7 10*3/uL (ref 1.4–7.7)
Neutrophils Relative %: 72.5 % (ref 43.0–77.0)
Platelets: 354 10*3/uL (ref 150.0–400.0)
RBC: 4.94 Mil/uL (ref 3.87–5.11)
RDW: 14.4 % (ref 11.5–15.5)
WBC: 10.6 10*3/uL — ABNORMAL HIGH (ref 4.0–10.5)

## 2014-12-06 MED ORDER — GABAPENTIN 100 MG PO CAPS: 100.0000 mg | ORAL_CAPSULE | Freq: Three times a day (TID) | ORAL | Status: DC

## 2014-12-06 MED ORDER — PREDNISONE 10 MG PO TABS: ORAL_TABLET | ORAL | Status: DC

## 2014-12-06 NOTE — Assessment & Plan Note (Addendum)
- allergy profile 12/06/2014 >  IgE 61, min Pos RAST for cat   The most common causes of chronic cough in immunocompetent adults include the following: upper airway cough syndrome (UACS), previously referred to as postnasal drip syndrome (PNDS), which is caused by variety of rhinosinus conditions; (2) asthma; (3) GERD; (4) chronic bronchitis from cigarette smoking or other inhaled environmental irritants; (5) nonasthmatic eosinophilic bronchitis; and (6) bronchiectasis.   These conditions, singly or in combination, have accounted for up to 94% of the causes of chronic cough in prospective studies.   Other conditions have constituted no >6% of the causes in prospective studies These have included bronchogenic carcinoma, chronic interstitial pneumonia, sarcoidosis, left ventricular failure, ACEI-induced cough, and aspiration from a condition associated with pharyngeal dysfunction.    Chronic cough is often simultaneously caused by more than one condition. A single cause has been found from 38 to 82% of the time, multiple causes from 18 to 62%. Multiply caused cough has been the result of three diseases up to 42% of the time.       Based on hx and exam, this is most likely:  Classic Upper airway cough syndrome, so named because it's frequently impossible to sort out how much is  CR/sinusitis with freq throat clearing (which can be related to primary GERD)   vs  causing  secondary (" extra esophageal")  GERD from wide swings in gastric pressure that occur with throat clearing, often  promoting self use of mint and menthol lozenges that reduce the lower esophageal sphincter tone and exacerbate the problem further in a cyclical fashion.   These are the same pts (now being labeled as having "irritable larynx syndrome" by some cough centers) who not infrequently have a history of having failed to tolerate ace inhibitors,  dry powder inhalers or biphosphonates or report having atypical reflux symptoms that don't  respond to standard doses of PPI , and are easily confused as having aecopd or asthma flares by even experienced allergists/ pulmonologists.   The first step is to maximize acid suppression and eliminate cyclical coughing with neurontin then regroup in 2 weeks to take the next step:  Discussed with pt  The standardized cough guidelines published in Chest by Lissa Morales in 2006 are still the best available and consist of a multiple step process (up to 12!) , not a single office visit,  and are intended  to address this problem logically,  with an alogrithm dependent on response to empiric treatment at  each progressive step  to determine a specific diagnosis with  minimal addtional testing needed. Therefore if adherence is an issue or can't be accurately verified,  it's very unlikely the standard evaluation and treatment will be successful here.    Furthermore, response to therapy (other than acute cough suppression, which should only be used short term with avoidance of narcotic containing cough syrups if possible), can be a gradual process for which the patient may perceive immediate benefit.   We need to do a trust but verify ov before taking the next steps:  To keep things simple, I have asked the patient to first separate medicines that are perceived as maintenance, that is to be taken daily "no matter what", from those medicines that are taken on only on an as-needed basis and I have given the patient examples of both, and then return to see our NP to generate a  detailed  medication calendar which should be followed until the next physician sees the  patient and updates it.

## 2014-12-06 NOTE — Patient Instructions (Addendum)
Gabapentin 100 mg three times day   Stop clariton and for itching/ sneezing/drainage take chlortrimeton (chlorpheniramine) 4 mg every 4 hours available over the counter (may cause drowsiness)   Only use your albuterol as a rescue medication to be used if you can't catch your breath by resting or doing a relaxed purse lip breathing pattern.  - The less you use it, the better it will work when you need it. - Ok to use up to 2 puffs  every 4 hours if you must but call for immediate appointment if use goes up over your usual need - Don't leave home without it !!  (think of it like the spare tire for your car)   Prednisone 10 mg take  4 each am x 2 days,   2 each am x 2 days,  1 each am x 2 days and stop   Omeprazole  40 mg  Take 30- 60 min before your first and last meals of the day       GERD (REFLUX)  is an extremely common cause of respiratory symptoms just like yours , many times with no obvious heartburn at all.    It can be treated with medication, but also with lifestyle changes including avoidance of late meals, excessive alcohol, smoking cessation, and avoid fatty foods, chocolate, peppermint, colas, red wine, and acidic juices such as orange juice.  NO MINT OR MENTHOL PRODUCTS SO NO COUGH DROPS  USE SUGARLESS CANDY INSTEAD (Jolley ranchers or Stover's or Life Savers) or even ice chips will also do - the key is to swallow to prevent all throat clearing. NO OIL BASED VITAMINS - use powdered substitutes.    Please remember to go to the lab and x-ray department downstairs for your tests - we will call you with the results when they are available.  See Tammy NP w/in 2 weeks with all your medications, even over the counter meds, separated in two separate bags, the ones you take no matter what vs the ones you stop once you feel better and take only as needed when you feel you need them.   Tammy  will generate for you a new user friendly medication calendar that will put [place] all on the same page  re: your medication use.     Without this process, it simply isn't possible to assure that we are providing  your outpatient care  with  the attention to detail we feel you deserve.   If we cannot assure that you're getting that kind of care,  then we cannot manage your problem effectively from this clinic.  Once you have seen Tammy and we are sure that we're all on the same page with your medication use she will arrange follow up with me.

## 2014-12-06 NOTE — Progress Notes (Signed)
Quick Note:  LMTCB ______ 

## 2014-12-06 NOTE — Telephone Encounter (Signed)
Called and spoke with pt and she is aware of results per MW.  Nothing further is needed.  

## 2014-12-06 NOTE — Progress Notes (Addendum)
Subjective:    Patient ID: Sheila Vincent, female    DOB: 1961-04-14,     MRN: [id number]  HPI  54 yowf never smoker dx chrons dz age 54 then asthma around 2005 eval by allergist "allergic to everything" shots helped but could not afford assoc with sinus drainage eval by ENT ? GERD no better on Rx so referred by Dr Jonelle Sidle to pulmonary clinic p some improvement on prednisone worse off it 56/01/2015    12/06/2014 1st [facility] office visit/ [facility]   Chief Complaint  Patient presents with  . Pulmonary Consult    Referred by Dr. Alean Rinne. Pt c/o PND and cough for the past 6 months. Her cough is occ prod with green, bloody sputum. She also c/o occ SOB wiuth coughing spells and states "I already have asthma".  Prednisone has helped with her symptoms in the past.    day >> noct with urge to clear throat assoc with bad HB with sob mostly when coughing and saba not really helping/ not taking aggressive gerd rx   No obvious other patterns in day to day or daytime variabilty or assoc chronic cough or cp or chest tightness, subjective wheeze overt sinus  symptoms. No unusual exp hx or h/o childhood pna/ asthma or knowledge of premature birth.  Sleeping ok without nocturnal  or early am exacerbation  of respiratory  c/o's or need for noct saba. Also denies any obvious fluctuation of symptoms with weather or environmental changes or other aggravating or alleviating factors except as outlined above   Current Medications, Allergies, Complete Past Medical History, Past Surgical History, Family History, and Social History were reviewed in [employer] record.           Review of Systems  Constitutional: Negative for fever, chills and unexpected weight change.  HENT: Positive for sneezing, sore throat and voice change. Negative for congestion, dental problem, ear pain, nosebleeds, postnasal drip, rhinorrhea, sinus pressure and trouble swallowing.   Eyes: Negative for visual  disturbance.  Respiratory: Positive for cough and shortness of breath. Negative for choking.   Cardiovascular: Negative for chest pain and leg swelling.  Gastrointestinal: Negative for vomiting, abdominal pain and diarrhea.  Genitourinary: Negative for difficulty urinating.       Acid heartburn  Indigestion  Musculoskeletal: Positive for arthralgias.  Skin: Negative for rash.  Neurological: Negative for tremors, syncope and headaches.  Hematological: Does not bruise/bleed easily.       Objective:   Physical Exam amb wf nad, unusual affect, evasive with questions re symptoms  Wt Readings from Last 3 Encounters:  12/06/14 158 lb (71.668 kg)  07/06/14 159 lb (72.122 kg)    Vital signs reviewed  HEENT: nl dentition, turbinates, and orophanx. Nl external ear canals without cough reflex   NECK :  without JVD/Nodes/TM/ nl carotid upstrokes bilaterally   LUNGS: no acc muscle use, clear to A and P bilaterally without cough on insp or exp maneuvers   CV:  RRR  no s3 or murmur or increase in P2, no edema   ABD:  soft and nontender with nl excursion in the supine position. No bruits or organomegaly, bowel sounds nl  MS:  warm without deformities, calf tenderness, cyanosis or clubbing  SKIN: warm and dry without lesions    NEURO:  alert, approp, no deficits  CXR:  12/06/14  I personally reviewed images and agree with radiology impression as follows:  Lungs are clear. Heart size and pulmonary vascularity are  normal. No adenopathy. There is sclerosis along the anterior right first rib, a stable finding. There is mild anterior wedging of the L1 vertebral body, stable.  Labs oredred/reviewed  allergy profile/ cbc (see cough a/p)       Assessment & Plan:

## 2014-12-07 LAB — ALLERGY FULL PROFILE
Allergen, D pternoyssinus,d7: <0.1 kU/L
Allergen,Goose feathers, e70: <0.1 kU/L
Alternaria Alternata: <0.1 kU/L
Aspergillus fumigatus, m3: <0.1 kU/L
Bahia Grass: <0.1 kU/L
Bermuda Grass: <0.1 kU/L
Box Elder IgE: <0.1 kU/L
Candida Albicans: <0.1 kU/L
Cat Dander: 0.2 kU/L — ABNORMAL HIGH
Common Ragweed: <0.1 kU/L
Curvularia lunata: <0.1 kU/L
D. farinae: <0.1 kU/L
Dog Dander: <0.1 kU/L
Elm IgE: <0.1 kU/L
Fescue: <0.1 kU/L
G005 Rye, Perennial: <0.1 kU/L
G009 Red Top: <0.1 kU/L
Goldenrod: <0.1 kU/L
Helminthosporium halodes: <0.1 kU/L
House Dust Hollister: <0.1 kU/L
IgE (Immunoglobulin E), Serum: 61 kU/L (ref <115)
Lamb's Quarters: <0.1 kU/L
Oak: <0.1 kU/L
Plantain: <0.1 kU/L
Stemphylium Botryosum: <0.1 kU/L
Sycamore Tree: <0.1 kU/L
Timothy Grass: <0.1 kU/L

## 2014-12-08 ENCOUNTER — Encounter: Payer: Self-pay | Admitting: Internal Medicine

## 2014-12-11 ENCOUNTER — Telehealth: Payer: Self-pay | Admitting: *Deleted

## 2014-12-11 NOTE — Telephone Encounter (Signed)
LMTCB x 1 

## 2014-12-11 NOTE — Telephone Encounter (Signed)
Patients allergy profile shows she has a minimal allergy to Cats.  Patient has 4 cats.  She wants to know if she can take allergy shots or something to help her since she has cats around her all the time.  Please advise.

## 2014-12-11 NOTE — Telephone Encounter (Signed)
Maybe if we find a cause and effect relationship -will discuss specifics at next ov > in meantime best option is keep them out of the bedroom

## 2014-12-12 ENCOUNTER — Telehealth: Payer: Self-pay | Admitting: Internal Medicine

## 2014-12-12 NOTE — Telephone Encounter (Signed)
Pt aware of rec's per MW. Pt made aware of upcoming appt with TP on 2/18 - aware to bring all meds to appt.    Tanda Rockers, MD at 12/11/2014 12:22 PM     Status: Signed       Expand All Collapse All   Maybe if we find a cause and effect relationship -will discuss specifics at next ov > in meantime best option is keep them out of the bedroom             [place] at 12/11/2014 11:56 AM     Status: Signed       Expand All Collapse All   Patients allergy profile shows she has a minimal allergy to Cats. Patient has 4 cats. She wants to know if she can take allergy shots or something to help her since she has cats around her all the time. Please advise.

## 2014-12-21 ENCOUNTER — Ambulatory Visit (INDEPENDENT_AMBULATORY_CARE_PROVIDER_SITE_OTHER): Payer: Medicare HMO | Admitting: Adult Health

## 2014-12-21 ENCOUNTER — Encounter: Payer: Self-pay | Admitting: Adult Health

## 2014-12-21 VITALS — BP 124/76 | HR 80 | Temp 97.6°F | Ht 64.0 in | Wt 164.6 lb

## 2014-12-21 DIAGNOSIS — R05 Cough: Secondary | ICD-10-CM

## 2014-12-21 DIAGNOSIS — R058 Other specified cough: Secondary | ICD-10-CM

## 2014-12-21 MED ORDER — BENZONATATE 200 MG PO CAPS: 200.0000 mg | ORAL_CAPSULE | Freq: Three times a day (TID) | ORAL | Status: DC | PRN

## 2014-12-21 NOTE — Patient Instructions (Signed)
Follow med calendar closely and bring to each visit May begin Delsym 2 teaspoons twice daily as needed for cough and throat clearing  Tessalon 3 times a day as needed for cough and throat clearing May restart chlor tabs 4 mg 1 every 4 hours as needed for drainage and throat clearing Continue on a GERD diet Continue Gabapentin 100 mg three times day  Follow-up with Dr. Melvyn Novas in 6 weeks for pulmonary function test Use sips of water to help with cough and throat clearing. Avoid all mint products Please contact office for sooner follow up if symptoms do not improve or worsen or seek emergency care

## 2014-12-21 NOTE — Addendum Note (Signed)
Addended by: Parke Poisson E on: 12/21/2014 03:58 PM   Modules accepted: Orders

## 2014-12-21 NOTE — Assessment & Plan Note (Signed)
UACS with throat clearing suspect triggered by AR/GERD  cxr clear  Allergy profile +cat danger, IgE ok.  CBC -eos ok  Plan  Follow med calendar closely and bring to each visit May begin Delsym 2 teaspoons twice daily as needed for cough and throat clearing  Tessalon 3 times a day as needed for cough and throat clearing May restart chlor tabs 4 mg 1 every 4 hours as needed for drainage and throat clearing Continue on a GERD diet Continue Gabapentin 100 mg three times day  Follow-up with Dr. Melvyn Novas in 6 weeks for pulmonary function test Use sips of water to help with cough and throat clearing. Avoid all mint products Please contact office for sooner follow up if symptoms do not improve or worsen or seek emergency care

## 2014-12-21 NOTE — Progress Notes (Signed)
Subjective:    Patient ID: Sheila Vincent, female    DOB: 08-Mar-1961,     MRN: [id number]  HPI  3 yowf never smoker dx chrons dz age 54 then asthma around 2005 eval by allergist "allergic to everything" shots helped but could not afford assoc with sinus drainage eval by ENT ? GERD no better on Rx so referred by Dr Jonelle Sidle to pulmonary clinic p some improvement on prednisone worse off it 12/06/2014    12/06/2014 1st [facility] office visit/ [facility]   Chief Complaint  Patient presents with  . Pulmonary Consult    Referred by Dr. Alean Rinne. Pt c/o PND and cough for the past 6 months. Her cough is occ prod with green, bloody sputum. She also c/o occ SOB wiuth coughing spells and states "I already have asthma".  Prednisone has helped with her symptoms in the past.    day >> noct with urge to clear throat assoc with bad HB with sob mostly when coughing and saba not really helping/ not taking aggressive gerd rx  >>pred taper , gabapentin Three times a day  , GERD/AR tx    12/21/2014 Follow-up and medication review: Cough /never smoker  Patient returns for a 2 week follow-up and medication review We reviewed all her medications organize them into a medication count with patient education She is aggravated with her throat clearing and lack of progress.  She does not feel she is any better.  Patient was seen for pulmonary consultation for cough last ov.  She was placed on a prednisone taper. Given gabapentin 100 mg 3 times daily. Placed on aggressive regimen for reflux and rhinitis prevention She says she is seen by allergist and was told to stop chlor tabs .  She has recurrent throat clearing , in office today cleared throat at least every 2 minutes.  Asked her to try sips of water to help with recurrent throat clearing but she declined becoming very agitated.  Says she is disabled , asked her from what " lots of stuff, all my medical problems" .  She denies any hemoptysis, orthopnea, PND, leg  swelling. Chest x-ray last visit showed no acute process Allergy profile showed IgE around 60, RAST profile was negative except for cat, dander She says that she has 4 cats, discussed keeping the pads out of her bedroom and using allergy pillow/cases  CBC showed a normal white blood cell count and normal eosinophils. She says that she has been seen by ENT and told that she had reflux. On chronic narcotics for chronic pain "all over " per pt.             Review of Systems  Constitutional:   No  weight loss, night sweats,  Fevers, chills,  +fatigue, or  lassitude.  HEENT:   No headaches,  Difficulty swallowing,  Tooth/dental problems, or  Sore throat,                No sneezing, itching, ear ache, + nasal congestion, post nasal drip,   CV:  No chest pain,  Orthopnea, PND, swelling in lower extremities, anasarca, dizziness, palpitations, syncope.   GI  No heartburn, indigestion, abdominal pain, nausea, vomiting, diarrhea, change in bowel habits, loss of appetite, bloody stools.   Resp:   No chest wall deformity  Skin: no rash or lesions.  GU: no dysuria, change in color of urine, no urgency or frequency.  No flank pain, no hematuria   MS:  No joint  pain or swelling.  No decreased range of motion.  No back pain.  Psych:  No change in mood or affect. No depression or anxiety.  No memory loss.         Objective:   Physical Exam amb wf nad   Vital signs reviewed  HEENT: nl dentition, turbinates, and orophanx. Nl external ear canals without cough reflex, +++throat clearing  Post phaynx clear w/out exudate or lesions  NECK :  without JVD/Nodes/TM/ nl carotid upstrokes bilaterally   LUNGS: no acc muscle use, clear to A and P bilaterally without cough on insp or exp maneuvers   CV:  RRR  no s3 or murmur or increase in P2, no edema   ABD:  soft and nontender with nl excursion in the supine position. No bruits or organomegaly, bowel sounds nl  MS:  warm without  deformities, calf tenderness, cyanosis or clubbing  SKIN: warm and dry without lesions    NEURO:  alert, approp, no deficits  CXR:  12/06/14  I personally reviewed images and agree with radiology impression as follows:  Lungs are clear. Heart size and pulmonary vascularity are normal. No adenopathy. There is sclerosis along the anterior right first rib, a stable finding. There is mild anterior wedging of the L1 vertebral body, stable.         Assessment & Plan:

## 2014-12-21 NOTE — Progress Notes (Signed)
Chart and office notes reviewed agree with present ov a/p - very unlikely we will find a single cause for this type of cough which may be at least partially habitual throat clearing.

## 2014-12-25 NOTE — Addendum Note (Signed)
Addended by: Parke Poisson E on: 12/25/2014 01:56 PM   Modules accepted: Orders, Medications

## 2015-01-05 ENCOUNTER — Telehealth: Payer: Self-pay | Admitting: Internal Medicine

## 2015-01-05 MED ORDER — OMEPRAZOLE 40 MG PO CPDR: 40.0000 mg | DELAYED_RELEASE_CAPSULE | Freq: Every day | ORAL | Status: DC

## 2015-01-05 MED ORDER — GABAPENTIN 300 MG PO CAPS: 300.0000 mg | ORAL_CAPSULE | Freq: Three times a day (TID) | ORAL | Status: DC

## 2015-01-05 MED ORDER — OXYCODONE-ACETAMINOPHEN 7.5-325 MG PO TABS: 1.0000 | ORAL_TABLET | Freq: Four times a day (QID) | ORAL | Status: DC | PRN

## 2015-01-05 NOTE — Telephone Encounter (Signed)
Pt returning call.Sheila Vincent ° °

## 2015-01-05 NOTE — Telephone Encounter (Signed)
Called and spoke to pt. Informed pt of the recs per MW. Rx sent to preferred pharmacy. Pt aware Percocet is up front for pick up. Pt verbalized understanding. Nothing further needed.

## 2015-01-05 NOTE — Telephone Encounter (Signed)
lmtcb for pt.  

## 2015-01-05 NOTE — Telephone Encounter (Signed)
The standardized cough guidelines published in Chest by Lissa Morales in 2006 are still the best available and consist of a multiple step process (up to 12!) , not a single office visit,  So I'm not promising to do better than the world's best  1) the chlortrimeton is his rec, not mine, and usually works great for pnds  2) sinus ct needs to be done next   3) the cough needs to be stopped, best bet is tessilon not working :  Take delsym two tsp every 12 hours and supplement if needed with  tramadol 50 mg up to 2 every 4 hours to suppress the urge to cough. Swallowing water or using ice chips/non mint and menthol containing candies (such as lifesavers or sugarless jolly ranchers) are also effective.  You should rest your voice and avoid activities that you know make you cough.  Once you have eliminated the cough for 3 straight days try reducing the tramadol first,  then the delsym as tolerated.     #40  One - 2 every 4 h prn  If not satisfied refer to Voice center at [facility]

## 2015-01-05 NOTE — Telephone Encounter (Signed)
If she's already tried the tramadol,  Best bet is take up to 2 percocet every 4 hours as needed, increase the neurontin to 300 tid, increase the prilosec to 40 mg Take 30- 60 min before your first and last meals of the day and if not happy with these recs let us refer her to [facility] or let her allergist/ ent docs here take over as this is not likely a lung issue at all.  We can call her in percocet #60 extra for purpose of short term high dose elimination of cyclical cough

## 2015-01-05 NOTE — Telephone Encounter (Signed)
Pt states that he is not getting any better following rec's from OVs with MW and TP  Pt states that she does not feel any of the regimens that she has been put on is helping her and it is wasting her money.  Pt states that she can feel PND in the back of her throat that she cannot get rid of(unable to get up). .  Pt states that she out of Tessalon Perles, does not want the refill because she does not feel it worked at all.  Pt constantly coughing and forcefully clearing throat while on phone.  Pt has appt 02/05/15 with MW with PFT prior.  Allergies  Allergen Reactions  . Bactrim Anxiety    Nervous,"cant sit still", paranoid   Please advise Dr Melvyn Novas. Thanks.

## 2015-01-05 NOTE — Telephone Encounter (Signed)
Patient says that her sinuses are draining down the back of her throat causing her to choke.  Now causing a lot of chest congestion as well.  Pt says she is not coughing, she is just having to clear her throat all the time because she feels like she is choking.  She says that she has tried Mucinex DM, Singular, Tramadol, Chlorpheniramine, Gabapentin, Prilosec, Benzonatate, Albuterol inhaler, percocet and Delsym.  Nothing is working.  She does not want to go to [facility] to see a voice specialist.  She says that she cannot afford anymore OTC medications. She is on SS disability.  She says that prescription medications only cost her $2.  She wants to know if there is a prescription medication that she can take that will help her?    To Dr. Melvyn Novas, please advise.

## 2015-01-08 ENCOUNTER — Ambulatory Visit: Payer: PRIVATE HEALTH INSURANCE | Admitting: Gastroenterology

## 2015-01-16 ENCOUNTER — Institutional Professional Consult (permissible substitution): Payer: PRIVATE HEALTH INSURANCE | Admitting: Pulmonary Disease

## 2015-02-05 ENCOUNTER — Ambulatory Visit: Payer: Medicare HMO | Admitting: Internal Medicine

## 2015-03-09 ENCOUNTER — Ambulatory Visit: Payer: Medicare HMO | Admitting: Internal Medicine

## 2015-03-26 ENCOUNTER — Encounter: Payer: Medicare HMO | Admitting: Podiatry

## 2015-04-03 NOTE — Progress Notes (Signed)
This encounter was created in error - please disregard.

## 2015-04-10 ENCOUNTER — Other Ambulatory Visit: Payer: Self-pay | Admitting: *Deleted

## 2015-04-10 DIAGNOSIS — R0600 Dyspnea, unspecified: Secondary | ICD-10-CM

## 2015-04-11 ENCOUNTER — Ambulatory Visit: Payer: Medicare HMO | Admitting: Internal Medicine

## 2015-07-14 DIAGNOSIS — J4530 Mild persistent asthma, uncomplicated: Secondary | ICD-10-CM

## 2015-07-14 DIAGNOSIS — J3089 Other allergic rhinitis: Secondary | ICD-10-CM | POA: Insufficient documentation

## 2015-07-14 DIAGNOSIS — J309 Allergic rhinitis, unspecified: Secondary | ICD-10-CM

## 2015-07-14 DIAGNOSIS — J4540 Moderate persistent asthma, uncomplicated: Secondary | ICD-10-CM | POA: Insufficient documentation

## 2015-10-30 ENCOUNTER — Ambulatory Visit: Payer: Medicare HMO | Admitting: Allergy and Immunology

## 2016-10-26 ENCOUNTER — Emergency Department (HOSPITAL_COMMUNITY)
Admission: EM | Admit: 2016-10-26 | Discharge: 2016-10-26 | Disposition: A | Discharge status: Home / Self Care | Payer: Medicare HMO | Attending: Emergency Medicine | Admitting: Emergency Medicine

## 2016-10-26 ENCOUNTER — Encounter (HOSPITAL_COMMUNITY): Payer: Self-pay | Admitting: Emergency Medicine

## 2016-10-26 DIAGNOSIS — R0982 Postnasal drip: Secondary | ICD-10-CM | POA: Insufficient documentation

## 2016-10-26 DIAGNOSIS — R490 Dysphonia: Secondary | ICD-10-CM | POA: Diagnosis not present

## 2016-10-26 DIAGNOSIS — J309 Allergic rhinitis, unspecified: Secondary | ICD-10-CM

## 2016-10-26 DIAGNOSIS — J069 Acute upper respiratory infection, unspecified: Secondary | ICD-10-CM | POA: Diagnosis present

## 2016-10-26 MED ORDER — AZELASTINE HCL 0.1 % NA SOLN
1.0000 | Freq: Two times a day (BID) | NASAL | Status: DC
  Administered 2016-10-26: 1 via NASAL
  Filled 2016-10-26: qty 30
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
## 2016-10-26 NOTE — ED Triage Notes (Signed)
Pt here for URI sx and cough with hoarse voice this am

## 2016-10-26 NOTE — Discharge Instructions (Signed)
You have been given a nasal spray.  Please uses and 2 you can refill your prescription.  Make an appointment with your primary care decision for follow-up.

## 2017-06-25 ENCOUNTER — Ambulatory Visit (INDEPENDENT_AMBULATORY_CARE_PROVIDER_SITE_OTHER): Payer: Medicare HMO | Admitting: Specialist

## 2018-03-30 ENCOUNTER — Ambulatory Visit: Payer: Medicare HMO | Admitting: Allergy and Immunology

## 2018-03-30 ENCOUNTER — Telehealth: Payer: Self-pay | Admitting: Allergy and Immunology

## 2018-03-30 ENCOUNTER — Encounter: Payer: Self-pay | Admitting: Allergy and Immunology

## 2018-03-30 VITALS — BP 155/98 | HR 88 | Temp 98.0°F | Resp 19 | Ht 61.5 in | Wt 147.8 lb

## 2018-03-30 DIAGNOSIS — J3089 Other allergic rhinitis: Secondary | ICD-10-CM

## 2018-03-30 DIAGNOSIS — I10 Essential (primary) hypertension: Secondary | ICD-10-CM

## 2018-03-30 DIAGNOSIS — J4540 Moderate persistent asthma, uncomplicated: Secondary | ICD-10-CM | POA: Diagnosis not present

## 2018-03-30 HISTORY — DX: Essential (primary) hypertension: I10

## 2018-03-30 MED ORDER — FLUTICASONE PROPIONATE HFA 110 MCG/ACT IN AERO: 2.0000 | INHALATION_SPRAY | Freq: Two times a day (BID) | RESPIRATORY_TRACT | 5 refills | Status: DC

## 2018-03-30 MED ORDER — AZELASTINE HCL 0.1 % NA SOLN: 2.0000 | NASAL | 5 refills | Status: DC | PRN

## 2018-03-30 MED ORDER — PROAIR HFA 108 (90 BASE) MCG/ACT IN AERS: 2.0000 | INHALATION_SPRAY | Freq: Four times a day (QID) | RESPIRATORY_TRACT | 1 refills | Status: DC | PRN
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
## 2018-03-30 NOTE — Patient Instructions (Addendum)
Hypertension  The patient has been made aware of the elevated blood pressure readings today.    We have contacted the primary care physician for records.    She has been encouraged to contact her primary care physician today to address this issue.    She has been encouraged to seek immediate medical attention for any concerning symptoms such as severe headaches, visual changes, or neurologic changes.    She has been instructed to discontinue all decongestants, including pseudoephedrine and phenylephrine.  Sheila Vincent has verbalized understanding and agreed to do so.  Moderate persistent asthma Currently with suboptimal control.  We will step up therapy at this time.  A prescription has been provided for Flovent (fluticasone) 110 g,  2 inhalations twice a day. To maximize pulmonary deposition, a spacer has been provided along with instructions for its proper administration with an HFA inhaler.  Continue montelukast 10 mg daily at bedtime.  A refill prescription has been provided for albuterol HFA, 1 to 2 inhalations every 6 hours if needed.  The patient has been asked to contact me if her symptoms persist or progress. Otherwise, she may return for follow up in 4 months.  Other allergic rhinitis  In anticipation of initiating immunotherapy, the patient is scheduled to return for aeroallergen retest to ensure accurate and comprehensive vials.  She will be off of antihistamines for 3 days prior to skin testing.  A prescription has been provided for azelastine nasal spray, 1-2 sprays per nostril 2 times daily as needed. Proper nasal spray technique has been discussed and demonstrated.   Continue fluticasone nasal spray, 2 sprays per nostril daily as needed.  Nasal saline spray (i.e., Simply Saline) or nasal saline lavage (i.e., NeilMed) is recommended as needed and prior to medicated nasal sprays.  Due to hypertension, all decongestants, including pseudoephedrine and phenylephrine, will  be avoided.   Return for allergy skin testing after she has been off of antihistamines for at least 3 days.Marland Kitchen

## 2018-03-30 NOTE — Telephone Encounter (Signed)
Per patient's formulary azelastine 0.1% is covered at Tier 3 coverage, Flovent is covered at Tier 3 coverage and Proair just needs to be changed to Ventolin HFA to be covered at Tier 3 coverage. I did call patient and was unable to leave a message. Will await call back.

## 2018-03-30 NOTE — Assessment & Plan Note (Signed)
Currently with suboptimal control.  We will step up therapy at this time.  A prescription has been provided for Flovent (fluticasone) 110 g, 2 inhalations twice a day. To maximize pulmonary deposition, a spacer has been provided along with instructions for its proper administration with an HFA inhaler.  Continue montelukast 10 mg daily at bedtime.  A refill prescription has been provided for albuterol HFA, 1 to 2 inhalations every 6 hours if needed.  The patient has been asked to contact me if her symptoms persist or progress. Otherwise, she may return for follow up in 4 months.

## 2018-03-30 NOTE — Assessment & Plan Note (Addendum)
   The patient has been made aware of the elevated blood pressure readings today.    We have contacted the primary care physician for records.    She has been encouraged to contact her primary care physician today to address this issue.    She has been encouraged to seek immediate medical attention for any concerning symptoms such as severe headaches, visual changes, or neurologic changes.    She has been instructed to discontinue all decongestants, including pseudoephedrine and phenylephrine.  Sheila Vincent has verbalized understanding and agreed to do so.

## 2018-03-30 NOTE — Assessment & Plan Note (Addendum)
   In anticipation of initiating immunotherapy, the patient is scheduled to return for aeroallergen retest to ensure accurate and comprehensive vials.  She will be off of antihistamines for 3 days prior to skin testing.  A prescription has been provided for azelastine nasal spray, 1-2 sprays per nostril 2 times daily as needed. Proper nasal spray technique has been discussed and demonstrated.   Continue fluticasone nasal spray, 2 sprays per nostril daily as needed.  Nasal saline spray (i.e., Simply Saline) or nasal saline lavage (i.e., NeilMed) is recommended as needed and prior to medicated nasal sprays.  Due to hypertension, all decongestants, including pseudoephedrine and phenylephrine, will be avoided.

## 2018-03-30 NOTE — Telephone Encounter (Signed)
Pt called and said that her ins will not cover proair and astelin ,flovent .[facility]. [phone].

## 2018-03-31 ENCOUNTER — Telehealth (INDEPENDENT_AMBULATORY_CARE_PROVIDER_SITE_OTHER): Payer: Self-pay | Admitting: Specialist

## 2018-03-31 NOTE — Telephone Encounter (Signed)
Placed on cancellation list 

## 2018-03-31 NOTE — Telephone Encounter (Signed)
Patient has an appointment on June 24th.  She is a new patient with Dr. Louanne Skye.  She would like to be placed on the cancellation for anything before that date.  CB#[phone].  Thank you.

## 2018-04-01 NOTE — Telephone Encounter (Signed)
I called the patient again and still received no answer and was not able to leave a message.

## 2018-04-02 ENCOUNTER — Other Ambulatory Visit: Payer: Self-pay

## 2018-04-02 MED ORDER — ALBUTEROL SULFATE HFA 108 (90 BASE) MCG/ACT IN AERS: 2.0000 | INHALATION_SPRAY | RESPIRATORY_TRACT | 0 refills | Status: DC | PRN

## 2018-04-02 NOTE — Telephone Encounter (Signed)
Patient does have a follow up appointment for allergy testing with Dr. Verlin Fester on 04/06/18 at 10:45 am.  Medication may need to be discussed at that time if unable to reach patient.

## 2018-04-02 NOTE — Telephone Encounter (Signed)
Called patient and left voicemail message to call office to discuss her medication and alternatives per formulary coverage.

## 2018-04-02 NOTE — Telephone Encounter (Signed)
Pt called back, was informed that we would change ProAir to Ventolin. Pt will call pharmacy to inquire about cost of meds. States that she may not be able to afford all the meds as prescribed. Encouraged her to call us back should we need to work through these issues. Has been reminded of the importance of keeping her appointment with Dr. Verlin Fester on June 4 @ 10:45. Rx sent to pharmacy.

## 2018-04-06 ENCOUNTER — Ambulatory Visit (INDEPENDENT_AMBULATORY_CARE_PROVIDER_SITE_OTHER): Payer: Medicare HMO | Admitting: Allergy and Immunology

## 2018-04-06 ENCOUNTER — Encounter: Payer: Self-pay | Admitting: Allergy and Immunology

## 2018-04-06 VITALS — BP 142/86 | HR 92 | Temp 97.6°F | Resp 16

## 2018-04-06 DIAGNOSIS — J4540 Moderate persistent asthma, uncomplicated: Secondary | ICD-10-CM

## 2018-04-06 DIAGNOSIS — K219 Gastro-esophageal reflux disease without esophagitis: Secondary | ICD-10-CM

## 2018-04-06 DIAGNOSIS — R05 Cough: Secondary | ICD-10-CM

## 2018-04-06 DIAGNOSIS — J3089 Other allergic rhinitis: Secondary | ICD-10-CM

## 2018-04-06 DIAGNOSIS — R053 Chronic cough: Secondary | ICD-10-CM | POA: Insufficient documentation

## 2018-04-06 MED ORDER — EPINEPHRINE 0.3 MG/0.3ML IJ SOAJ: 0.3000 mg | Freq: Once | INTRAMUSCULAR | 2 refills | Status: AC

## 2018-04-06 MED ORDER — CARBINOXAMINE MALEATE 4 MG PO TABS: ORAL_TABLET | ORAL | 5 refills | Status: DC

## 2018-04-06 NOTE — Patient Instructions (Addendum)
Perennial and seasonal allergic rhinitis  Aeroallergen avoidance measures have been discussed and provided in written form.  A prescription has been provided for carbinoxamine 4 mg every 8 hours if needed.  Continue azelastine nasal spray, 2 sprays per nostril 1-2 times daily if needed.  Nasal saline spray (i.e., Simply Saline) or nasal saline lavage (i.e., NeilMed) is recommended as needed and prior to medicated nasal sprays.  The risks and benefits of aeroallergen immunotherapy have been discussed. The patients mother is motivated to initiate immunotherapy to reduce symptoms and decrease medication requirement. Informed consent has been signed and allergen vaccine orders have been submitted. Medications will be decreased or discontinued as symptom relief from immunotherapy becomes evident.  Moderate persistent asthma  Continue Flovent 110 g, 2 inhalations via spacer device twice daily, montelukast 10 mg daily bedtime, and albuterol HFA, 1 to 2 inhalations every 6 hours if needed.  Subjective and objective measures of pulmonary function will be followed and the treatment plan will be adjusted accordingly.  GERD (gastroesophageal reflux disease)  Appropriate reflux lifestyle modifications have been discussed and provided in written form.  I have encouraged compliance with daily dosing of omeprazole as prescribed.  Cough, persistent The most common causes of chronic cough include the following: upper airway cough syndrome (UACS) which is caused by variety of rhinosinus conditions; asthma; gastroesophageal reflux disease (GERD); chronic bronchitis from cigarette smoking or other inhaled environmental irritants; non-asthmatic eosinophilic bronchitis; and bronchiectasis. In prospective studies, these conditions have accounted for up to 94% of the causes of chronic cough in immunocompetent adults. The history and physical examination suggest that her cough is multifactorial with contribution  from viscous postnasal drainage, bronchial hyperresponsiveness, and acid reflux. We will address these issues at this time.   Treatment plan as outlined above.     Return in about 4 months (around 08/06/2018), or if symptoms worsen or fail to improve.  Reducing Pollen Exposure  The American Academy of Allergy, Asthma and Immunology suggests the following steps to reduce your exposure to pollen during allergy seasons.    1. Do not hang sheets or clothing out to dry; pollen may collect on these items. 2. Do not mow lawns or spend time around freshly cut grass; mowing stirs up pollen. 3. Keep windows closed at night.  Keep car windows closed while driving. 4. Minimize morning activities outdoors, a time when pollen counts are usually at their highest. 5. Stay indoors as much as possible when pollen counts or humidity is high and on windy days when pollen tends to remain in the air longer. 6. Use air conditioning when possible.  Many air conditioners have filters that trap the pollen spores. 7. Use a HEPA room air filter to remove pollen form the indoor air you breathe.   Control of House Dust Mite Allergen  House dust mites play a major role in allergic asthma and rhinitis.  They occur in environments with high humidity wherever human skin, the food for dust mites is found. High levels have been detected in dust obtained from mattresses, pillows, carpets, upholstered furniture, bed covers, clothes and soft toys.  The principal allergen of the house dust mite is found in its feces.  A gram of dust may contain 1,000 mites and 250,000 fecal particles.  Mite antigen is easily measured in the air during house cleaning activities.    1. Encase mattresses, including the box spring, and pillow, in an air tight cover.  Seal the zipper end of the encased mattresses with  wide adhesive tape. 2. Wash the bedding in water of 130 degrees Farenheit weekly.  Avoid cotton comforters/quilts and flannel bedding:  the most ideal bed covering is the dacron comforter. 3. Remove all upholstered furniture from the bedroom. 4. Remove carpets, carpet padding, rugs, and non-washable window drapes from the bedroom.  Wash drapes weekly or use plastic window coverings. 5. Remove all non-washable stuffed toys from the bedroom.  Wash stuffed toys weekly. 6. Have the room cleaned frequently with a vacuum cleaner and a damp dust-mop.  The patient should not be in a room which is being cleaned and should wait 1 hour after cleaning before going into the room. 7. Close and seal all heating outlets in the bedroom.  Otherwise, the room will become filled with dust-laden air.  An electric heater can be used to heat the room. Reduce indoor humidity to less than 50%.  Do not use a humidifier.  Control of Dog or Cat Allergen  Avoidance is the best way to manage a dog or cat allergy. If you have a dog or cat and are allergic to dog or cats, consider removing the dog or cat from the home. If you have a dog or cat but don't want to find it a new home, or if your family wants a pet even though someone in the household is allergic, here are some strategies that may help keep symptoms at bay:  1. Keep the pet out of your bedroom and restrict it to only a few rooms. Be advised that keeping the dog or cat in only one room will not limit the allergens to that room. 2. Don't pet, hug or kiss the dog or cat; if you do, wash your hands with soap and water. 3. High-efficiency particulate air (HEPA) cleaners run continuously in a bedroom or living room can reduce allergen levels over time. 4. Place electrostatic material sheet in the air inlet vent in the bedroom. 5. Regular use of a high-efficiency vacuum cleaner or a central vacuum can reduce allergen levels. 6. Giving your dog or cat a bath at least once a week can reduce airborne allergen.  Control of Mold Allergen  Mold and fungi can grow on a variety of surfaces provided certain  temperature and moisture conditions exist.  Outdoor molds grow on plants, decaying vegetation and soil.  The major outdoor mold, Alternaria and Cladosporium, are found in very high numbers during hot and dry conditions.  Generally, a late Summer - Fall peak is seen for common outdoor fungal spores.  Rain will temporarily lower outdoor mold spore count, but counts rise rapidly when the rainy period ends.  The most important indoor molds are Aspergillus and Penicillium.  Dark, humid and poorly ventilated basements are ideal sites for mold growth.  The next most common sites of mold growth are the bathroom and the kitchen.  Outdoor [employer] 1. Use air conditioning and keep windows closed 2. Avoid exposure to decaying vegetation. 3. Avoid leaf raking. 4. Avoid grain handling. 5. Consider wearing a face mask if working in moldy areas.  Indoor Mold Control 1. Maintain humidity below 50%. 2. Clean washable surfaces with 5% bleach solution. 3. Remove sources e.g. Contaminated carpets.  Lifestyle Changes for Controlling GERD  When you have GERD, stomach acid feels as if it's backing up toward your mouth. Whether or not you take medication to control your GERD, your symptoms can often be improved with lifestyle changes.   Raise Your Head  Reflux is more  likely to strike when you're lying down flat, because stomach fluid can  flow backward more easily. Raising the head of your bed 4-6 inches can help. To do this:  Slide blocks or books under the legs at the head of your bed. Or, place a wedge under  the mattress. Many foam stores can make a suitable wedge for you. The wedge  should run from your waist to the top of your head.  Don't just prop your head on several pillows. This increases pressure on your  stomach. It can make GERD worse.  Watch Your Eating Habits Certain foods may increase the acid in your stomach or relax the lower esophageal sphincter, making GERD more likely. It's  best to avoid the following:  Coffee, tea, and carbonated drinks (with and without caffeine)  Fatty, fried, or spicy food  Mint, chocolate, onions, and tomatoes  Any other foods that seem to irritate your stomach or cause you pain  Relieve the Pressure  Eat smaller meals, even if you have to eat more often.  Don't lie down right after you eat. Wait a few hours for your stomach to empty.  Avoid tight belts and tight-fitting clothes.  Lose excess weight.  Tobacco and Alcohol  Avoid smoking tobacco and drinking alcohol. They can make GERD symptoms worse.
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
## 2018-04-06 NOTE — Assessment & Plan Note (Signed)
   Continue Flovent 110 g, 2 inhalations via spacer device twice daily, montelukast 10 mg daily bedtime, and albuterol HFA, 1 to 2 inhalations every 6 hours if needed.  Subjective and objective measures of pulmonary function will be followed and the treatment plan will be adjusted accordingly.

## 2018-04-06 NOTE — Assessment & Plan Note (Signed)
   Appropriate reflux lifestyle modifications have been discussed and provided in written form.  I have encouraged compliance with daily dosing of omeprazole as prescribed.

## 2018-04-06 NOTE — Assessment & Plan Note (Signed)
The most common causes of chronic cough include the following: upper airway cough syndrome (UACS) which is caused by variety of rhinosinus conditions; asthma; gastroesophageal reflux disease (GERD); chronic bronchitis from cigarette smoking or other inhaled environmental irritants; non-asthmatic eosinophilic bronchitis; and bronchiectasis. In prospective studies, these conditions have accounted for up to 94% of the causes of chronic cough in immunocompetent adults. The history and physical examination suggest that her cough is multifactorial with contribution from viscous postnasal drainage, bronchial hyperresponsiveness, and acid reflux. We will address these issues at this time.   Treatment plan as outlined above.

## 2018-04-06 NOTE — Assessment & Plan Note (Signed)
   Aeroallergen avoidance measures have been discussed and provided in written form.  A prescription has been provided for carbinoxamine 4 mg every 8 hours if needed.  Continue azelastine nasal spray, 2 sprays per nostril 1-2 times daily if needed.  Nasal saline spray (i.e., Simply Saline) or nasal saline lavage (i.e., NeilMed) is recommended as needed and prior to medicated nasal sprays.  The risks and benefits of aeroallergen immunotherapy have been discussed. The patients mother is motivated to initiate immunotherapy to reduce symptoms and decrease medication requirement. Informed consent has been signed and allergen vaccine orders have been submitted. Medications will be decreased or discontinued as symptom relief from immunotherapy becomes evident.

## 2018-04-07 DIAGNOSIS — J3089 Other allergic rhinitis: Secondary | ICD-10-CM | POA: Diagnosis not present

## 2018-04-07 NOTE — Progress Notes (Signed)
VIALS EXP 04-08-19

## 2018-04-08 DIAGNOSIS — J301 Allergic rhinitis due to pollen: Secondary | ICD-10-CM | POA: Diagnosis not present

## 2018-04-09 ENCOUNTER — Other Ambulatory Visit: Payer: Self-pay

## 2018-04-09 MED ORDER — CARBINOXAMINE MALEATE 4 MG PO TABS: ORAL_TABLET | ORAL | 5 refills | Status: DC

## 2018-04-15 ENCOUNTER — Telehealth: Payer: Self-pay

## 2018-04-15 NOTE — Telephone Encounter (Signed)
Prior authorization requested for carbinoxamine 4 mg tablets. This was denied by insurance.Patient does have cetirizine and loratadine in her medication history.  Please advise on further instructions for patient.

## 2018-04-15 NOTE — Telephone Encounter (Signed)
Please call her and ask if she has ever taken zyrtec and Claritin. It would be unlikely that she has never taken these. Then document it and proceed with PA. Thanks.

## 2018-04-16 NOTE — Telephone Encounter (Signed)
Patient has tried and failed cetirizine and loratadine. I included that information on the prior authorization request and it was denied. Please advise?

## 2018-04-19 MED ORDER — LEVOCETIRIZINE DIHYDROCHLORIDE 5 MG PO TABS: 5.0000 mg | ORAL_TABLET | Freq: Every evening | ORAL | 5 refills | Status: DC

## 2018-04-19 NOTE — Addendum Note (Signed)
Addended by: Lucrezia Starch I on: 04/19/2018 09:25 AM   Modules accepted: Orders

## 2018-04-19 NOTE — Telephone Encounter (Signed)
Prescription has been changed and sent in as requested.

## 2018-04-19 NOTE — Telephone Encounter (Signed)
Go with levocetirizne 5 mg daily as needed. Thanks.

## 2018-04-26 ENCOUNTER — Ambulatory Visit (INDEPENDENT_AMBULATORY_CARE_PROVIDER_SITE_OTHER): Payer: Medicare HMO | Admitting: Specialist

## 2018-04-27 ENCOUNTER — Ambulatory Visit (INDEPENDENT_AMBULATORY_CARE_PROVIDER_SITE_OTHER): Payer: Medicare HMO | Admitting: *Deleted

## 2018-04-27 DIAGNOSIS — J309 Allergic rhinitis, unspecified: Secondary | ICD-10-CM | POA: Diagnosis not present

## 2018-04-27 NOTE — Progress Notes (Signed)
Immunotherapy   Patient Details  Name: Katlyn Muldrew MRN: [id number] Date of Birth: 04-26-1961  04/27/2018  Veverly Fells started injections for  Grass-Weed-Dmite-Cat-Dog & Mold. Following schedule: A  Frequency:2 times per week Epi-Pen:Epi-Pen Available  Consent signed and patient instructions given. No problems after 30 minutes in the office.   Horris Latino 04/27/2018, 10:44 AM

## 2018-04-30 ENCOUNTER — Ambulatory Visit (INDEPENDENT_AMBULATORY_CARE_PROVIDER_SITE_OTHER): Payer: Medicare HMO

## 2018-04-30 DIAGNOSIS — J309 Allergic rhinitis, unspecified: Secondary | ICD-10-CM | POA: Diagnosis not present

## 2018-05-03 ENCOUNTER — Ambulatory Visit (INDEPENDENT_AMBULATORY_CARE_PROVIDER_SITE_OTHER): Payer: Medicare HMO | Admitting: *Deleted

## 2018-05-03 DIAGNOSIS — J309 Allergic rhinitis, unspecified: Secondary | ICD-10-CM | POA: Diagnosis not present

## 2018-05-05 ENCOUNTER — Ambulatory Visit (INDEPENDENT_AMBULATORY_CARE_PROVIDER_SITE_OTHER): Payer: Medicare HMO

## 2018-05-05 DIAGNOSIS — J309 Allergic rhinitis, unspecified: Secondary | ICD-10-CM | POA: Diagnosis not present

## 2018-05-11 ENCOUNTER — Ambulatory Visit (INDEPENDENT_AMBULATORY_CARE_PROVIDER_SITE_OTHER): Payer: Medicare HMO | Admitting: *Deleted

## 2018-05-11 DIAGNOSIS — J309 Allergic rhinitis, unspecified: Secondary | ICD-10-CM | POA: Diagnosis not present

## 2018-05-14 ENCOUNTER — Ambulatory Visit (INDEPENDENT_AMBULATORY_CARE_PROVIDER_SITE_OTHER): Payer: Medicare HMO

## 2018-05-14 DIAGNOSIS — J309 Allergic rhinitis, unspecified: Secondary | ICD-10-CM

## 2018-05-18 ENCOUNTER — Ambulatory Visit (INDEPENDENT_AMBULATORY_CARE_PROVIDER_SITE_OTHER): Payer: Medicare HMO | Admitting: *Deleted

## 2018-05-18 DIAGNOSIS — J309 Allergic rhinitis, unspecified: Secondary | ICD-10-CM

## 2018-05-21 ENCOUNTER — Ambulatory Visit (INDEPENDENT_AMBULATORY_CARE_PROVIDER_SITE_OTHER): Payer: Medicare HMO

## 2018-05-21 DIAGNOSIS — J309 Allergic rhinitis, unspecified: Secondary | ICD-10-CM | POA: Diagnosis not present

## 2018-05-25 ENCOUNTER — Ambulatory Visit (INDEPENDENT_AMBULATORY_CARE_PROVIDER_SITE_OTHER): Payer: Medicare HMO | Admitting: *Deleted

## 2018-05-25 DIAGNOSIS — J309 Allergic rhinitis, unspecified: Secondary | ICD-10-CM | POA: Diagnosis not present

## 2018-05-31 ENCOUNTER — Ambulatory Visit (INDEPENDENT_AMBULATORY_CARE_PROVIDER_SITE_OTHER): Payer: Medicare HMO

## 2018-05-31 DIAGNOSIS — J309 Allergic rhinitis, unspecified: Secondary | ICD-10-CM | POA: Diagnosis not present

## 2018-06-07 ENCOUNTER — Ambulatory Visit: Payer: Medicare HMO | Admitting: Allergy and Immunology

## 2018-06-08 ENCOUNTER — Ambulatory Visit (INDEPENDENT_AMBULATORY_CARE_PROVIDER_SITE_OTHER): Payer: Medicare HMO | Admitting: *Deleted

## 2018-06-08 DIAGNOSIS — J309 Allergic rhinitis, unspecified: Secondary | ICD-10-CM | POA: Diagnosis not present

## 2018-06-11 ENCOUNTER — Ambulatory Visit (INDEPENDENT_AMBULATORY_CARE_PROVIDER_SITE_OTHER): Payer: Medicare HMO

## 2018-06-11 DIAGNOSIS — J309 Allergic rhinitis, unspecified: Secondary | ICD-10-CM

## 2018-06-14 ENCOUNTER — Ambulatory Visit (INDEPENDENT_AMBULATORY_CARE_PROVIDER_SITE_OTHER): Payer: Medicare HMO

## 2018-06-14 DIAGNOSIS — J309 Allergic rhinitis, unspecified: Secondary | ICD-10-CM | POA: Diagnosis not present

## 2018-06-17 ENCOUNTER — Ambulatory Visit (INDEPENDENT_AMBULATORY_CARE_PROVIDER_SITE_OTHER): Payer: Medicare HMO | Admitting: *Deleted

## 2018-06-17 DIAGNOSIS — J309 Allergic rhinitis, unspecified: Secondary | ICD-10-CM | POA: Diagnosis not present

## 2018-06-23 ENCOUNTER — Ambulatory Visit (INDEPENDENT_AMBULATORY_CARE_PROVIDER_SITE_OTHER): Payer: Medicare HMO | Admitting: *Deleted

## 2018-06-23 DIAGNOSIS — J309 Allergic rhinitis, unspecified: Secondary | ICD-10-CM

## 2018-06-25 ENCOUNTER — Ambulatory Visit (INDEPENDENT_AMBULATORY_CARE_PROVIDER_SITE_OTHER): Payer: Medicare HMO | Admitting: *Deleted

## 2018-06-25 DIAGNOSIS — J309 Allergic rhinitis, unspecified: Secondary | ICD-10-CM | POA: Diagnosis not present

## 2018-06-28 ENCOUNTER — Ambulatory Visit: Payer: Self-pay | Admitting: *Deleted

## 2018-06-28 ENCOUNTER — Encounter: Payer: Self-pay | Admitting: Allergy & Immunology

## 2018-06-28 ENCOUNTER — Ambulatory Visit (INDEPENDENT_AMBULATORY_CARE_PROVIDER_SITE_OTHER): Payer: Medicare HMO | Admitting: Allergy & Immunology

## 2018-06-28 VITALS — BP 124/70 | HR 75 | Temp 97.6°F | Resp 19

## 2018-06-28 DIAGNOSIS — K219 Gastro-esophageal reflux disease without esophagitis: Secondary | ICD-10-CM

## 2018-06-28 DIAGNOSIS — J4540 Moderate persistent asthma, uncomplicated: Secondary | ICD-10-CM | POA: Diagnosis not present

## 2018-06-28 DIAGNOSIS — B86 Scabies: Secondary | ICD-10-CM

## 2018-06-28 DIAGNOSIS — J302 Other seasonal allergic rhinitis: Secondary | ICD-10-CM

## 2018-06-28 DIAGNOSIS — J3089 Other allergic rhinitis: Secondary | ICD-10-CM

## 2018-06-28 MED ORDER — PERMETHRIN 5 % EX CREA: TOPICAL_CREAM | CUTANEOUS | 0 refills | Status: DC

## 2018-06-28 MED ORDER — TRIAMCINOLONE ACETONIDE 0.1 % EX OINT: 1.0000 "application " | TOPICAL_OINTMENT | Freq: Two times a day (BID) | CUTANEOUS | 0 refills | Status: DC | PRN

## 2018-06-28 NOTE — Progress Notes (Signed)
FOLLOW UP  Date of Service/Encounter:  06/28/18   Assessment:   Moderate persistent asthma, unspecified whether complicated  Seasonal and perennial allergic rhinitis  Gastroesophageal reflux disease  Likely scabies   [name] presents today for an acute care visit due to what she calls hives.  However, evaluation of all the lesions on her body seems more consistent with some type of insect bite.  She reports that they are draining, which is certainly not consistent with hives.  All of the lesions are circular and none are draining today.  It seems that 1 or 2 might have become vesicular and popped, but there are none that are clearly vesicular today.  I think it is more likely that these are some kind of insect bite.  She denies any anti-exposures.  Although they are not in the characteristic locations of scabies, namely in the interdigital regions, I think a treatment with permethrin would be appropriate to see how she responds.  They are also adding on triamcinolone to see if this would help with any pruritus.  She does have indoor cats, so fleabites are a consideration.  She reports today that her cats are treated for fleas, but I encouraged her to go home and look for evidence of fleas anyway.  Bedbugs are another consideration.  Plan/Recommendations:   1. Insect bites - ? scabies or bed bugs - Start permethrin once weekly for two doses (apply at night and leave on until the next morning). - Start triamcinolone ointment twice daily as needed. - Check for insects in your home. - Increase Zyrtec (cetirizine) to one tablet three times daily for 2-4 weeks.   2. Return in about 2 weeks (around 07/12/2018).  Subjective:   Sheila Vincent is a 57 y.o. female presenting today for follow up of  Chief Complaint  Patient presents with  . Urticaria    [name] has a history of the following: Patient Active Problem List   Diagnosis Date Noted  . GERD (gastroesophageal reflux  disease) 04/06/2018  . Cough, persistent 04/06/2018  . Hypertension 03/30/2018  . Perennial and seasonal allergic rhinitis 07/14/2015  . Moderate persistent asthma 07/14/2015  . Upper airway cough syndrome 12/06/2014  . Right foot pain 07/06/2014  . Low back pain 07/06/2014    History obtained from: chart review and patient.  Gilberto Better Primary Care Provider is Elwyn Reach, MD.     Sheila Vincent is a 57 y.o. female presenting for a follow up visit.  She was last seen in June 2019 by Dr. Verlin Fester.  At that time, her allergic rhinitis was not well controlled so the decision to start immunotherapy was decided.  She was continued on as a lasting 2 sprays per nostril 1-2 times daily as well as carbinoxamine 4 mg every 8 hours as needed.  Her asthma was controlled with Flovent 110 mcg 2 puffs twice daily as well as Singulair 10 mg daily.  Reflux was controlled with lifestyle modifications as well as omeprazole.  Since the last visit, she has started developing what she says are hives. She reports that that "drain water". These have been on going for a while but she thinks that they have been occurring for approximately two months. She has tried putting alcohol and peroxide on them. She has a couple of them on her legs as well as her arms and abdomen. She does not have a topical steroid to put on them.  Of note, she does have cats in her home,  but she denies that they have any fleas.  She does live in a rental property.  [name] is on allergen immunotherapy. She receives two injections. Immunotherapy script #1 contains weeds, grasses, molds, dust mites, cat and dog. She currently receives 0.1mL of the GOLD vial (1/10,000). Immunotherapy script #2 contains molds. She currently receives 0.38mL of the GOLD vial (1/10,000). She started shots June of 2019 and not yet reached maintenance.   Otherwise, there have been no changes to her past medical history, surgical history, family history, or social  history.    Review of Systems: a 14-point review of systems is pertinent for what is mentioned in HPI.  Otherwise, all other systems were negative. Constitutional: negative other than that listed in the HPI Eyes: negative other than that listed in the HPI Ears, nose, mouth, throat, and face: negative other than that listed in the HPI Respiratory: negative other than that listed in the HPI Cardiovascular: negative other than that listed in the HPI Gastrointestinal: negative other than that listed in the HPI Genitourinary: negative other than that listed in the HPI Integument: negative other than that listed in the HPI Hematologic: negative other than that listed in the HPI Musculoskeletal: negative other than that listed in the HPI Neurological: negative other than that listed in the HPI Allergy/Immunologic: negative other than that listed in the HPI    Objective:   Blood pressure 124/70, pulse 75, temperature 97.6 F (36.4 C), resp. rate 19, SpO2 98 %. There is no height or weight on file to calculate BMI.   Physical Exam:  General: Alert, interactive, in no acute distress. Pleasant female.  Eyes: No conjunctival injection bilaterally, no discharge on the right, no discharge on the left and no Horner-Trantas dots present. PERRL bilaterally. EOMI without pain. No photophobia.  Ears: Right TM pearly gray with normal light reflex, Left TM pearly gray with normal light reflex, Right TM intact without perforation and Left TM intact without perforation.  Nose/Throat: External nose within normal limits and septum midline. Turbinates edematous and pale with clear discharge. Posterior oropharynx erythematous without cobblestoning in the posterior oropharynx. Tonsils 2+ without exudates.  Tongue without thrush. Lungs: Clear to auscultation without wheezing, rhonchi or rales. No increased work of breathing. CV: Normal S1/S2. No murmurs. Capillary refill <2 seconds.  Skin: Scattered papular  circular lesions over her abdomen and thighs as well as her buttocks. Neuro:   Grossly intact. No focal deficits appreciated. Responsive to questions.  Diagnostic studies: none      Salvatore Marvel, MD  [facility]

## 2018-06-28 NOTE — Patient Instructions (Addendum)
1. Insect bites - ? scabies or bed bugs - Start permethrin once weekly for two doses (apply at night and leave on until the next morning). - Start triamcinolone ointment twice daily as needed. - Check for insects in your home. - Increase Zyrtec (cetirizine) to one tablet three times daily for 2-4 weeks.   2. Return in about 2 weeks (around 07/12/2018).   Please inform us of any Emergency Department visits, hospitalizations, or changes in symptoms. Call us before going to the ED for breathing or allergy symptoms since we might be able to fit you in for a sick visit. Feel free to contact us anytime with any questions, problems, or concerns.  It was a pleasure to meet you today!  Websites that have reliable patient information: 1. American Academy of Asthma, Allergy, and Immunology: www.aaaai.org 2. Food Allergy Research and Education (FARE): foodallergy.org 3. Mothers of Asthmatics: http://www.asthmacommunitynetwork.org 4. American College of Allergy, Asthma, and Immunology: MonthlyElectricBill.co.uk   Make sure you are registered to vote! If you have moved or changed any of your contact information, you will need to get this updated before voting!

## 2018-06-30 ENCOUNTER — Ambulatory Visit (INDEPENDENT_AMBULATORY_CARE_PROVIDER_SITE_OTHER): Payer: Medicare HMO

## 2018-06-30 DIAGNOSIS — J309 Allergic rhinitis, unspecified: Secondary | ICD-10-CM

## 2018-07-02 ENCOUNTER — Ambulatory Visit (INDEPENDENT_AMBULATORY_CARE_PROVIDER_SITE_OTHER): Payer: Medicare HMO

## 2018-07-02 DIAGNOSIS — J309 Allergic rhinitis, unspecified: Secondary | ICD-10-CM

## 2018-07-07 ENCOUNTER — Ambulatory Visit (INDEPENDENT_AMBULATORY_CARE_PROVIDER_SITE_OTHER): Payer: Medicare HMO

## 2018-07-07 DIAGNOSIS — J309 Allergic rhinitis, unspecified: Secondary | ICD-10-CM | POA: Diagnosis not present

## 2018-07-09 ENCOUNTER — Ambulatory Visit (INDEPENDENT_AMBULATORY_CARE_PROVIDER_SITE_OTHER): Payer: Medicare HMO

## 2018-07-09 DIAGNOSIS — J309 Allergic rhinitis, unspecified: Secondary | ICD-10-CM

## 2018-07-12 ENCOUNTER — Ambulatory Visit: Payer: Medicare HMO | Admitting: Allergy and Immunology

## 2018-07-15 ENCOUNTER — Ambulatory Visit (INDEPENDENT_AMBULATORY_CARE_PROVIDER_SITE_OTHER): Payer: Medicare HMO | Admitting: *Deleted

## 2018-07-15 DIAGNOSIS — J309 Allergic rhinitis, unspecified: Secondary | ICD-10-CM | POA: Diagnosis not present

## 2018-07-21 ENCOUNTER — Ambulatory Visit (INDEPENDENT_AMBULATORY_CARE_PROVIDER_SITE_OTHER): Payer: Medicare HMO | Admitting: *Deleted

## 2018-07-21 DIAGNOSIS — J309 Allergic rhinitis, unspecified: Secondary | ICD-10-CM | POA: Diagnosis not present

## 2018-07-26 ENCOUNTER — Ambulatory Visit (INDEPENDENT_AMBULATORY_CARE_PROVIDER_SITE_OTHER): Payer: Medicare HMO

## 2018-07-26 DIAGNOSIS — J309 Allergic rhinitis, unspecified: Secondary | ICD-10-CM | POA: Diagnosis not present

## 2018-07-28 ENCOUNTER — Ambulatory Visit (INDEPENDENT_AMBULATORY_CARE_PROVIDER_SITE_OTHER): Payer: Medicare HMO

## 2018-07-28 DIAGNOSIS — J309 Allergic rhinitis, unspecified: Secondary | ICD-10-CM | POA: Diagnosis not present

## 2018-08-04 ENCOUNTER — Ambulatory Visit (INDEPENDENT_AMBULATORY_CARE_PROVIDER_SITE_OTHER): Payer: Medicare HMO | Admitting: *Deleted

## 2018-08-04 DIAGNOSIS — J309 Allergic rhinitis, unspecified: Secondary | ICD-10-CM

## 2018-08-06 ENCOUNTER — Ambulatory Visit (INDEPENDENT_AMBULATORY_CARE_PROVIDER_SITE_OTHER): Payer: Medicare HMO

## 2018-08-06 DIAGNOSIS — J309 Allergic rhinitis, unspecified: Secondary | ICD-10-CM | POA: Diagnosis not present

## 2018-08-11 ENCOUNTER — Ambulatory Visit (INDEPENDENT_AMBULATORY_CARE_PROVIDER_SITE_OTHER): Payer: Medicare HMO

## 2018-08-11 DIAGNOSIS — J309 Allergic rhinitis, unspecified: Secondary | ICD-10-CM | POA: Diagnosis not present

## 2018-08-13 ENCOUNTER — Ambulatory Visit (INDEPENDENT_AMBULATORY_CARE_PROVIDER_SITE_OTHER): Payer: Medicare HMO

## 2018-08-13 DIAGNOSIS — J309 Allergic rhinitis, unspecified: Secondary | ICD-10-CM

## 2018-08-19 ENCOUNTER — Ambulatory Visit (INDEPENDENT_AMBULATORY_CARE_PROVIDER_SITE_OTHER): Payer: Medicare HMO | Admitting: *Deleted

## 2018-08-19 DIAGNOSIS — J309 Allergic rhinitis, unspecified: Secondary | ICD-10-CM | POA: Diagnosis not present

## 2018-08-23 ENCOUNTER — Ambulatory Visit (INDEPENDENT_AMBULATORY_CARE_PROVIDER_SITE_OTHER): Payer: Medicare HMO

## 2018-08-23 DIAGNOSIS — J309 Allergic rhinitis, unspecified: Secondary | ICD-10-CM

## 2018-08-25 ENCOUNTER — Ambulatory Visit (INDEPENDENT_AMBULATORY_CARE_PROVIDER_SITE_OTHER): Payer: Medicare HMO

## 2018-08-25 DIAGNOSIS — J309 Allergic rhinitis, unspecified: Secondary | ICD-10-CM

## 2018-09-06 ENCOUNTER — Encounter: Payer: Self-pay | Admitting: Allergy and Immunology

## 2018-09-06 ENCOUNTER — Ambulatory Visit (INDEPENDENT_AMBULATORY_CARE_PROVIDER_SITE_OTHER): Payer: Medicare HMO | Admitting: Allergy and Immunology

## 2018-09-06 VITALS — BP 142/90 | HR 80 | Resp 18 | Ht 62.0 in

## 2018-09-06 DIAGNOSIS — J3089 Other allergic rhinitis: Secondary | ICD-10-CM

## 2018-09-06 DIAGNOSIS — R053 Chronic cough: Secondary | ICD-10-CM

## 2018-09-06 DIAGNOSIS — J4540 Moderate persistent asthma, uncomplicated: Secondary | ICD-10-CM

## 2018-09-06 DIAGNOSIS — J309 Allergic rhinitis, unspecified: Secondary | ICD-10-CM

## 2018-09-06 DIAGNOSIS — R05 Cough: Secondary | ICD-10-CM | POA: Diagnosis not present

## 2018-09-06 DIAGNOSIS — K219 Gastro-esophageal reflux disease without esophagitis: Secondary | ICD-10-CM

## 2018-09-06 MED ORDER — FAMOTIDINE 20 MG PO TABS: 20.0000 mg | ORAL_TABLET | Freq: Two times a day (BID) | ORAL | 5 refills | Status: DC

## 2018-09-06 MED ORDER — FLUTICASONE PROPIONATE HFA 110 MCG/ACT IN AERO: 2.0000 | INHALATION_SPRAY | Freq: Two times a day (BID) | RESPIRATORY_TRACT | 5 refills | Status: DC

## 2018-09-06 MED ORDER — OMEPRAZOLE 40 MG PO CPDR: 40.0000 mg | DELAYED_RELEASE_CAPSULE | Freq: Every day | ORAL | 5 refills | Status: DC

## 2018-09-06 MED ORDER — MONTELUKAST SODIUM 10 MG PO TABS: 10.0000 mg | ORAL_TABLET | Freq: Every day | ORAL | 5 refills | Status: DC

## 2018-09-06 MED ORDER — AZELASTINE HCL 0.1 % NA SOLN: 2.0000 | Freq: Two times a day (BID) | NASAL | 5 refills | Status: DC

## 2018-09-06 NOTE — Patient Instructions (Addendum)
Moderate persistent asthma Currently well controlled.  Continue Flovent 110 g, 2 inhalations via spacer device twice daily, montelukast 10 mg daily bedtime, and albuterol HFA, 1 to 2 inhalations every 6 hours if needed.  Subjective and objective measures of pulmonary function will be followed and the treatment plan will be adjusted accordingly.  Perennial and seasonal allergic rhinitis  Continue appropriate allergen avoidance measures, immunotherapy injections as prescribed, and fluticasone nasal spray, 2 sprays per nostril daily.  A prescription has been provided for azelastine nasal spray, 1-2 sprays per nostril 2 times daily as needed.   Nasal saline lavage (NeilMed) has been recommended as needed and prior to medicated nasal sprays along with instructions for proper administration.  For thick post nasal drainage, add guaifenesin [phone] mg (Mucinex)  twice daily as needed with adequate hydration as discussed.  GERD (gastroesophageal reflux disease)  Continue appropriate reflux lifestyle modifications and omeprazole as prescribed.  A prescription has been provided for famotidine (Pepcid) 20 mg twice daily.  Cough, persistent Cough and throat clearing.  Multifactorial.  Treatment plan as outlined above.   Return in about 5 months (around 02/05/2019), or if symptoms worsen or fail to improve.

## 2018-09-06 NOTE — Assessment & Plan Note (Signed)
   Continue appropriate reflux lifestyle modifications and omeprazole as prescribed.  A prescription has been provided for famotidine (Pepcid) 20 mg twice daily. 

## 2018-09-06 NOTE — Assessment & Plan Note (Signed)
Cough and throat clearing.  Multifactorial.  Treatment plan as outlined above.

## 2018-09-06 NOTE — Assessment & Plan Note (Signed)
Currently well controlled.  Continue Flovent 110 g, 2 inhalations via spacer device twice daily, montelukast 10 mg daily bedtime, and albuterol HFA, 1 to 2 inhalations every 6 hours if needed.  Subjective and objective measures of pulmonary function will be followed and the treatment plan will be adjusted accordingly.
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
## 2018-09-06 NOTE — Assessment & Plan Note (Signed)
   Continue appropriate allergen avoidance measures, immunotherapy injections as prescribed, and fluticasone nasal spray, 2 sprays per nostril daily.  A prescription has been provided for azelastine nasal spray, 1-2 sprays per nostril 2 times daily as needed.   Nasal saline lavage (NeilMed) has been recommended as needed and prior to medicated nasal sprays along with instructions for proper administration.  For thick post nasal drainage, add guaifenesin [phone] mg (Mucinex)  twice daily as needed with adequate hydration as discussed.

## 2018-09-07 ENCOUNTER — Ambulatory Visit: Payer: Medicare HMO | Admitting: Allergy and Immunology

## 2018-09-15 ENCOUNTER — Ambulatory Visit (INDEPENDENT_AMBULATORY_CARE_PROVIDER_SITE_OTHER): Payer: Medicare HMO

## 2018-09-15 DIAGNOSIS — J309 Allergic rhinitis, unspecified: Secondary | ICD-10-CM

## 2018-09-24 ENCOUNTER — Ambulatory Visit (INDEPENDENT_AMBULATORY_CARE_PROVIDER_SITE_OTHER): Payer: Medicare HMO | Admitting: *Deleted

## 2018-09-24 DIAGNOSIS — J309 Allergic rhinitis, unspecified: Secondary | ICD-10-CM | POA: Diagnosis not present

## 2018-10-04 ENCOUNTER — Ambulatory Visit (INDEPENDENT_AMBULATORY_CARE_PROVIDER_SITE_OTHER): Payer: Medicare HMO | Admitting: *Deleted

## 2018-10-04 DIAGNOSIS — J309 Allergic rhinitis, unspecified: Secondary | ICD-10-CM | POA: Diagnosis not present

## 2018-10-11 DIAGNOSIS — M1812 Unilateral primary osteoarthritis of first carpometacarpal joint, left hand: Secondary | ICD-10-CM | POA: Insufficient documentation

## 2018-10-13 ENCOUNTER — Ambulatory Visit (INDEPENDENT_AMBULATORY_CARE_PROVIDER_SITE_OTHER): Payer: Medicare HMO | Admitting: *Deleted

## 2018-10-13 DIAGNOSIS — J309 Allergic rhinitis, unspecified: Secondary | ICD-10-CM

## 2018-10-21 ENCOUNTER — Ambulatory Visit (INDEPENDENT_AMBULATORY_CARE_PROVIDER_SITE_OTHER): Payer: Medicare HMO | Admitting: *Deleted

## 2018-10-21 DIAGNOSIS — J309 Allergic rhinitis, unspecified: Secondary | ICD-10-CM | POA: Diagnosis not present

## 2018-10-25 ENCOUNTER — Ambulatory Visit (INDEPENDENT_AMBULATORY_CARE_PROVIDER_SITE_OTHER): Payer: Medicare HMO | Admitting: *Deleted

## 2018-10-25 DIAGNOSIS — J309 Allergic rhinitis, unspecified: Secondary | ICD-10-CM

## 2018-11-02 ENCOUNTER — Ambulatory Visit (INDEPENDENT_AMBULATORY_CARE_PROVIDER_SITE_OTHER): Payer: Medicare HMO | Admitting: *Deleted

## 2018-11-02 DIAGNOSIS — J309 Allergic rhinitis, unspecified: Secondary | ICD-10-CM | POA: Diagnosis not present

## 2018-11-08 ENCOUNTER — Ambulatory Visit (INDEPENDENT_AMBULATORY_CARE_PROVIDER_SITE_OTHER): Payer: Medicare HMO | Admitting: *Deleted

## 2018-11-08 DIAGNOSIS — J309 Allergic rhinitis, unspecified: Secondary | ICD-10-CM | POA: Diagnosis not present

## 2018-11-09 NOTE — Progress Notes (Signed)
VIALS EXP 11-10-2019

## 2018-11-10 DIAGNOSIS — J3089 Other allergic rhinitis: Secondary | ICD-10-CM

## 2018-11-11 DIAGNOSIS — J301 Allergic rhinitis due to pollen: Secondary | ICD-10-CM | POA: Diagnosis not present

## 2018-11-17 ENCOUNTER — Ambulatory Visit (INDEPENDENT_AMBULATORY_CARE_PROVIDER_SITE_OTHER): Payer: Medicare HMO | Admitting: *Deleted

## 2018-11-17 DIAGNOSIS — J309 Allergic rhinitis, unspecified: Secondary | ICD-10-CM

## 2018-11-25 ENCOUNTER — Ambulatory Visit (INDEPENDENT_AMBULATORY_CARE_PROVIDER_SITE_OTHER): Payer: Medicare HMO | Admitting: *Deleted

## 2018-11-25 DIAGNOSIS — J309 Allergic rhinitis, unspecified: Secondary | ICD-10-CM | POA: Diagnosis not present

## 2018-11-30 ENCOUNTER — Ambulatory Visit (INDEPENDENT_AMBULATORY_CARE_PROVIDER_SITE_OTHER): Payer: Medicare HMO | Admitting: *Deleted

## 2018-11-30 DIAGNOSIS — J309 Allergic rhinitis, unspecified: Secondary | ICD-10-CM

## 2018-12-07 ENCOUNTER — Ambulatory Visit (INDEPENDENT_AMBULATORY_CARE_PROVIDER_SITE_OTHER): Payer: Medicare HMO

## 2018-12-07 DIAGNOSIS — J309 Allergic rhinitis, unspecified: Secondary | ICD-10-CM

## 2018-12-14 ENCOUNTER — Ambulatory Visit (INDEPENDENT_AMBULATORY_CARE_PROVIDER_SITE_OTHER): Payer: Medicare HMO | Admitting: *Deleted

## 2018-12-14 DIAGNOSIS — J309 Allergic rhinitis, unspecified: Secondary | ICD-10-CM | POA: Diagnosis not present

## 2018-12-21 ENCOUNTER — Ambulatory Visit (INDEPENDENT_AMBULATORY_CARE_PROVIDER_SITE_OTHER): Payer: Medicare HMO | Admitting: *Deleted

## 2018-12-21 DIAGNOSIS — J309 Allergic rhinitis, unspecified: Secondary | ICD-10-CM

## 2018-12-27 ENCOUNTER — Ambulatory Visit (INDEPENDENT_AMBULATORY_CARE_PROVIDER_SITE_OTHER): Payer: Medicare HMO

## 2018-12-27 DIAGNOSIS — J309 Allergic rhinitis, unspecified: Secondary | ICD-10-CM

## 2019-01-06 ENCOUNTER — Ambulatory Visit (INDEPENDENT_AMBULATORY_CARE_PROVIDER_SITE_OTHER): Payer: Medicare HMO | Admitting: *Deleted

## 2019-01-06 DIAGNOSIS — J309 Allergic rhinitis, unspecified: Secondary | ICD-10-CM | POA: Diagnosis not present

## 2019-01-11 ENCOUNTER — Ambulatory Visit (INDEPENDENT_AMBULATORY_CARE_PROVIDER_SITE_OTHER): Payer: Medicare HMO | Admitting: *Deleted

## 2019-01-11 ENCOUNTER — Other Ambulatory Visit: Payer: Self-pay | Admitting: *Deleted

## 2019-01-11 DIAGNOSIS — J309 Allergic rhinitis, unspecified: Secondary | ICD-10-CM

## 2019-01-11 MED ORDER — LEVOCETIRIZINE DIHYDROCHLORIDE 5 MG PO TABS: 5.0000 mg | ORAL_TABLET | Freq: Every evening | ORAL | 5 refills | Status: DC

## 2019-01-18 ENCOUNTER — Ambulatory Visit (INDEPENDENT_AMBULATORY_CARE_PROVIDER_SITE_OTHER): Payer: Medicare HMO | Admitting: *Deleted

## 2019-01-18 DIAGNOSIS — J309 Allergic rhinitis, unspecified: Secondary | ICD-10-CM

## 2019-01-26 ENCOUNTER — Ambulatory Visit (INDEPENDENT_AMBULATORY_CARE_PROVIDER_SITE_OTHER): Payer: Medicare HMO | Admitting: *Deleted

## 2019-01-26 DIAGNOSIS — J309 Allergic rhinitis, unspecified: Secondary | ICD-10-CM

## 2019-01-31 DIAGNOSIS — J3089 Other allergic rhinitis: Secondary | ICD-10-CM | POA: Diagnosis not present

## 2019-01-31 NOTE — Progress Notes (Signed)
EXP 01/31/20

## 2019-02-01 ENCOUNTER — Ambulatory Visit (INDEPENDENT_AMBULATORY_CARE_PROVIDER_SITE_OTHER): Payer: Medicare HMO | Admitting: *Deleted

## 2019-02-01 DIAGNOSIS — J309 Allergic rhinitis, unspecified: Secondary | ICD-10-CM

## 2019-02-02 DIAGNOSIS — J301 Allergic rhinitis due to pollen: Secondary | ICD-10-CM

## 2019-02-03 ENCOUNTER — Other Ambulatory Visit: Payer: Self-pay | Admitting: Allergy and Immunology

## 2019-02-07 ENCOUNTER — Ambulatory Visit: Payer: Medicare HMO | Admitting: Allergy and Immunology

## 2019-02-08 ENCOUNTER — Encounter: Payer: Self-pay | Admitting: Allergy & Immunology

## 2019-02-08 ENCOUNTER — Other Ambulatory Visit: Payer: Self-pay

## 2019-02-08 ENCOUNTER — Ambulatory Visit (INDEPENDENT_AMBULATORY_CARE_PROVIDER_SITE_OTHER): Payer: Medicare HMO | Admitting: Allergy & Immunology

## 2019-02-08 DIAGNOSIS — J3089 Other allergic rhinitis: Secondary | ICD-10-CM | POA: Diagnosis not present

## 2019-02-08 DIAGNOSIS — J4540 Moderate persistent asthma, uncomplicated: Secondary | ICD-10-CM | POA: Diagnosis not present

## 2019-02-08 DIAGNOSIS — G8929 Other chronic pain: Secondary | ICD-10-CM

## 2019-02-08 MED ORDER — LEVOCETIRIZINE DIHYDROCHLORIDE 5 MG PO TABS: 5.0000 mg | ORAL_TABLET | Freq: Every evening | ORAL | 5 refills | Status: DC

## 2019-02-08 MED ORDER — MONTELUKAST SODIUM 10 MG PO TABS: 10.0000 mg | ORAL_TABLET | Freq: Every day | ORAL | 5 refills | Status: DC

## 2019-02-08 MED ORDER — FLUTICASONE PROPIONATE 50 MCG/ACT NA SUSP: 1.0000 | Freq: Every day | NASAL | 5 refills | Status: DC

## 2019-02-08 NOTE — Progress Notes (Signed)
RE: Sheila Vincent MRN: [id number] DOB: 11-21-1960 Date of Telemedicine Visit: 02/08/2019  Referring provider: Elwyn Reach, MD Primary care provider: Elwyn Reach, MD  Chief Complaint: Asthma   Telemedicine Follow Up Visit via Telephone: I connected with Sheila Vincent for a follow up on 02/08/19 by telephone and verified that I am speaking with the correct person using two identifiers.   I discussed the limitations, risks, security and privacy concerns of performing an evaluation and management service by telephone and the availability of in person appointments. I also discussed with the patient that there may be a patient responsible charge related to this service. The patient expressed understanding and agreed to proceed.  Patient is at home accompanied by herself who provided/contributed to the history.  Provider is at the office.  Visit start time: 10:50 AM Visit end time: 11:17 AM Insurance consent/check in by: Sheila Vincent Medical consent and medical assistant/nurse: Sheila Vincent  History of Present Illness:  She is a 58 y.o. female, who is being followed for persistent asthma as well as allergic rhinitis. Her previous allergy office visit was in November 2019 with Sheila Vincent.   Since the last visit, she has done well from an atopic perspective.   Asthma/Respiratory Symptom History: Her asthma is well controlled. She remains on the Flovent two puffs twice daily. She does not use a spacer at all. She does use the montelukast. The last time that she needed prednisone was months ago (not at the last visit at all). She denies night time coughing. Heat makes her asthma flare and when she "gets excited". She will have some trouble with physical activity. She rarely does that.  Allergic Rhinitis Symptom History: She remains on her allergen immunotherapy. She gets her shots in [place]. She is getting two injections once weekly. She started these around the middle of last year. She  does feel that her symptoms are better controlled with the current regimen. She is able to get outside more.   She is also concerned with some pain from carpal tunnel today. She tells me that she has had ongoing pain despite Tramadol as well as cortisone injections. Her PCP apparently will not prescribe any stronger pain medication and she is requesting something "stronger".   Otherwise, there have been no changes to her past medical history, surgical history, family history, or social history.  Assessment and Plan:  Sheila Vincent is a 58 y.o. female with:  Moderate persistent asthma, uncomplicated  Perennial and seasonal allergic rhinitis (grasses, ragweed, weeds, molds, cat, dog, dust mite)  Chronic pain  Disabled status    1. Moderate persistent asthma, uncomplicated - We are not going to make any asthma medication changes at this time. - Spacer use reviewed. - Daily controller medication(s): Flovent 110mcg 2 puffs twice daily with spacer - Prior to physical activity: albuterol 2 puffs 10-15 minutes before physical activity. - Rescue medications: albuterol 4 puffs every 4-6 hours as needed - Asthma control goals:  * Full participation in all desired activities (may need albuterol before activity) * Albuterol use two time or less a week on average (not counting use with activity) * Cough interfering with sleep two time or less a month * Oral steroids no more than once a year * No hospitalizations  2. Perennial and seasonal allergic rhinitis (grasses, ragweed, weeds, molds, cat, dog, dust mite) - Continue with allergy shots at the same schedule. - Continue with fluticasone nasal spray daily to help control runny nose. - Continue with azelastine nasal  spray one spray per nostril up to twice daily as needed.  3. Chronic pain - We do not prescribe pain medicaetions. - I did put in a referral for Physical Medicine Rehab to pain management.   4. Return in about 6 months (around  08/10/2019). This can be an in-person, a virtual Webex or a telephone follow up visit.   Diagnostics: None.  Medication List:  Current Outpatient Medications  Medication Sig Dispense Refill  . albuterol (PROVENTIL HFA;VENTOLIN HFA) 108 (90 Base) MCG/ACT inhaler Inhale 2 puffs into the lungs every 4 (four) hours as needed for wheezing or shortness of breath. 1 Inhaler 0  . azelastine (ASTELIN) 0.1 % nasal spray Place 2 sprays into both nostrils 2 (two) times daily. 30 mL 5  . famotidine (PEPCID) 20 MG tablet Take 1 tablet (20 mg total) by mouth 2 (two) times daily. 60 tablet 5  . fluticasone (FLOVENT HFA) 110 MCG/ACT inhaler Inhale 2 puffs into the lungs 2 (two) times daily. 1 Inhaler 5  . omeprazole (PRILOSEC) 40 MG capsule TAKE 1 CAPSULE(40 MG) BY MOUTH DAILY 30 capsule 5  . fluticasone (FLONASE) 50 MCG/ACT nasal spray Place 1 spray into both nostrils daily for 30 days. 16 g 5  . levocetirizine (XYZAL) 5 MG tablet Take 1 tablet (5 mg total) by mouth every evening for 30 days. 30 tablet 5  . montelukast (SINGULAIR) 10 MG tablet Take 1 tablet (10 mg total) by mouth at bedtime for 30 days. 30 tablet 5   No current facility-administered medications for this visit.    Allergies: Allergies  Allergen Reactions  . Bactrim Anxiety    Nervous,"cant sit still", paranoid   I reviewed her past medical history, social history, family history, and environmental history and no significant changes have been reported from previous visits.  Review of Systems  Constitutional: Negative for activity change and appetite change.  HENT: Negative for congestion, nosebleeds, postnasal drip, rhinorrhea, sinus pressure, sinus pain, sneezing and sore throat.   Eyes: Negative for pain, discharge, redness and itching.  Respiratory: Negative for shortness of breath, wheezing and stridor.   Gastrointestinal: Negative for diarrhea, nausea and vomiting.  Musculoskeletal: Positive for myalgias. Negative for arthralgias  and joint swelling.       Positive for wrist pain secondary to carpal tunnel.   Skin: Negative for rash.  Allergic/Immunologic: Negative for environmental allergies and food allergies.    Objective:  Physical exam not obtained as encounter was done via telephone.   Previous notes and tests were reviewed.  I discussed the assessment and treatment plan with the patient. The patient was provided an opportunity to ask questions and all were answered. The patient agreed with the plan and demonstrated an understanding of the instructions.   The patient was advised to call back or seek an in-person evaluation if the symptoms worsen or if the condition fails to improve as anticipated.  I provided 27 minutes of non-face-to-face time during this encounter.  It was my pleasure to participate in [place] care today. Please feel free to contact me with any questions or concerns.   Sincerely,  Valentina Shaggy, MD

## 2019-02-08 NOTE — Patient Instructions (Addendum)
1. Moderate persistent asthma, uncomplicated - We are not going to make any asthma medication changes at this time. - Spacer use reviewed. - Daily controller medication(s): Flovent 168mcg 2 puffs twice daily with spacer - Prior to physical activity: albuterol 2 puffs 10-15 minutes before physical activity. - Rescue medications: albuterol 4 puffs every 4-6 hours as needed - Asthma control goals:  * Full participation in all desired activities (may need albuterol before activity) * Albuterol use two time or less a week on average (not counting use with activity) * Cough interfering with sleep two time or less a month * Oral steroids no more than once a year * No hospitalizations  2. Perennial and seasonal allergic rhinitis - Continue with allergy shots at the same schedule. - Continue with fluticasone nasal spray daily to help control runny nose. - Continue with azelastine nasal spray one spray per nostril up to twice daily as needed.  3. Chronic pain - We do not prescribe pain medicaetions. - I did put in a referral for Physical Medicine Rehab to pain management.   4. Return in about 6 months (around 08/10/2019). This can be an in-person, a virtual Webex or a telephone follow up visit.   Please inform us of any Emergency Department visits, hospitalizations, or changes in symptoms. Call us before going to the ED for breathing or allergy symptoms since we might be able to fit you in for a sick visit. Feel free to contact us anytime with any questions, problems, or concerns.  It was a pleasure to talk to you today today!  Websites that have reliable patient information: 1. American Academy of Asthma, Allergy, and Immunology: www.aaaai.org 2. Food Allergy Research and Education (FARE): foodallergy.org 3. Mothers of Asthmatics: http://www.asthmacommunitynetwork.org 4. American College of Allergy, Asthma, and Immunology: www.acaai.org  "Like" [place] on Facebook and Instagram for our latest  updates!      Make sure you are registered to vote! If you have moved or changed any of your contact information, you will need to get this updated before voting!    Voter ID laws are NOT going into effect for the General Election in November 2020! DO NOT let this stop you from exercising your right to vote!

## 2019-02-09 ENCOUNTER — Telehealth: Payer: Self-pay

## 2019-02-09 ENCOUNTER — Ambulatory Visit (INDEPENDENT_AMBULATORY_CARE_PROVIDER_SITE_OTHER): Payer: Medicare HMO | Admitting: *Deleted

## 2019-02-09 DIAGNOSIS — J309 Allergic rhinitis, unspecified: Secondary | ICD-10-CM

## 2019-02-09 NOTE — Telephone Encounter (Signed)
Referral has been placed to the right workque.  Thanks

## 2019-02-09 NOTE — Telephone Encounter (Signed)
-----   Message from Valentina Shaggy, MD sent at 02/08/2019 11:13 AM EDT ----- [facility] referral placed!

## 2019-02-10 NOTE — Telephone Encounter (Signed)
Thanks, Dee! 

## 2019-02-10 NOTE — Telephone Encounter (Signed)
Please Allow 4-6 week for review to schedule with their office per referral note.

## 2019-02-11 NOTE — Telephone Encounter (Signed)
Sounds good.   Salvatore Marvel, MD [facility]

## 2019-02-15 ENCOUNTER — Ambulatory Visit (INDEPENDENT_AMBULATORY_CARE_PROVIDER_SITE_OTHER): Payer: Medicare HMO

## 2019-02-15 DIAGNOSIS — J309 Allergic rhinitis, unspecified: Secondary | ICD-10-CM | POA: Diagnosis not present

## 2019-02-21 ENCOUNTER — Ambulatory Visit (INDEPENDENT_AMBULATORY_CARE_PROVIDER_SITE_OTHER): Payer: Medicare HMO

## 2019-02-21 DIAGNOSIS — J309 Allergic rhinitis, unspecified: Secondary | ICD-10-CM | POA: Diagnosis not present

## 2019-02-23 DIAGNOSIS — M654 Radial styloid tenosynovitis [de Quervain]: Secondary | ICD-10-CM | POA: Insufficient documentation

## 2019-02-28 ENCOUNTER — Ambulatory Visit (INDEPENDENT_AMBULATORY_CARE_PROVIDER_SITE_OTHER): Payer: Medicare HMO | Admitting: *Deleted

## 2019-02-28 DIAGNOSIS — J309 Allergic rhinitis, unspecified: Secondary | ICD-10-CM | POA: Diagnosis not present

## 2019-03-08 ENCOUNTER — Encounter: Payer: Self-pay | Admitting: Physical Medicine & Rehabilitation

## 2019-03-08 ENCOUNTER — Other Ambulatory Visit: Payer: Self-pay | Admitting: *Deleted

## 2019-03-08 ENCOUNTER — Ambulatory Visit (INDEPENDENT_AMBULATORY_CARE_PROVIDER_SITE_OTHER): Payer: Medicare HMO | Admitting: *Deleted

## 2019-03-08 ENCOUNTER — Telehealth: Payer: Self-pay | Admitting: *Deleted

## 2019-03-08 DIAGNOSIS — J309 Allergic rhinitis, unspecified: Secondary | ICD-10-CM | POA: Diagnosis not present

## 2019-03-08 NOTE — Telephone Encounter (Signed)
Called and spoke with patient and informed. Patient will be by tomorrow to pick up some samples of Allegra to try.

## 2019-03-08 NOTE — Telephone Encounter (Signed)
To avoid diminishing benefit with daily use (tachyphylaxis) of second generation antihistamine, consider alternating every few months between fexofenadine (Allegra) and levocetirizine (Xyzal). I would also recommend using nasal saline spray frequently throughout the day to remove pollen from the nasal passageways.

## 2019-03-08 NOTE — Telephone Encounter (Signed)
Patient came in to get her allergy injection and states that she has been sneezing a lot more often. She states that she has been taking her Xyzal daily, and she has been using her Flonase and Astelin accordingly. Please advise. Pt has not been having any issues with her allergy injections.

## 2019-03-16 ENCOUNTER — Ambulatory Visit (INDEPENDENT_AMBULATORY_CARE_PROVIDER_SITE_OTHER): Payer: Medicare HMO

## 2019-03-16 DIAGNOSIS — J309 Allergic rhinitis, unspecified: Secondary | ICD-10-CM

## 2019-03-21 ENCOUNTER — Ambulatory Visit (INDEPENDENT_AMBULATORY_CARE_PROVIDER_SITE_OTHER): Payer: Medicare HMO

## 2019-03-21 DIAGNOSIS — J309 Allergic rhinitis, unspecified: Secondary | ICD-10-CM | POA: Diagnosis not present

## 2019-03-21 NOTE — Telephone Encounter (Signed)
Awesome - thanks for the update, Sheila Vincent!   Salvatore Marvel, MD [facility]

## 2019-03-21 NOTE — Telephone Encounter (Signed)
Patient is down for 04/08/19

## 2019-04-04 ENCOUNTER — Ambulatory Visit (INDEPENDENT_AMBULATORY_CARE_PROVIDER_SITE_OTHER): Payer: Medicare HMO

## 2019-04-04 DIAGNOSIS — J309 Allergic rhinitis, unspecified: Secondary | ICD-10-CM

## 2019-04-08 ENCOUNTER — Ambulatory Visit: Payer: Medicare HMO | Admitting: Physical Medicine & Rehabilitation

## 2019-04-12 ENCOUNTER — Other Ambulatory Visit: Payer: Self-pay | Admitting: Allergy and Immunology

## 2019-04-18 ENCOUNTER — Ambulatory Visit (INDEPENDENT_AMBULATORY_CARE_PROVIDER_SITE_OTHER): Payer: Medicare HMO | Admitting: *Deleted

## 2019-04-18 DIAGNOSIS — J309 Allergic rhinitis, unspecified: Secondary | ICD-10-CM

## 2019-04-18 MED ORDER — TRIAMCINOLONE ACETONIDE 0.5 % EX OINT: 1.0000 "application " | TOPICAL_OINTMENT | Freq: Two times a day (BID) | CUTANEOUS | 0 refills | Status: DC

## 2019-04-21 ENCOUNTER — Ambulatory Visit (INDEPENDENT_AMBULATORY_CARE_PROVIDER_SITE_OTHER): Payer: Medicare HMO | Admitting: Allergy

## 2019-04-21 ENCOUNTER — Other Ambulatory Visit: Payer: Self-pay

## 2019-04-21 ENCOUNTER — Encounter: Payer: Self-pay | Admitting: Allergy

## 2019-04-21 VITALS — BP 146/78 | HR 88 | Temp 97.6°F | Resp 20

## 2019-04-21 DIAGNOSIS — J4540 Moderate persistent asthma, uncomplicated: Secondary | ICD-10-CM

## 2019-04-21 DIAGNOSIS — R011 Cardiac murmur, unspecified: Secondary | ICD-10-CM | POA: Diagnosis not present

## 2019-04-21 DIAGNOSIS — W57XXXA Bitten or stung by nonvenomous insect and other nonvenomous arthropods, initial encounter: Secondary | ICD-10-CM | POA: Insufficient documentation

## 2019-04-21 DIAGNOSIS — J3089 Other allergic rhinitis: Secondary | ICD-10-CM | POA: Diagnosis not present

## 2019-04-21 DIAGNOSIS — W57XXXD Bitten or stung by nonvenomous insect and other nonvenomous arthropods, subsequent encounter: Secondary | ICD-10-CM | POA: Diagnosis not present

## 2019-04-21 NOTE — Progress Notes (Signed)
Follow Up Note  RE: Sheila Vincent Vincent MRN: [id number] DOB: Jun 14, 1961 Date of Office Visit: 04/21/2019  Referring provider: Elwyn Reach, MD Primary care provider: Elwyn Reach, MD  Chief Complaint: Rash  History of Present Illness: I had the pleasure of seeing Sheila Vincent Vincent for a follow up visit at the [facility] on 04/21/2019. She is a 58 y.o. female, who is being followed for asthma, allergic rhinitis. Today she is here for new complaint of rash. Her previous allergy office visit was on 02/08/2019 with Sheila Vincent Vincent.   Rash: Rash started about a few months ago. Mainly occurs on her legs where she was bit by ticks. Describes them as erythematous which hive up at times and is extremely pruritic. Individual rashes lasts about a few weeks. No ecchymosis upon resolution. Associated symptoms include: none. Suspected triggers are tick bites. She has tried the following therapies: triamcinolone cream with unknown benefit. She also used peroxide and alcohol.   Asthma: Not using Flovent daily as she is prescribed.  Gets some shortness of breath a few times a week and using albuterol with good benefit.  Has some swelling in the legs at times.  Has not seen PCP for regular check up.   Allergic rhinitis:  Tolerating allergy injections.   Assessment and Plan: Sheila Vincent Vincent is a 58 y.o. female with: Bug bite Rash is most likely secondary to insect/bug bite. Gave handout or proper care after getting bit.  Monitor the rash. If it gets worse let us know.   Use topical triamcinolone cream twice a day as needed.  May use benadryl 84m as needed for itching every 4-6 hours.  Continue xyzal daily.   Moderate persistent asthma Not-well controlled but patient not using daily Flovent.  - Daily controller medication(s): START Flovent 1157m 2 puffs twice daily with spacer. - Prior to physical activity: albuterol 2 puffs 10-15 minutes before physical activity. - Rescue  medications: albuterol 4 puffs every 4-6 hours as needed. - Get spirometry at next visit.   Murmur Loud cardiac murmur heard on exam. Patient not aware of this. Advised her to go see PCP for further evaluation as there is concern for her SOB maybe cardiac related and there was slight lower extremity edema on exam today as well.   Perennial and seasonal allergic rhinitis Stable. - Continue with allergy shots at the same schedule. - Continue with fluticasone nasal spray daily to help control runny nose. - Continue with azelastine nasal spray one spray per nostril up to twice daily as needed. - Continue xyzal 58m16maily.   Return in about 4 weeks (around 05/19/2019).  Diagnostics: None.  Medication List:  Current Outpatient Medications  Medication Sig Dispense Refill  . albuterol (PROVENTIL HFA;VENTOLIN HFA) 108 (90 Base) MCG/ACT inhaler Inhale 2 puffs into the lungs every 4 (four) hours as needed for wheezing or shortness of breath. 1 Inhaler 0  . azelastine (ASTELIN) 0.1 % nasal spray Place 2 sprays into both nostrils 2 (two) times daily. 30 mL 5  . famotidine (PEPCID) 20 MG tablet Take 1 tablet (20 mg total) by mouth 2 (two) times daily. 60 tablet 5  . fluticasone (FLOVENT HFA) 110 MCG/ACT inhaler Inhale 2 puffs into the lungs 2 (two) times daily. 1 Inhaler 5  . levocetirizine (XYZAL) 5 MG tablet TAKE 1 TABLET BY MOUTH EVERY EVENING 30 tablet 5  . omeprazole (PRILOSEC) 40 MG capsule TAKE 1 CAPSULE(40 MG) BY MOUTH DAILY 30 capsule 5  . triamcinolone  ointment (KENALOG) 0.5 % Apply 1 application topically 2 (two) times daily. 30 g 0  . fluticasone (FLONASE) 50 MCG/ACT nasal spray Place 1 spray into both nostrils daily for 30 days. 16 g 5  . montelukast (SINGULAIR) 10 MG tablet Take 1 tablet (10 mg total) by mouth at bedtime for 30 days. 30 tablet 5   No current facility-administered medications for this visit.    Allergies: Allergies  Allergen Reactions  . Bactrim Anxiety     Nervous,"cant sit still", paranoid   I reviewed her past medical history, social history, family history, and environmental history and no significant changes have been reported from previous visit on 02/08/2019.  Review of Systems  Constitutional: Negative for appetite change, chills, fever and unexpected weight change.  HENT: Negative for congestion and rhinorrhea.   Eyes: Negative for itching.  Respiratory: Negative for cough, chest tightness, shortness of breath and wheezing.   Gastrointestinal: Negative for abdominal pain.  Skin: Positive for rash.  Allergic/Immunologic: Positive for environmental allergies.  Neurological: Negative for headaches.   Objective: BP (!) 146/78 (BP Location: Left Arm, Patient Position: Sitting, Cuff Size: Normal)   Pulse 88   Temp 97.6 F (36.4 C) (Temporal)   Resp 20   SpO2 98%  There is no height or weight on file to calculate BMI. Physical Exam  Constitutional: She is oriented to person, place, and time. She appears well-developed and well-nourished.  HENT:  Head: Normocephalic and atraumatic.  Right Ear: External ear normal.  Left Ear: External ear normal.  Nose: Nose normal.  Mouth/Throat: Oropharynx is clear and moist.  Eyes: Conjunctivae and EOM are normal.  Neck: Neck supple.  Cardiovascular: Normal rate and regular rhythm. Exam reveals no gallop and no friction rub.  Murmur heard. Pulmonary/Chest: Effort normal and breath sounds normal. She has no wheezes. She has no rales.  Musculoskeletal:        General: Edema (trace lower extremity b/l) present.  Neurological: She is alert and oriented to person, place, and time.  Skin: Skin is warm. Rash noted.  Flat 2-58m erythematous circular rash on anterior lower shin area b/l.  Psychiatric: She has a normal mood and affect. Her behavior is normal.  Nursing note and vitals reviewed.  Previous notes and tests were reviewed. The plan was reviewed with the patient/family, and all  questions/concerned were addressed.  It was my pleasure to see Sheila Vincent Vincent and participate in her care. Please feel free to contact me with any questions or concerns.  Sincerely,  YRexene Alberts DO Allergy & Immunology  [facility] [phone]: [phone]

## 2019-04-21 NOTE — Patient Instructions (Addendum)
Monitor the rash. Use topical triamcinolone cream twice a day as needed. May use benadryl 25mg  as needed for itching every 4-6 hours. Continue xyzal daily.   FOLLOW UP WITH YOUR PCP REGARDING your heart murmur and shortness of breath.   1. Moderate persistent asthma, uncomplicated - Daily controller medication(s): START Flovent 172mcg 2 puffs twice daily with spacer - Prior to physical activity: albuterol 2 puffs 10-15 minutes before physical activity. - Rescue medications: albuterol 4 puffs every 4-6 hours as needed - Asthma control goals:  * Full participation in all desired activities (may need albuterol before activity) * Albuterol use two time or less a week on average (not counting use with activity) * Cough interfering with sleep two time or less a month * Oral steroids no more than once a year * No hospitalizations  2. Perennial and seasonal allergic rhinitis (grasses, ragweed, weeds, molds, cat, dog, dust mite) - Continue with allergy shots at the same schedule. - Continue with fluticasone nasal spray daily to help control runny nose. - Continue with azelastine nasal spray one spray per nostril up to twice daily as needed.  Follow up in 4 weeks for asthma check.

## 2019-04-21 NOTE — Assessment & Plan Note (Signed)
Loud cardiac murmur heard on exam. Patient not aware of this. Advised her to go see PCP for further evaluation as there is concern for her SOB maybe cardiac related and there was slight lower extremity edema on exam today as well.

## 2019-04-21 NOTE — Assessment & Plan Note (Signed)
Rash is most likely secondary to insect/bug bite. Gave handout or proper care after getting bit.  Monitor the rash. If it gets worse let us know.   Use topical triamcinolone cream twice a day as needed.  May use benadryl 25mg  as needed for itching every 4-6 hours.  Continue xyzal daily.

## 2019-04-21 NOTE — Assessment & Plan Note (Signed)
Stable. - Continue with allergy shots at the same schedule. - Continue with fluticasone nasal spray daily to help control runny nose. - Continue with azelastine nasal spray one spray per nostril up to twice daily as needed. - Continue xyzal 24m daily.

## 2019-04-21 NOTE — Assessment & Plan Note (Signed)
Not-well controlled but patient not using daily Flovent.  - Daily controller medication(s): START Flovent 186mcg 2 puffs twice daily with spacer. - Prior to physical activity: albuterol 2 puffs 10-15 minutes before physical activity. - Rescue medications: albuterol 4 puffs every 4-6 hours as needed. - Get spirometry at next visit.

## 2019-05-04 ENCOUNTER — Ambulatory Visit (INDEPENDENT_AMBULATORY_CARE_PROVIDER_SITE_OTHER): Payer: Medicare HMO | Admitting: *Deleted

## 2019-05-04 ENCOUNTER — Encounter: Payer: Self-pay | Admitting: Allergy and Immunology

## 2019-05-04 ENCOUNTER — Ambulatory Visit (INDEPENDENT_AMBULATORY_CARE_PROVIDER_SITE_OTHER): Payer: Medicare HMO | Admitting: Allergy and Immunology

## 2019-05-04 DIAGNOSIS — J4540 Moderate persistent asthma, uncomplicated: Secondary | ICD-10-CM

## 2019-05-04 DIAGNOSIS — J309 Allergic rhinitis, unspecified: Secondary | ICD-10-CM | POA: Diagnosis not present

## 2019-05-04 DIAGNOSIS — J3089 Other allergic rhinitis: Secondary | ICD-10-CM

## 2019-05-04 DIAGNOSIS — W57XXXD Bitten or stung by nonvenomous insect and other nonvenomous arthropods, subsequent encounter: Secondary | ICD-10-CM | POA: Diagnosis not present

## 2019-05-04 MED ORDER — MOMETASONE FUROATE 0.1 % EX OINT: TOPICAL_OINTMENT | CUTANEOUS | 3 refills | Status: DC

## 2019-05-04 MED ORDER — PREDNISONE 10 MG PO TABS: ORAL_TABLET | ORAL | 0 refills | Status: DC

## 2019-05-04 NOTE — Assessment & Plan Note (Signed)
   Continue appropriate allergen avoidance measures, immunotherapy injections per protocol, fluticasone nasal spray as needed, and azelastine nasal spray as needed.  Nasal saline lavage (NeilMed) has been recommended as needed and prior to medicated nasal sprays along with instructions for proper administration.  For thick post nasal drainage, add guaifenesin 1200 mg (Mucinex Maximum Strength)  twice daily as needed with adequate hydration as discussed.

## 2019-05-04 NOTE — Assessment & Plan Note (Signed)
The history, distribution, and appearance of lesions suggest insect bites.    I have recommended evaluation of the home and bedroom for insects, particularly Cimex lectularius (bed bugs) and Siphonaptera (fleas), by a Manufacturing engineer.  Prescription has been provided for prednisone, 40 mg x3 days, 20 mg x1 day, 10 mg x1 day, then stop.  A prescription has been provided for mometasone 0.1% ointment sparingly to affected areas once daily as needed.  Continue xyzal daily.   If dermatitis persists or progresses despite treatment plan as outlined above, dermatology evaluation with biopsy may be helpful in establishing the etiology.

## 2019-05-04 NOTE — Assessment & Plan Note (Signed)
Stable.  Continue Flovent 110 micro grams, 2 inhalations via spacer device twice daily, and albuterol HFA, 1 to 2 inhalations every 4-6 hours as needed and 15 minutes prior to exercise.  Subjective and objective measures of pulmonary function will be followed and the treatment plan will be adjusted accordingly.

## 2019-05-04 NOTE — Patient Instructions (Addendum)
Bug bite The history, distribution, and appearance of lesions suggest insect bites.    I have recommended evaluation of the home and bedroom for insects, particularly Cimex lectularius (bed bugs) and Siphonaptera (fleas), by a Manufacturing engineer.  Prescription has been provided for prednisone, 40 mg x3 days, 20 mg x1 day, 10 mg x1 day, then stop.  A prescription has been provided for mometasone 0.1% ointment sparingly to affected areas once daily as needed.  Continue xyzal daily.   If dermatitis persists or progresses despite treatment plan as outlined above, dermatology evaluation with biopsy may be helpful in establishing the etiology.  Moderate persistent asthma Stable.  Continue Flovent 110 micro grams, 2 inhalations via spacer device twice daily, and albuterol HFA, 1 to 2 inhalations every 4-6 hours as needed and 15 minutes prior to exercise.  Subjective and objective measures of pulmonary function will be followed and the treatment plan will be adjusted accordingly.  Perennial and seasonal allergic rhinitis  Continue appropriate allergen avoidance measures, immunotherapy injections per protocol, fluticasone nasal spray as needed, and azelastine nasal spray as needed.  Nasal saline lavage (NeilMed) has been recommended as needed and prior to medicated nasal sprays along with instructions for proper administration.  For thick post nasal drainage, add guaifenesin 1200 mg (Mucinex Maximum Strength)  twice daily as needed with adequate hydration as discussed.   Return in about 4 months (around 09/04/2019), or if symptoms worsen or fail to improve.

## 2019-05-04 NOTE — Progress Notes (Addendum)
Follow-up Telemedicine Note  RE: Sheila Vincent MRN: [id number] DOB: 1961-04-04 Date of Telemedicine Visit: 05/04/2019  Primary care provider: Elwyn Reach, MD Referring provider: Elwyn Reach, MD  Telemedicine Follow Up Visit via Telephone: I connected with Sheila Vincent for a follow up on 05/04/19 by telephone and verified that I am speaking with the correct person using two identifiers.   The limitations, risks, security and privacy concerns of performing an evaluation and management service by telemedicine, the availability of in person appointments, and that there may be a patient responsible charge related to this service were discussed. The patient expressed understanding and agreed to proceed.  Patient is at home.   Provider is at the office.  Visit start time: 1:37 PM Visit end time: 2:20 PM Insurance consent/check in by: [facility] consent and medical assistant/nurse: Logan  History of present illness: Sheila Vincent is a 58 y.o. female with asthma, allergic rhinitis, and rash presenting today for an acute visit via telemedicine.  She was last seen in this clinic on April 21, 2019 by Dr. [name].  At that time, she was evaluated for a skin rash which was suspected to be secondary to bug bites.  She reports that the rash "started out with tick bites" in March.  Since March, she has found and removed approximately 20 ticks from her body.  Yesterday she "found a row of bumps" on her forearms which were "burning and itching really bad."  She reports that the rash appears to be bug bites and that she has "bites all over."  [place]  She reports that approximately 1 year ago she had her mattress checked approximately for bedbugs, without evidence of bedbugs at that time.  She has cats which come into the home, however states that she uses flea medication on the cats.  The patient emailed a picture of the rash.  The rash consists of several erythematous  papules on the volar aspect of the left forearm. She currently takes Flovent 110 micro grams, 2 inhalations via spacer device twice daily.  While on this regimen, she requires albuterol rescue 1 time every 1 to 2 weeks on average.  She reports that despite compliance with fluticasone nasal spray and azelastine nasal spray she is still experiencing thick postnasal drainage.  She has no other nasal/sinus symptom complaints today.  Assessment and plan: Bug bite The history, distribution, and appearance of lesions suggest insect bites.    I have recommended evaluation of the home and bedroom for insects, particularly Cimex lectularius (bed bugs) and Siphonaptera (fleas), by a Manufacturing engineer.  Prescription has been provided for prednisone, 40 mg x3 days, 20 mg x1 day, 10 mg x1 day, then stop.  A prescription has been provided for mometasone 0.1% ointment sparingly to affected areas once daily as needed.  Continue xyzal daily.   If dermatitis persists or progresses despite treatment plan as outlined above, dermatology evaluation with biopsy may be helpful in establishing the etiology.  Moderate persistent asthma Stable.  Continue Flovent 110 micro grams, 2 inhalations via spacer device twice daily, and albuterol HFA, 1 to 2 inhalations every 4-6 hours as needed and 15 minutes prior to exercise.  Subjective and objective measures of pulmonary function will be followed and the treatment plan will be adjusted accordingly.  Perennial and seasonal allergic rhinitis  Continue appropriate allergen avoidance measures, immunotherapy injections per protocol, fluticasone nasal spray as needed, and azelastine nasal spray as needed.  Nasal saline  lavage (NeilMed) has been recommended as needed and prior to medicated nasal sprays along with instructions for proper administration.  For thick post nasal drainage, add guaifenesin 1200 mg (Mucinex Maximum Strength)  twice daily as needed with  adequate hydration as discussed.   Meds ordered this encounter  Medications   mometasone (ELOCON) 0.1 % ointment    Sig: Apply sparingly to affected areas once daily as needed    Dispense:  45 g    Refill:  3   predniSONE (DELTASONE) 10 MG tablet    Sig: Take 4 tablets (50m) daily for 3 days, then 2 tablets (265m daily on day 4, then 1 tablet (1043mon day 5, then stop.    Dispense:  15 tablet    Refill:  0    Diagnostics: None.   Physical examination: Physical Exam Not obtained as encounter was done via telephone.  A picture was emailed revealing several erythematous papules on the volar aspect of the left forearm.  The following portions of the patient's history were reviewed and updated as appropriate: allergies, current medications, past family history, past medical history, past social history, past surgical history and problem list.  Allergies as of 05/04/2019      Reactions   Bactrim Anxiety   Nervous,"cant sit still", paranoid      Medication List       Accurate as of May 04, 2019  2:49 PM. If you have any questions, ask your nurse or doctor.        albuterol 108 (90 Base) MCG/ACT inhaler Commonly known as: VENTOLIN HFA Inhale 2 puffs into the lungs every 4 (four) hours as needed for wheezing or shortness of breath.   azelastine 0.1 % nasal spray Commonly known as: ASTELIN Place 2 sprays into both nostrils 2 (two) times daily.   famotidine 20 MG tablet Commonly known as: PEPCID Take 1 tablet (20 mg total) by mouth 2 (two) times daily.   fluticasone 110 MCG/ACT inhaler Commonly known as: Flovent HFA Inhale 2 puffs into the lungs 2 (two) times daily.   fluticasone 50 MCG/ACT nasal spray Commonly known as: FLONASE Place 1 spray into both nostrils daily for 30 days.   hydrochlorothiazide 25 MG tablet Commonly known as: HYDRODIURIL TK 1 T PO QD   ibuprofen 800 MG tablet Commonly known as: ADVIL TK 1 T PO Q 8 H PRN P   levocetirizine 5 MG  tablet Commonly known as: XYZAL TAKE 1 TABLET BY MOUTH EVERY EVENING   mometasone 0.1 % ointment Commonly known as: ELOCON Apply sparingly to affected areas once daily as needed Started by: R CEdmonia LynchD   montelukast 10 MG tablet Commonly known as: SINGULAIR Take 1 tablet (10 mg total) by mouth at bedtime for 30 days.   NON FORMULARY Allergy Injections   omeprazole 40 MG capsule Commonly known as: PRILOSEC TAKE 1 CAPSULE(40 MG) BY MOUTH DAILY   predniSONE 10 MG tablet Commonly known as: DELTASONE Take 4 tablets (8m53maily for 3 days, then 2 tablets (20mg72mily on day 4, then 1 tablet (10mg)73mday 5, then stop. Started by: R CartEdmonia Lynch traMADol 50 MG tablet Commonly known as: ULTRAM TK 1 T PO Q 6 H PRN   triamcinolone ointment 0.5 % Commonly known as: KENALOG Apply 1 application topically 2 (two) times daily. What changed: additional instructions       Allergies  Allergen Reactions   Bactrim Anxiety    Nervous,"cant sit still", paranoid  Previous notes and tests were reviewed.  I discussed the assessment and treatment plan with the patient. The patient was provided an opportunity to ask questions and all were answered. The patient agreed with the plan and demonstrated an understanding of the instructions.   The patient was advised to call back or seek an in-person evaluation if the symptoms worsen or if the condition fails to improve as anticipated.  I provided 43 minutes of non-face-to-face time during this encounter.  I appreciate the opportunity to take part in Kim's care. Please do not hesitate to contact me with questions.  Sincerely,   R. Edgar Frisk, MD

## 2019-05-11 DIAGNOSIS — J3089 Other allergic rhinitis: Secondary | ICD-10-CM | POA: Diagnosis not present

## 2019-05-11 NOTE — Progress Notes (Signed)
VIALS EXP 05-10-2020

## 2019-05-12 DIAGNOSIS — J301 Allergic rhinitis due to pollen: Secondary | ICD-10-CM

## 2019-05-17 ENCOUNTER — Telehealth: Payer: Self-pay | Admitting: *Deleted

## 2019-05-17 NOTE — Telephone Encounter (Signed)
Patient came in today to show progress of rash on her arm. She states that it has completely cleared up on her legs but it has become worse on her arm. She states that she has finished her course of prednisone as prescribed and has been applying triamcinolone ointment two times daily to the rash. She states that there is also swelling on the top of her arm where the rash is. She states that she has been doing her best not to scratch it. Advised to patient to avoid scratching as much as possible to avoid infection. With the patient permission and request 2 pictures have been sent through work e-mail to show the progression of her rash. Please advise.

## 2019-05-17 NOTE — Telephone Encounter (Signed)
She is welcome to send pics, but I think seeing a dermatologist is her best strategy at this point given the persistence of this rash. Please send in a prescription for mometasone 0.1% ointment, to affected areas once daily as needed.  Mometasone is not to be used on the face, neck, axillae, or groin areas. Thanks.

## 2019-05-17 NOTE — Telephone Encounter (Addendum)
Patient called back advised as written per Dr Verlin Fester patient states that is not helping advised she can go back to dermatology for eval she agreed with this.   Patient is wondering if she can get allergy shots since the rash is on her arms advised it is best to wait for rash to calm down prior to administering therapy patient is not ok with this answer and wants to get allergy shots. Dr Verlin Fester please advise if she can get shots during rash flare?

## 2019-05-17 NOTE — Telephone Encounter (Signed)
Called patient left voice message to return call. Mometasone ointment was already sent in on 05/04/2019 by Dr Verlin Fester

## 2019-05-17 NOTE — Telephone Encounter (Signed)
Is the rash still on her forearms, or has it progressed to her upper arms. Unless it has progressed to the upper arms, she may continue to receive allergy injections. Just have the injection nurse make note of what the rash looks like prior to administering the injection.  Thanks.

## 2019-05-17 NOTE — Telephone Encounter (Signed)
As the rash appears to be on the forearms, she may continue to receive aeroallergen immunotherapy injections as long as the rash does not progress to where the injections are administered.  Thanks.

## 2019-05-17 NOTE — Telephone Encounter (Signed)
Dr Verlin Fester please evaluate rash Ashleigh sent you pictures to your email

## 2019-05-18 ENCOUNTER — Ambulatory Visit (INDEPENDENT_AMBULATORY_CARE_PROVIDER_SITE_OTHER): Payer: Medicare HMO | Admitting: *Deleted

## 2019-05-18 DIAGNOSIS — J309 Allergic rhinitis, unspecified: Secondary | ICD-10-CM | POA: Diagnosis not present

## 2019-05-18 NOTE — Telephone Encounter (Signed)
Called patient advised as written per Dr Verlin Fester patient verbalized understanding

## 2019-05-18 NOTE — Progress Notes (Signed)
VIALS NOT NEEDED.

## 2019-06-01 ENCOUNTER — Ambulatory Visit (INDEPENDENT_AMBULATORY_CARE_PROVIDER_SITE_OTHER): Payer: Medicare HMO

## 2019-06-01 DIAGNOSIS — J309 Allergic rhinitis, unspecified: Secondary | ICD-10-CM

## 2019-06-13 ENCOUNTER — Ambulatory Visit: Payer: Medicare HMO | Admitting: Allergy and Immunology

## 2019-06-13 ENCOUNTER — Ambulatory Visit (INDEPENDENT_AMBULATORY_CARE_PROVIDER_SITE_OTHER): Payer: Medicare HMO | Admitting: *Deleted

## 2019-06-13 DIAGNOSIS — J309 Allergic rhinitis, unspecified: Secondary | ICD-10-CM

## 2019-07-01 ENCOUNTER — Ambulatory Visit (INDEPENDENT_AMBULATORY_CARE_PROVIDER_SITE_OTHER): Payer: Medicare HMO

## 2019-07-01 DIAGNOSIS — J309 Allergic rhinitis, unspecified: Secondary | ICD-10-CM | POA: Diagnosis not present

## 2019-07-14 ENCOUNTER — Ambulatory Visit (INDEPENDENT_AMBULATORY_CARE_PROVIDER_SITE_OTHER): Payer: Medicare HMO | Admitting: *Deleted

## 2019-07-14 DIAGNOSIS — J309 Allergic rhinitis, unspecified: Secondary | ICD-10-CM

## 2019-07-19 ENCOUNTER — Ambulatory Visit (INDEPENDENT_AMBULATORY_CARE_PROVIDER_SITE_OTHER): Payer: Medicare HMO | Admitting: *Deleted

## 2019-07-19 DIAGNOSIS — J309 Allergic rhinitis, unspecified: Secondary | ICD-10-CM

## 2019-07-26 ENCOUNTER — Ambulatory Visit (INDEPENDENT_AMBULATORY_CARE_PROVIDER_SITE_OTHER): Payer: Medicare HMO | Admitting: *Deleted

## 2019-07-26 DIAGNOSIS — J309 Allergic rhinitis, unspecified: Secondary | ICD-10-CM | POA: Diagnosis not present

## 2019-08-02 ENCOUNTER — Ambulatory Visit (INDEPENDENT_AMBULATORY_CARE_PROVIDER_SITE_OTHER): Payer: Medicare HMO | Admitting: *Deleted

## 2019-08-02 DIAGNOSIS — J309 Allergic rhinitis, unspecified: Secondary | ICD-10-CM | POA: Diagnosis not present

## 2019-08-15 ENCOUNTER — Ambulatory Visit: Payer: Medicare HMO | Admitting: Allergy and Immunology

## 2019-08-18 ENCOUNTER — Ambulatory Visit (INDEPENDENT_AMBULATORY_CARE_PROVIDER_SITE_OTHER): Payer: Medicare HMO

## 2019-08-18 DIAGNOSIS — J309 Allergic rhinitis, unspecified: Secondary | ICD-10-CM

## 2019-08-31 ENCOUNTER — Other Ambulatory Visit: Payer: Self-pay

## 2019-08-31 ENCOUNTER — Emergency Department (HOSPITAL_COMMUNITY)
Admission: EM | Admit: 2019-08-31 | Discharge: 2019-09-01 | Discharge status: Against Medical Advice | Payer: Medicare HMO | Attending: Emergency Medicine | Admitting: Emergency Medicine

## 2019-08-31 ENCOUNTER — Encounter (HOSPITAL_COMMUNITY): Payer: Self-pay | Admitting: Emergency Medicine

## 2019-08-31 DIAGNOSIS — Z5321 Procedure and treatment not carried out due to patient leaving prior to being seen by health care provider: Secondary | ICD-10-CM | POA: Insufficient documentation

## 2019-08-31 DIAGNOSIS — H9311 Tinnitus, right ear: Secondary | ICD-10-CM | POA: Diagnosis present

## 2019-08-31 NOTE — ED Triage Notes (Signed)
Patient reports ringing / pressure at right ear onset Friday last week , denies injury , no hearing loss.

## 2019-09-01 ENCOUNTER — Telehealth: Payer: Self-pay

## 2019-09-01 ENCOUNTER — Emergency Department (HOSPITAL_COMMUNITY)
Admission: EM | Admit: 2019-09-01 | Discharge: 2019-09-01 | Disposition: A | Discharge status: Home / Self Care | Payer: Medicare HMO | Source: Home / Self Care | Attending: Emergency Medicine | Admitting: Emergency Medicine

## 2019-09-01 DIAGNOSIS — G8929 Other chronic pain: Secondary | ICD-10-CM | POA: Insufficient documentation

## 2019-09-01 DIAGNOSIS — I10 Essential (primary) hypertension: Secondary | ICD-10-CM | POA: Insufficient documentation

## 2019-09-01 DIAGNOSIS — Z79899 Other long term (current) drug therapy: Secondary | ICD-10-CM | POA: Insufficient documentation

## 2019-09-01 DIAGNOSIS — H9311 Tinnitus, right ear: Secondary | ICD-10-CM | POA: Insufficient documentation

## 2019-09-01 NOTE — Telephone Encounter (Signed)
LMOM for patient and explained she can continue next week if better.

## 2019-09-01 NOTE — Discharge Instructions (Addendum)
Continue using antihistamines, as well as nasal sprays to help with your symptoms. Follow-up with your primary care provider regarding your visit today.
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
## 2019-09-01 NOTE — ED Notes (Signed)
Patient verbalizes understanding of discharge instructions. Opportunity for questioning and answers were provided. Armband removed by staff, pt discharged from ED. Ambulated out to lobby  

## 2019-09-01 NOTE — Telephone Encounter (Signed)
If she was started on the Amoxicillin for tinnitus and has had no problems with the amoxicillin by next week then she can get her injection next week. Thanks.

## 2019-09-01 NOTE — Telephone Encounter (Signed)
Patient came in trying to get her allergy injections and informed us she was Dx with Tinnitus today and was given a 30 day of Amoxicillin. Wanted to know if she could continue taking her allergy shots next week while being on abx. Last dose given was on 08/18/2019 red vial 0.5 mL. Please advise if she can restart next week.

## 2019-09-01 NOTE — ED Notes (Signed)
Pt did not to wait and left the waiting room.

## 2019-09-01 NOTE — ED Triage Notes (Signed)
Pt to ER for persistent ringing and fullness in her right ear. Was here last night and LWBS due to wait. Shes ambulatory today.

## 2019-09-02 ENCOUNTER — Ambulatory Visit (HOSPITAL_COMMUNITY)
Admission: EM | Admit: 2019-09-02 | Discharge: 2019-09-02 | Disposition: A | Discharge status: Home / Self Care | Payer: Medicare HMO | Attending: Family Medicine | Admitting: Family Medicine

## 2019-09-02 ENCOUNTER — Other Ambulatory Visit: Payer: Self-pay

## 2019-09-02 ENCOUNTER — Encounter (HOSPITAL_COMMUNITY): Payer: Self-pay

## 2019-09-02 DIAGNOSIS — H6981 Other specified disorders of Eustachian tube, right ear: Secondary | ICD-10-CM | POA: Diagnosis not present

## 2019-09-02 MED ORDER — PREDNISONE 50 MG PO TABS: 50.0000 mg | ORAL_TABLET | Freq: Every day | ORAL | 0 refills | Status: AC

## 2019-09-02 NOTE — Discharge Instructions (Signed)
Begin prednisone daily for the next 5 days with food on your stomach Please continue Zyrtec, Flonase and Astelin nasal spray  Please follow-up if developing pain, fever Please follow-up with ENT-contact below if symptoms persisting

## 2019-09-02 NOTE — ED Triage Notes (Signed)
Pt states she started having a ringing in her right ear x week. Pt she is having a fullness sensation in her right ear x 6 days.
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
## 2019-09-05 ENCOUNTER — Ambulatory Visit (INDEPENDENT_AMBULATORY_CARE_PROVIDER_SITE_OTHER): Payer: Medicare HMO | Admitting: Allergy and Immunology

## 2019-09-05 ENCOUNTER — Encounter: Payer: Self-pay | Admitting: Allergy and Immunology

## 2019-09-05 ENCOUNTER — Other Ambulatory Visit: Payer: Self-pay

## 2019-09-05 VITALS — BP 144/80 | HR 86 | Temp 98.7°F | Resp 18 | Ht 62.2 in | Wt 153.0 lb

## 2019-09-05 DIAGNOSIS — H6981 Other specified disorders of Eustachian tube, right ear: Secondary | ICD-10-CM | POA: Insufficient documentation

## 2019-09-05 DIAGNOSIS — J3089 Other allergic rhinitis: Secondary | ICD-10-CM | POA: Diagnosis not present

## 2019-09-05 DIAGNOSIS — J4541 Moderate persistent asthma with (acute) exacerbation: Secondary | ICD-10-CM

## 2019-09-05 MED ORDER — FLUTICASONE PROPIONATE 50 MCG/ACT NA SUSP: 2.0000 | Freq: Every day | NASAL | 5 refills | Status: DC

## 2019-09-05 MED ORDER — MONTELUKAST SODIUM 10 MG PO TABS: 10.0000 mg | ORAL_TABLET | Freq: Every day | ORAL | 5 refills | Status: DC

## 2019-09-05 MED ORDER — AZELASTINE HCL 0.1 % NA SOLN: 2.0000 | Freq: Two times a day (BID) | NASAL | 5 refills | Status: DC

## 2019-09-05 MED ORDER — FLOVENT HFA 220 MCG/ACT IN AERO: 2.0000 | INHALATION_SPRAY | Freq: Two times a day (BID) | RESPIRATORY_TRACT | 5 refills | Status: DC

## 2019-09-05 NOTE — Assessment & Plan Note (Addendum)
Currently with suboptimal control.  Prednisone has been provided (as above).  A prescription has been provided for Flovent (fluticasone) 220 g, 2 inhalations twice a day. To maximize pulmonary deposition, a spacer has been provided along with instructions for its proper administration with an HFA inhaler.  Montelukast 10 mg daily.  Continue albuterol HFA, 1 to 2 inhalations every 4-6 hours if needed.  The patient has been asked to contact me if her symptoms persist or progress. Otherwise, she may return for follow up in 3 months.

## 2019-09-05 NOTE — Patient Instructions (Addendum)
Eustachian tube dysfunction, right Eustachian tube dysfunction versus Mnire's disease.  We will extend the prednisone course and provide a taper.  Prednisone has been provided, 40 mg x3 days, 20 mg x1 day, 10 mg x1 day, then stop.  I have also recommended using fluticasone nasal spray twice daily scheduled along with azelastine nasal spray.  Nasal saline spray (i.e., Simply Saline) or nasal saline lavage (i.e., NeilMed) is recommended as needed and prior to medicated nasal sprays.  For now, decrease dietary sodium intake.  The patient is scheduled for otolaryngology evaluation this Friday.  Moderate persistent asthma Currently with suboptimal control.  Prednisone has been provided (as above).  A prescription has been provided for Flovent (fluticasone) 220 g, 2 inhalations twice a day. To maximize pulmonary deposition, a spacer has been provided along with instructions for its proper administration with an HFA inhaler.  Montelukast 10 mg daily.  Continue albuterol HFA, 1 to 2 inhalations every 4-6 hours if needed.  The patient has been asked to contact me if her symptoms persist or progress. Otherwise, she may return for follow up in 3 months.  Perennial and seasonal allergic rhinitis  Continue appropriate allergen avoidance measures and immunotherapy injections per protocol.  For thick post nasal drainage, add guaifenesin 1200 mg (Mucinex Maximum Strength)  twice daily as needed with adequate hydration as discussed.  Other treatment plan as outlined above for eustachian tube dysfunction.   Return in about 3 months (around 12/06/2019), or if symptoms worsen or fail to improve.

## 2019-09-05 NOTE — Assessment & Plan Note (Addendum)
Eustachian tube dysfunction versus Mnire's disease.  We will extend the prednisone course and provide a taper.  Prednisone has been provided, 40 mg x3 days, 20 mg x1 day, 10 mg x1 day, then stop.  I have also recommended using fluticasone nasal spray twice daily scheduled along with azelastine nasal spray.  Nasal saline spray (i.e., Simply Saline) or nasal saline lavage (i.e., NeilMed) is recommended as needed and prior to medicated nasal sprays.  For now, decrease dietary sodium intake.  The patient is scheduled for otolaryngology evaluation this Friday.
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
## 2019-09-05 NOTE — Assessment & Plan Note (Signed)
   Continue appropriate allergen avoidance measures and immunotherapy injections per protocol.  For thick post nasal drainage, add guaifenesin 1200 mg (Mucinex Maximum Strength)  twice daily as needed with adequate hydration as discussed.  Other treatment plan as outlined above for eustachian tube dysfunction.

## 2019-09-07 NOTE — Telephone Encounter (Signed)
Patient was seen in office 09/05/2019 and advise to continue allergy injections.

## 2019-09-09 DIAGNOSIS — H9121 Sudden idiopathic hearing loss, right ear: Secondary | ICD-10-CM | POA: Insufficient documentation

## 2019-09-09 DIAGNOSIS — K219 Gastro-esophageal reflux disease without esophagitis: Secondary | ICD-10-CM | POA: Insufficient documentation

## 2019-09-09 DIAGNOSIS — H9311 Tinnitus, right ear: Secondary | ICD-10-CM | POA: Insufficient documentation

## 2019-09-15 ENCOUNTER — Other Ambulatory Visit: Payer: Self-pay | Admitting: Otolaryngology

## 2019-09-15 DIAGNOSIS — H9311 Tinnitus, right ear: Secondary | ICD-10-CM

## 2019-09-15 DIAGNOSIS — H9121 Sudden idiopathic hearing loss, right ear: Secondary | ICD-10-CM

## 2019-09-28 ENCOUNTER — Ambulatory Visit (INDEPENDENT_AMBULATORY_CARE_PROVIDER_SITE_OTHER): Payer: Medicare HMO | Admitting: *Deleted

## 2019-09-28 ENCOUNTER — Telehealth: Payer: Self-pay | Admitting: *Deleted

## 2019-09-28 DIAGNOSIS — J309 Allergic rhinitis, unspecified: Secondary | ICD-10-CM | POA: Diagnosis not present

## 2019-09-28 NOTE — Telephone Encounter (Signed)
Patient called today wondering if she can start back on her allergy shots. She states that she has started sneezing again and her allergy symptoms seem worse off of the shots. Please advise.

## 2019-09-28 NOTE — Telephone Encounter (Signed)
Patient called and message was relayed.

## 2019-09-28 NOTE — Telephone Encounter (Signed)
Yes, she can start back on injections. When was her last injection and what dose was she on? Had she had problems with local or systemic reactions? Thanks.

## 2019-09-28 NOTE — Telephone Encounter (Signed)
Noted in pts flowsheet 

## 2019-09-28 NOTE — Telephone Encounter (Signed)
Start back at 0.05 of Red and build up from there. Thanks

## 2019-09-28 NOTE — Telephone Encounter (Signed)
Pt is on red vial [id number] and other is molds red 1:100 build up weekly then q 2 weeks at 0.5. last injection was 08-18-2019

## 2019-10-05 ENCOUNTER — Ambulatory Visit (INDEPENDENT_AMBULATORY_CARE_PROVIDER_SITE_OTHER): Payer: Medicare HMO

## 2019-10-05 DIAGNOSIS — J309 Allergic rhinitis, unspecified: Secondary | ICD-10-CM | POA: Diagnosis not present

## 2019-10-10 ENCOUNTER — Ambulatory Visit
Admission: RE | Admit: 2019-10-10 | Discharge: 2019-10-10 | Disposition: A | Discharge status: Home / Self Care | Payer: Medicare HMO | Source: Ambulatory Visit | Attending: Otolaryngology | Admitting: Otolaryngology

## 2019-10-10 DIAGNOSIS — H9121 Sudden idiopathic hearing loss, right ear: Secondary | ICD-10-CM

## 2019-10-10 DIAGNOSIS — H9311 Tinnitus, right ear: Secondary | ICD-10-CM

## 2019-10-10 MED ORDER — GADOBENATE DIMEGLUMINE 529 MG/ML IV SOLN
14.0000 mL | Freq: Once | INTRAVENOUS | Status: AC | PRN
  Administered 2019-10-10: 14 mL via INTRAVENOUS

## 2019-10-13 ENCOUNTER — Ambulatory Visit (INDEPENDENT_AMBULATORY_CARE_PROVIDER_SITE_OTHER): Payer: Medicare HMO

## 2019-10-13 DIAGNOSIS — J309 Allergic rhinitis, unspecified: Secondary | ICD-10-CM | POA: Diagnosis not present

## 2019-10-17 DIAGNOSIS — J3089 Other allergic rhinitis: Secondary | ICD-10-CM

## 2019-10-17 NOTE — Progress Notes (Signed)
VIALS EXP 10-16-20

## 2019-10-18 DIAGNOSIS — J301 Allergic rhinitis due to pollen: Secondary | ICD-10-CM

## 2019-10-20 ENCOUNTER — Ambulatory Visit (INDEPENDENT_AMBULATORY_CARE_PROVIDER_SITE_OTHER): Payer: Medicare HMO

## 2019-10-20 DIAGNOSIS — J309 Allergic rhinitis, unspecified: Secondary | ICD-10-CM

## 2019-10-25 ENCOUNTER — Ambulatory Visit (INDEPENDENT_AMBULATORY_CARE_PROVIDER_SITE_OTHER): Payer: Medicare HMO

## 2019-10-25 DIAGNOSIS — J309 Allergic rhinitis, unspecified: Secondary | ICD-10-CM | POA: Diagnosis not present

## 2019-11-01 ENCOUNTER — Ambulatory Visit (INDEPENDENT_AMBULATORY_CARE_PROVIDER_SITE_OTHER): Payer: Medicare HMO

## 2019-11-01 DIAGNOSIS — J309 Allergic rhinitis, unspecified: Secondary | ICD-10-CM

## 2019-11-15 ENCOUNTER — Ambulatory Visit (INDEPENDENT_AMBULATORY_CARE_PROVIDER_SITE_OTHER): Payer: Medicare HMO

## 2019-11-15 DIAGNOSIS — J309 Allergic rhinitis, unspecified: Secondary | ICD-10-CM | POA: Diagnosis not present

## 2019-11-22 ENCOUNTER — Ambulatory Visit (INDEPENDENT_AMBULATORY_CARE_PROVIDER_SITE_OTHER): Payer: Medicare HMO

## 2019-11-22 DIAGNOSIS — J309 Allergic rhinitis, unspecified: Secondary | ICD-10-CM | POA: Diagnosis not present

## 2019-11-30 ENCOUNTER — Ambulatory Visit (INDEPENDENT_AMBULATORY_CARE_PROVIDER_SITE_OTHER): Payer: Medicare HMO | Admitting: *Deleted

## 2019-11-30 DIAGNOSIS — J309 Allergic rhinitis, unspecified: Secondary | ICD-10-CM | POA: Diagnosis not present

## 2019-12-05 ENCOUNTER — Ambulatory Visit: Payer: Medicare HMO | Admitting: Allergy and Immunology

## 2019-12-07 ENCOUNTER — Telehealth: Payer: Self-pay

## 2019-12-07 ENCOUNTER — Other Ambulatory Visit: Payer: Self-pay | Admitting: *Deleted

## 2019-12-07 MED ORDER — TRIAMCINOLONE ACETONIDE 0.1 % EX OINT: 1.0000 "application " | TOPICAL_OINTMENT | Freq: Two times a day (BID) | CUTANEOUS | 1 refills | Status: DC | PRN

## 2019-12-07 NOTE — Telephone Encounter (Signed)
Patient called back and was informed of Dr. Mariane Masters recommendation. Patient verbalized understanding and will call back if it doesn't improve.

## 2019-12-07 NOTE — Telephone Encounter (Signed)
Prescription has been sent in. Called patient and left a voicemail asking to return call to inform.

## 2019-12-07 NOTE — Telephone Encounter (Signed)
Patient came in to get her allergy injections. Patient informed me that she has a rash on both arms and has been there since Sunday. I recommended patient wait to get her allergy injection next week. Patient is wondering what she can use to help. Patient ask that I take a picture and sent it. I can send them via telephone. Please advise.

## 2019-12-07 NOTE — Telephone Encounter (Signed)
Thank you for taking/sending the picture.  I would recommend triamcinolone 0.1% ointment sparingly to affected areas twice daily as needed and antihistamines daily (Allegra, Xyzal, or Zyrtec). I agree with the decision to have deferred the injections until a later date. If the rash does not improve, have her schedule follow-up visit. Thanks.

## 2019-12-09 ENCOUNTER — Other Ambulatory Visit: Payer: Self-pay

## 2019-12-09 ENCOUNTER — Emergency Department (HOSPITAL_COMMUNITY): Payer: Medicare HMO

## 2019-12-09 ENCOUNTER — Emergency Department (HOSPITAL_COMMUNITY)
Admission: EM | Admit: 2019-12-09 | Discharge: 2019-12-09 | Disposition: A | Discharge status: Home / Self Care | Payer: Medicare HMO | Attending: Emergency Medicine | Admitting: Emergency Medicine

## 2019-12-09 DIAGNOSIS — M545 Low back pain: Secondary | ICD-10-CM | POA: Diagnosis present

## 2019-12-09 DIAGNOSIS — Z79899 Other long term (current) drug therapy: Secondary | ICD-10-CM | POA: Insufficient documentation

## 2019-12-09 DIAGNOSIS — J45909 Unspecified asthma, uncomplicated: Secondary | ICD-10-CM | POA: Insufficient documentation

## 2019-12-09 DIAGNOSIS — M5136 Other intervertebral disc degeneration, lumbar region: Secondary | ICD-10-CM | POA: Diagnosis not present

## 2019-12-09 DIAGNOSIS — M5441 Lumbago with sciatica, right side: Secondary | ICD-10-CM | POA: Diagnosis not present

## 2019-12-09 DIAGNOSIS — I10 Essential (primary) hypertension: Secondary | ICD-10-CM | POA: Diagnosis not present

## 2019-12-09 DIAGNOSIS — Z9181 History of falling: Secondary | ICD-10-CM | POA: Insufficient documentation

## 2019-12-09 LAB — URINALYSIS, ROUTINE W REFLEX MICROSCOPIC
Bilirubin Urine: NEGATIVE
Glucose, UA: NEGATIVE mg/dL
Hgb urine dipstick: NEGATIVE
Ketones, ur: NEGATIVE mg/dL
Leukocytes,Ua: NEGATIVE
Nitrite: NEGATIVE
Protein, ur: NEGATIVE mg/dL
Specific Gravity, Urine: 1.019 (ref 1.005–1.030)
pH: 6 (ref 5.0–8.0)

## 2019-12-09 LAB — COMPREHENSIVE METABOLIC PANEL
ALT: 20 U/L (ref 0–44)
AST: 17 U/L (ref 15–41)
Albumin: 4.4 g/dL (ref 3.5–5.0)
Alkaline Phosphatase: 132 U/L — ABNORMAL HIGH (ref 38–126)
Anion gap: 12 (ref 5–15)
BUN: 16 mg/dL (ref 6–20)
CO2: 23 mmol/L (ref 22–32)
Calcium: 9.1 mg/dL (ref 8.9–10.3)
Chloride: 103 mmol/L (ref 98–111)
Creatinine, Ser: 0.68 mg/dL (ref 0.44–1.00)
GFR calc Af Amer: >60 mL/min (ref >60)
GFR calc non Af Amer: >60 mL/min (ref >60)
Glucose, Bld: 116 mg/dL — ABNORMAL HIGH (ref 70–99)
Potassium: 3.8 mmol/L (ref 3.5–5.1)
Sodium: 138 mmol/L (ref 135–145)
Total Bilirubin: 0.4 mg/dL (ref 0.3–1.2)
Total Protein: 7.9 g/dL (ref 6.5–8.1)

## 2019-12-09 LAB — CBC WITH DIFFERENTIAL/PLATELET
Abs Immature Granulocytes: 0.05 10*3/uL (ref 0.00–0.07)
Basophils Absolute: 0.1 10*3/uL (ref 0.0–0.1)
Basophils Relative: 1 %
Eosinophils Absolute: 0.1 10*3/uL (ref 0.0–0.5)
Eosinophils Relative: 1 %
HCT: 32.7 % — ABNORMAL LOW (ref 36.0–46.0)
Hemoglobin: 9.3 g/dL — ABNORMAL LOW (ref 12.0–15.0)
Immature Granulocytes: 0 %
Lymphocytes Relative: 14 %
Lymphs Abs: 1.9 10*3/uL (ref 0.7–4.0)
MCH: 19.9 pg — ABNORMAL LOW (ref 26.0–34.0)
MCHC: 28.4 g/dL — ABNORMAL LOW (ref 30.0–36.0)
MCV: 70 fL — ABNORMAL LOW (ref 80.0–100.0)
Monocytes Absolute: 1 10*3/uL (ref 0.1–1.0)
Monocytes Relative: 8 %
Neutro Abs: 9.9 10*3/uL — ABNORMAL HIGH (ref 1.7–7.7)
Neutrophils Relative %: 76 %
Platelets: 418 10*3/uL — ABNORMAL HIGH (ref 150–400)
RBC: 4.67 MIL/uL (ref 3.87–5.11)
RDW: 16.9 % — ABNORMAL HIGH (ref 11.5–15.5)
WBC: 13 10*3/uL — ABNORMAL HIGH (ref 4.0–10.5)
nRBC: 0 % (ref 0.0–0.2)

## 2019-12-09 MED ORDER — CYCLOBENZAPRINE HCL 10 MG PO TABS: 10.0000 mg | ORAL_TABLET | Freq: Two times a day (BID) | ORAL | 0 refills | Status: DC | PRN

## 2019-12-09 MED ORDER — MORPHINE SULFATE (PF) 4 MG/ML IV SOLN
4.0000 mg | Freq: Once | INTRAVENOUS | Status: AC
  Administered 2019-12-09: 4 mg via INTRAVENOUS
  Filled 2019-12-09: qty 1

## 2019-12-09 MED ORDER — LIDOCAINE 5 % EX PTCH: 1.0000 | MEDICATED_PATCH | CUTANEOUS | 0 refills | Status: DC

## 2019-12-09 NOTE — Discharge Instructions (Signed)
Your work-up today was consistent with the worsened degenerative disease in your back causing your symptoms today.  We felt muscle spasms and tenderness on exam thus, we are recommending using the muscle relaxant and the numbing patch to help with your discomfort.  You may also use anti-inflammatory medication such as Motrin and Tylenol to help with the discomfort.  Please rest and stay hydrated and be careful not to fall again.  Please follow-up with your back doctor or the neurosurgery team.  Please follow-up with your PCP as well.  If any symptoms change or worsen, please return to the nearest emergency department.
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
## 2019-12-09 NOTE — ED Triage Notes (Addendum)
Patient states for last couple of months she has not had good use of her legs, says she has pain in bilateral legs. Patient states pain started traveling toward right hip. Patient says pain is now in her right flank and feels like a kidney stone. Patient says she has tried to get a hold of her pcp but pcp is refusing to return her call. Patient is able to walk at baseline and walked to the car this morning. Pain rated 10/10. Patient states she has had multiple falls in last few months. Patient states she was in [facility] in early 1990's and since then her right side has always been weak.

## 2019-12-10 LAB — URINE CULTURE: Culture: NO GROWTH

## 2019-12-12 ENCOUNTER — Telehealth: Payer: Self-pay | Admitting: Specialist

## 2019-12-12 NOTE — Telephone Encounter (Signed)
Patient called requesting an appointment to see Dr Louanne Skye. Pt was seen in ED on 12/09/19 for DDD. Pt had previous appointment scheduled to see Dr Louanne Skye on (06/25/2017 & 04/26/2018) which she has no showed. Can we schedule an New Patient appointment?

## 2019-12-12 NOTE — Telephone Encounter (Signed)
Yes ma'am! 

## 2019-12-12 NOTE — Telephone Encounter (Signed)
As long as she keeps the appt. If she no shows again he may decide she needs to go to someone else as we are booked out so far for NP appts.

## 2019-12-12 NOTE — Telephone Encounter (Signed)
Ok so am I just putting her in the next available (New Patient Back) slot?

## 2019-12-13 NOTE — Telephone Encounter (Signed)
I called patient to schedule an appointment, she will  Call back if she decide  To make an appointment

## 2019-12-20 ENCOUNTER — Ambulatory Visit (INDEPENDENT_AMBULATORY_CARE_PROVIDER_SITE_OTHER): Payer: Medicare HMO

## 2019-12-20 DIAGNOSIS — J309 Allergic rhinitis, unspecified: Secondary | ICD-10-CM

## 2019-12-29 ENCOUNTER — Ambulatory Visit (INDEPENDENT_AMBULATORY_CARE_PROVIDER_SITE_OTHER): Payer: Medicare HMO

## 2019-12-29 DIAGNOSIS — J309 Allergic rhinitis, unspecified: Secondary | ICD-10-CM | POA: Diagnosis not present

## 2020-01-03 ENCOUNTER — Ambulatory Visit (INDEPENDENT_AMBULATORY_CARE_PROVIDER_SITE_OTHER): Payer: Medicare HMO

## 2020-01-03 DIAGNOSIS — J309 Allergic rhinitis, unspecified: Secondary | ICD-10-CM

## 2020-01-05 ENCOUNTER — Inpatient Hospital Stay: Admit: 2020-01-05 | Discharge: 2020-01-05 | Payer: 344 | Primary: Family Medicine

## 2020-01-05 DIAGNOSIS — Z20828 Contact with and (suspected) exposure to other viral communicable diseases: Secondary | ICD-10-CM

## 2020-01-05 DIAGNOSIS — U071 COVID-19: Secondary | ICD-10-CM

## 2020-01-06 LAB — SARS COV-2 (COVID-19) RNA: BKR SARS-COV-2 RNA (COVID-19) (YH): POSITIVE — AB

## 2020-01-06 NOTE — Other
Outreach call to [facility] to inform of COVID-19 test results. Result is POSITIVE. Marissa Roth reports feeling cough runny nose and head ache. Advised to stay home, self isolate for 10 days from the first date of symptoms AND no fever x 24 hours or for 10 days from date of positive test if there are no symptoms and symptoms have improved. Maintain at least 6 feet away from others, wear a mask, observe symptoms, handwashing x 20 seconds, and disinfect commonly used surfaces. Instructed to contact PCP or [place] [facility] at [facility] [phone]) for any changes in condition. Talked about the possibility for the test to be positive up to 90 days and not recommended to get re-tested.

## 2020-01-06 NOTE — Other
Outreach call to [place] to inform of covid test result. Left message on voicemail to please contact care coordinator. Callback number left [phone] phone number is disconnected

## 2020-01-09 ENCOUNTER — Other Ambulatory Visit: Payer: Self-pay

## 2020-01-09 ENCOUNTER — Ambulatory Visit (INDEPENDENT_AMBULATORY_CARE_PROVIDER_SITE_OTHER): Payer: Medicare HMO | Admitting: Allergy and Immunology

## 2020-01-09 ENCOUNTER — Encounter: Payer: Self-pay | Admitting: Allergy and Immunology

## 2020-01-09 VITALS — BP 146/82 | HR 84 | Temp 98.0°F | Resp 20 | Ht 61.5 in

## 2020-01-09 DIAGNOSIS — K219 Gastro-esophageal reflux disease without esophagitis: Secondary | ICD-10-CM | POA: Diagnosis not present

## 2020-01-09 DIAGNOSIS — J3089 Other allergic rhinitis: Secondary | ICD-10-CM | POA: Diagnosis not present

## 2020-01-09 DIAGNOSIS — J4540 Moderate persistent asthma, uncomplicated: Secondary | ICD-10-CM | POA: Diagnosis not present

## 2020-01-09 MED ORDER — BUDESONIDE-FORMOTEROL FUMARATE 160-4.5 MCG/ACT IN AERO: 2.0000 | INHALATION_SPRAY | Freq: Two times a day (BID) | RESPIRATORY_TRACT | 5 refills | Status: DC

## 2020-01-09 NOTE — Assessment & Plan Note (Signed)
   Continue appropriate reflux lifestyle modifications and omeprazole as prescribed and famotidine (Pepcid) 20 mg twice daily if needed.
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
## 2020-01-09 NOTE — Patient Instructions (Addendum)
Moderate persistent asthma Currently with suboptimal control. A prescription has been provided for Symbicort (budesonide/formoterol) 160/4.5 g, 2 inhalations via spacer device twice a day.  Continue albuterol HFA, 1 to 2 inhalations every 4-6 hours if needed.  The patient has been asked to contact me if her symptoms persist or progress. Otherwise, she may return for follow up in 3 months.  Perennial and seasonal allergic rhinitis  Continue appropriate allergen avoidance measures and immunotherapy injections per protocol.  For thick post nasal drainage, add guaifenesin 1200 mg (Mucinex Maximum Strength)  twice daily as needed with adequate hydration as discussed.  GERD (gastroesophageal reflux disease)  Continue appropriate reflux lifestyle modifications and omeprazole as prescribed and famotidine (Pepcid) 20 mg twice daily if needed.   Return in about 3 months (around 04/10/2020), or if symptoms worsen or fail to improve.

## 2020-01-09 NOTE — Assessment & Plan Note (Signed)
Currently with suboptimal control. A prescription has been provided for Symbicort (budesonide/formoterol) 160/4.5 g, 2 inhalations via spacer device twice a day.  Continue albuterol HFA, 1 to 2 inhalations every 4-6 hours if needed.  The patient has been asked to contact me if her symptoms persist or progress. Otherwise, she may return for follow up in 3 months.

## 2020-01-09 NOTE — Assessment & Plan Note (Signed)
   Continue appropriate allergen avoidance measures and immunotherapy injections per protocol.  For thick post nasal drainage, add guaifenesin 1200 mg (Mucinex Maximum Strength)  twice daily as needed with adequate hydration as discussed.

## 2020-01-10 ENCOUNTER — Other Ambulatory Visit: Payer: Self-pay | Admitting: Allergy and Immunology

## 2020-01-10 ENCOUNTER — Ambulatory Visit (INDEPENDENT_AMBULATORY_CARE_PROVIDER_SITE_OTHER): Payer: Medicare HMO

## 2020-01-10 DIAGNOSIS — J309 Allergic rhinitis, unspecified: Secondary | ICD-10-CM | POA: Diagnosis not present

## 2020-01-10 MED ORDER — BUDESONIDE-FORMOTEROL FUMARATE 160-4.5 MCG/ACT IN AERO: 2.0000 | INHALATION_SPRAY | Freq: Two times a day (BID) | RESPIRATORY_TRACT | 5 refills | Status: DC

## 2020-01-11 ENCOUNTER — Other Ambulatory Visit: Payer: Self-pay

## 2020-01-11 ENCOUNTER — Ambulatory Visit: Payer: Self-pay

## 2020-01-11 ENCOUNTER — Ambulatory Visit: Payer: Medicare HMO | Admitting: Specialist

## 2020-01-11 ENCOUNTER — Encounter: Payer: Self-pay | Admitting: Specialist

## 2020-01-11 VITALS — BP 151/84 | HR 87 | Ht 61.5 in | Wt 153.0 lb

## 2020-01-11 DIAGNOSIS — M5136 Other intervertebral disc degeneration, lumbar region: Secondary | ICD-10-CM

## 2020-01-11 DIAGNOSIS — M4005 Postural kyphosis, thoracolumbar region: Secondary | ICD-10-CM | POA: Diagnosis not present

## 2020-01-11 DIAGNOSIS — M545 Low back pain, unspecified: Secondary | ICD-10-CM

## 2020-01-11 DIAGNOSIS — M47816 Spondylosis without myelopathy or radiculopathy, lumbar region: Secondary | ICD-10-CM

## 2020-01-11 DIAGNOSIS — M1611 Unilateral primary osteoarthritis, right hip: Secondary | ICD-10-CM

## 2020-01-11 DIAGNOSIS — M51369 Other intervertebral disc degeneration, lumbar region without mention of lumbar back pain or lower extremity pain: Secondary | ICD-10-CM

## 2020-01-11 DIAGNOSIS — Z981 Arthrodesis status: Secondary | ICD-10-CM | POA: Diagnosis not present

## 2020-01-11 MED ORDER — MELOXICAM 15 MG PO TABS: 15.0000 mg | ORAL_TABLET | Freq: Every day | ORAL | 2 refills | Status: DC
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
## 2020-01-11 NOTE — Addendum Note (Signed)
Addended by: Basil Dess on: 01/11/2020 01:29 PM   Modules accepted: Orders

## 2020-01-11 NOTE — Patient Instructions (Addendum)
  Knee is suffering from osteoarthritis, only real proven treatments are Weight loss, NSIADs like diclofenac and exercise. Well padded shoes help. Ice the knee 2-3 times a day 15-20 mins at a time. Mobic 15 mg po daily.  Hemp CBD capsules, amazon.com 5,000-7,000 mg per bottle, 60 capsules per bottle, take one capsule twice a day. Cane in the left hand to use with right leg weight bearing. Follow-Up Instructions: No follow-ups on file.  Referral for injection of the L5-S1 facet right side for severe inflamation and arthritis changes. Referral for evaluation of the right hip for possible right hip replacement. Avoid bending, stooping and avoid lifting weights greater than 10 lbs. Avoid prolong standing and walking. Avoid frequent bending and stooping  No lifting greater than 10 lbs. May use ice or moist heat for pain. Weight loss is of benefit. Handicap license is approved.

## 2020-01-14 ENCOUNTER — Encounter (HOSPITAL_COMMUNITY): Payer: Self-pay | Admitting: Gynecology

## 2020-01-14 ENCOUNTER — Other Ambulatory Visit: Payer: Self-pay

## 2020-01-14 ENCOUNTER — Telehealth: Payer: Self-pay | Admitting: Surgical

## 2020-01-14 ENCOUNTER — Ambulatory Visit (HOSPITAL_COMMUNITY)
Admission: EM | Admit: 2020-01-14 | Discharge: 2020-01-14 | Disposition: A | Discharge status: Home / Self Care | Payer: Medicare HMO

## 2020-01-14 DIAGNOSIS — M7989 Other specified soft tissue disorders: Secondary | ICD-10-CM | POA: Diagnosis not present
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
## 2020-01-14 NOTE — Telephone Encounter (Signed)
Called and spoke with patient.  She is concerned with unilateral RLE swelling with onset overnight.  She notes it is difficult to put a shoe on due to swelling. Her calf is not tender to palpation. She cannot send a picture for reference due to her phone function.  Denies fevers/chills.  Will take Mobic and elevate leg.  If no improvement by this afternoon/evening, she will go to UC/ED to r/o DVT.

## 2020-01-14 NOTE — ED Triage Notes (Signed)
Per patient was deen by her orthopedic x 3 days ago. Patient stated was give rx Meloxicam 15 mg. Pt stated with right leg pain / burning and swelling.

## 2020-01-17 ENCOUNTER — Telehealth: Payer: Self-pay | Admitting: Specialist

## 2020-01-17 ENCOUNTER — Ambulatory Visit (INDEPENDENT_AMBULATORY_CARE_PROVIDER_SITE_OTHER): Payer: Medicare HMO

## 2020-01-17 DIAGNOSIS — J309 Allergic rhinitis, unspecified: Secondary | ICD-10-CM | POA: Diagnosis not present

## 2020-01-17 NOTE — Telephone Encounter (Signed)
Pt called stating the meloxicam Dr. Louanne Skye prescribed at her last appt on 01-11-20 caused her feet and legs to swell up so now she is concerned about continuing to take the medicine. Pt would like a call back with some advice.  [phone]

## 2020-01-17 NOTE — Telephone Encounter (Signed)
Stop the meloxicam, it is causing fluid retention, if she is able to take advil, she can try it rather than the meloxicam.

## 2020-01-17 NOTE — Telephone Encounter (Signed)
Patient called in concerned about the meloxicam and it making her legs and feet swell. I called and lmom that she should stop the medication-----Please advise further if there is something else that we can give her instead.

## 2020-01-17 NOTE — Telephone Encounter (Signed)
I called and lmom that if she was able to take advil that she could try that and she could take 4 - 200 mg tablets up to 3 times a day.  If she has further questions she can call me back

## 2020-01-18 ENCOUNTER — Ambulatory Visit: Payer: Medicare HMO | Admitting: Orthopaedic Surgery

## 2020-01-18 ENCOUNTER — Encounter: Payer: Self-pay | Admitting: Orthopaedic Surgery

## 2020-01-18 ENCOUNTER — Other Ambulatory Visit: Payer: Self-pay

## 2020-01-18 VITALS — Ht 64.0 in | Wt 130.0 lb

## 2020-01-18 DIAGNOSIS — M1611 Unilateral primary osteoarthritis, right hip: Secondary | ICD-10-CM

## 2020-01-18 DIAGNOSIS — M25551 Pain in right hip: Secondary | ICD-10-CM
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
## 2020-01-23 ENCOUNTER — Ambulatory Visit: Payer: Medicare HMO | Admitting: Family Medicine

## 2020-01-25 ENCOUNTER — Encounter: Payer: Self-pay | Admitting: Family Medicine

## 2020-01-25 ENCOUNTER — Ambulatory Visit (INDEPENDENT_AMBULATORY_CARE_PROVIDER_SITE_OTHER): Payer: Medicare HMO | Admitting: Family Medicine

## 2020-01-25 ENCOUNTER — Other Ambulatory Visit: Payer: Self-pay

## 2020-01-25 ENCOUNTER — Ambulatory Visit: Payer: Self-pay

## 2020-01-25 DIAGNOSIS — M1611 Unilateral primary osteoarthritis, right hip: Secondary | ICD-10-CM | POA: Diagnosis not present

## 2020-01-25 MED ORDER — HYDROCODONE-ACETAMINOPHEN 5-325 MG PO TABS: 1.0000 | ORAL_TABLET | Freq: Four times a day (QID) | ORAL | 0 refills | Status: DC | PRN

## 2020-01-25 NOTE — Progress Notes (Signed)
Subjective: Patient is here for ultrasound-guided intra-articular right hip injection.   Groin pain, significant DJD.  Objective:  Unable to fully extend hip, lots of pain with IR.  Procedure: Ultrasound-guided right hip injection: After sterile prep with Betadine, injected 8 cc 1% lidocaine without epinephrine and 40 mg methylprednisolone using a 22-gauge spinal needle, passing the needle through the iliofemoral ligament into the femoral head/neck junction.  Injectate seen filling joint capsule.  Return in one month with Dr. Ninfa Linden.

## 2020-01-31 ENCOUNTER — Ambulatory Visit (INDEPENDENT_AMBULATORY_CARE_PROVIDER_SITE_OTHER): Payer: Medicare HMO

## 2020-01-31 DIAGNOSIS — J3089 Other allergic rhinitis: Secondary | ICD-10-CM

## 2020-02-01 ENCOUNTER — Telehealth: Payer: Self-pay | Admitting: Orthopaedic Surgery

## 2020-02-01 NOTE — Telephone Encounter (Signed)
Pt called in stating she discussed an injection in her lower back with Dr. Ninfa Linden on 01/16/20 and never received a call to follow up. Pt states she got a letter from her insurance company telling her she was approved and the approval timeline was from 01/16/20-02/15/20.  [phone]

## 2020-02-01 NOTE — Telephone Encounter (Signed)
Called pt and lvm #1 to get her scheduled for an esi. Per Dr. Ernestina Patches pt needs to finish both dosages and be scheduled 2 weeks after 2nd dose.  Thanks.

## 2020-02-01 NOTE — Telephone Encounter (Signed)
Patient left message to follow up with lower back injection. Please advise.  I see in referral note, she was scheduled for hip inj on 3/22 then the COVID vaccine on 4/12. Has patient been contacted?  Thanks!

## 2020-02-05 ENCOUNTER — Other Ambulatory Visit: Payer: Self-pay | Admitting: Allergy and Immunology

## 2020-02-09 ENCOUNTER — Other Ambulatory Visit: Payer: Self-pay | Admitting: Allergy and Immunology

## 2020-02-13 ENCOUNTER — Ambulatory Visit: Payer: Medicare HMO

## 2020-02-15 ENCOUNTER — Telehealth: Payer: Self-pay | Admitting: Family Medicine

## 2020-02-15 ENCOUNTER — Other Ambulatory Visit: Payer: Self-pay

## 2020-02-15 ENCOUNTER — Ambulatory Visit: Payer: Medicare HMO | Admitting: Orthopaedic Surgery

## 2020-02-15 ENCOUNTER — Ambulatory Visit (HOSPITAL_COMMUNITY)
Admission: EM | Admit: 2020-02-15 | Discharge: 2020-02-15 | Disposition: A | Discharge status: Home / Self Care | Payer: Medicare HMO

## 2020-02-15 DIAGNOSIS — M5431 Sciatica, right side: Secondary | ICD-10-CM | POA: Diagnosis not present

## 2020-02-15 DIAGNOSIS — M25551 Pain in right hip: Secondary | ICD-10-CM | POA: Diagnosis not present

## 2020-02-15 MED ORDER — TIZANIDINE HCL 4 MG PO TABS: 4.0000 mg | ORAL_TABLET | Freq: Four times a day (QID) | ORAL | 1 refills | Status: DC | PRN

## 2020-02-15 MED ORDER — HYDROCODONE-ACETAMINOPHEN 5-325 MG PO TABS: 1.0000 | ORAL_TABLET | Freq: Four times a day (QID) | ORAL | 0 refills | Status: DC | PRN

## 2020-02-15 MED ORDER — METHYLPREDNISOLONE SODIUM SUCC 125 MG IJ SOLR
INTRAMUSCULAR | Status: AC
  Filled 2020-02-15: qty 2

## 2020-02-15 MED ORDER — KETOROLAC TROMETHAMINE 60 MG/2ML IM SOLN
INTRAMUSCULAR | Status: AC
  Filled 2020-02-15: qty 2

## 2020-02-15 MED ORDER — KETOROLAC TROMETHAMINE 60 MG/2ML IM SOLN
60.0000 mg | Freq: Once | INTRAMUSCULAR | Status: AC
  Administered 2020-02-15: 60 mg via INTRAMUSCULAR

## 2020-02-15 MED ORDER — METHYLPREDNISOLONE SODIUM SUCC 125 MG IJ SOLR
80.0000 mg | Freq: Once | INTRAMUSCULAR | Status: AC
  Administered 2020-02-15: 20:00:00 80 mg via INTRAMUSCULAR

## 2020-02-15 NOTE — Telephone Encounter (Signed)
Sent!

## 2020-02-15 NOTE — ED Triage Notes (Signed)
Pt c/o right buttocks, hip, leg pain approx 4-5 months ago that was tx with muscle relaxants. Pt reports bilat leg swelling after taking muscle relaxants. Pt quit taking muscle relaxants at PCP recommendations and leg swelling continued. Pt states she has had local "injection" into the hip for pain.  Pt stated while lifting boxes last night the right buttocks, hip leg pain occurred again.

## 2020-02-15 NOTE — Telephone Encounter (Signed)
Please advise 

## 2020-02-15 NOTE — Telephone Encounter (Signed)
Refill Rx Hydrocodone   Pharmacy: [facility] @ [address]

## 2020-02-15 NOTE — Telephone Encounter (Signed)
I called and advised the patient this has been refilled.
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
## 2020-02-15 NOTE — Discharge Instructions (Addendum)
You received a Solu-Medrol and Toradol injection today for pain and inflammation.  I have also prescribed  tizanidine which is a muscle relaxer and works really well for her sciatic pain which is causing your low back and hip pain.  Keep follow-up with your orthopedic specialist.

## 2020-02-16 ENCOUNTER — Ambulatory Visit (INDEPENDENT_AMBULATORY_CARE_PROVIDER_SITE_OTHER): Payer: Medicare HMO

## 2020-02-16 DIAGNOSIS — J309 Allergic rhinitis, unspecified: Secondary | ICD-10-CM | POA: Diagnosis not present

## 2020-02-18 ENCOUNTER — Encounter: Admit: 2020-02-18 | Payer: PRIVATE HEALTH INSURANCE | Primary: Family Medicine

## 2020-02-19 ENCOUNTER — Ambulatory Visit: Admit: 2020-02-19 | Payer: PRIVATE HEALTH INSURANCE | Primary: Family Medicine

## 2020-02-19 DIAGNOSIS — Z23 Encounter for immunization: Secondary | ICD-10-CM

## 2020-02-23 ENCOUNTER — Other Ambulatory Visit: Payer: Self-pay | Admitting: *Deleted

## 2020-02-23 MED ORDER — LEVOCETIRIZINE DIHYDROCHLORIDE 5 MG PO TABS: 5.0000 mg | ORAL_TABLET | Freq: Every evening | ORAL | 5 refills | Status: DC

## 2020-02-27 ENCOUNTER — Other Ambulatory Visit: Payer: Self-pay

## 2020-02-27 ENCOUNTER — Ambulatory Visit: Payer: Medicare HMO | Admitting: Orthopaedic Surgery

## 2020-02-27 ENCOUNTER — Ambulatory Visit (INDEPENDENT_AMBULATORY_CARE_PROVIDER_SITE_OTHER): Payer: Medicare HMO

## 2020-02-27 ENCOUNTER — Encounter: Payer: Self-pay | Admitting: Orthopaedic Surgery

## 2020-02-27 DIAGNOSIS — M25551 Pain in right hip: Secondary | ICD-10-CM

## 2020-02-27 DIAGNOSIS — J309 Allergic rhinitis, unspecified: Secondary | ICD-10-CM | POA: Diagnosis not present

## 2020-02-27 DIAGNOSIS — M1611 Unilateral primary osteoarthritis, right hip: Secondary | ICD-10-CM | POA: Diagnosis not present

## 2020-02-27 MED ORDER — HYDROCODONE-ACETAMINOPHEN 5-325 MG PO TABS: 1.0000 | ORAL_TABLET | Freq: Four times a day (QID) | ORAL | 0 refills | Status: DC | PRN

## 2020-02-27 NOTE — Progress Notes (Signed)
The patient comes in today for continued pain as it relates to her right hip.  She does have known osteoarthritis of the right hip and I feel is in need of hip replacement surgery at some point based on her radiographic findings and clinical exam findings.  She did have an intra-articular injection and that right hip recently.  This was by Dr. Junius Roads.  She says that her pain went away for about 2 weeks.  She then was moving and felt like she injured that hip again.  She went to urgent care and did receive some type of injection but not in the hip joint itself.  She is requesting hydrocodone today.  We have provided that once in the past.  I counseled her about narcotics use and recommended anti-inflammatories and I will provide that for her with a least 1 more time.  We talked about joint replacement surgery.  She is uncertain about proceeding with surgery at this point as she is moving.  With that being said, I would like to see her back in 4 weeks to see how she is doing overall and she would like to keep that appointment.  She did let me know she is scheduled for an intervention her lumbar spine in terms of an injection.  Given the fact that her steroid injection helped somewhat in the hip joint and all of her symptoms resolved for 2 weeks I feel that holding off on the spine injection would be appropriate.  On examination of her right hip she still has significant pain with internal and external rotation on the right hip in the groin.  There is no pain on the left hip.  This point we will see her back in 4 weeks to see how she is doing overall and I did send in hydrocodone for her for 1 more time.  All question concerns were answered and addressed.  No x-rays are needed at her next visit.

## 2020-02-28 ENCOUNTER — Encounter: Payer: Medicare HMO | Admitting: Physical Medicine and Rehabilitation

## 2020-02-29 DIAGNOSIS — J3089 Other allergic rhinitis: Secondary | ICD-10-CM

## 2020-02-29 NOTE — Progress Notes (Signed)
Vials exp 02-28-21

## 2020-03-01 DIAGNOSIS — J301 Allergic rhinitis due to pollen: Secondary | ICD-10-CM

## 2020-03-11 ENCOUNTER — Ambulatory Visit: Admit: 2020-03-11 | Payer: PRIVATE HEALTH INSURANCE | Primary: Family Medicine

## 2020-03-11 DIAGNOSIS — Z23 Encounter for immunization: Secondary | ICD-10-CM

## 2020-03-16 ENCOUNTER — Other Ambulatory Visit: Payer: Self-pay | Admitting: Allergy and Immunology

## 2020-03-19 ENCOUNTER — Encounter: Payer: Medicare HMO | Admitting: Physical Medicine and Rehabilitation

## 2020-03-20 ENCOUNTER — Ambulatory Visit (INDEPENDENT_AMBULATORY_CARE_PROVIDER_SITE_OTHER): Payer: Medicare HMO

## 2020-03-20 DIAGNOSIS — J309 Allergic rhinitis, unspecified: Secondary | ICD-10-CM | POA: Diagnosis not present

## 2020-03-28 ENCOUNTER — Ambulatory Visit (INDEPENDENT_AMBULATORY_CARE_PROVIDER_SITE_OTHER): Payer: Medicare HMO | Admitting: Orthopaedic Surgery

## 2020-03-28 ENCOUNTER — Other Ambulatory Visit: Payer: Self-pay

## 2020-03-28 ENCOUNTER — Encounter: Payer: Self-pay | Admitting: Orthopaedic Surgery

## 2020-03-28 DIAGNOSIS — M1611 Unilateral primary osteoarthritis, right hip: Secondary | ICD-10-CM | POA: Diagnosis not present

## 2020-03-28 DIAGNOSIS — M25551 Pain in right hip: Secondary | ICD-10-CM | POA: Diagnosis not present

## 2020-03-28 NOTE — Progress Notes (Signed)
The patient has known osteoarthritis of her right hip that is now significantly worsening.  Her x-rays still confirm severe end-stage arthritis of the right hip.  She has had an intra-articular injection with a steroid that helped her feel significantly better but only for about 2 weeks.  The pain is causing her to cry at this point.  She is a very active 59 year old female.  I have already told her in the past about hip replacement surgery.  We had to provide her hydrocodone before for her pain I told her I cannot keep her on that medication.  She has had no other acute change in her medical status but her hip is hurting quite severely again.  She has been on anti-inflammatories as well.  On exam she has severe pain with internal ex rotation of her right hip.  Her left hip moves smoothly.  Her x-rays confirm again from her earlier visit this year that she does have severe end-stage arthritis of the right hip with joint space narrowing and peritracheal osteophytes as well as sclerotic changes in the femoral head and acetabulum.  At this point I did give her another handout about hip replacement surgery.  I explained in detail what the surgery involves.  We discussed the risk and benefits of surgery.  We talked about her operative and postoperative course.  All questions and concerns were answered and addressed.  Given the severity of her pain and her arthritis she does wish to proceed with the surgery in the near future.

## 2020-03-30 ENCOUNTER — Other Ambulatory Visit: Payer: Self-pay

## 2020-04-09 ENCOUNTER — Other Ambulatory Visit: Payer: Self-pay | Admitting: Specialist

## 2020-04-10 ENCOUNTER — Ambulatory Visit (INDEPENDENT_AMBULATORY_CARE_PROVIDER_SITE_OTHER): Payer: Medicare HMO

## 2020-04-10 DIAGNOSIS — J309 Allergic rhinitis, unspecified: Secondary | ICD-10-CM

## 2020-04-23 ENCOUNTER — Other Ambulatory Visit: Payer: Self-pay | Admitting: Family Medicine

## 2020-05-01 ENCOUNTER — Other Ambulatory Visit: Payer: Self-pay | Admitting: Specialist

## 2020-05-02 ENCOUNTER — Ambulatory Visit (INDEPENDENT_AMBULATORY_CARE_PROVIDER_SITE_OTHER): Payer: Medicare HMO

## 2020-05-02 DIAGNOSIS — J309 Allergic rhinitis, unspecified: Secondary | ICD-10-CM | POA: Diagnosis not present

## 2020-05-04 ENCOUNTER — Encounter (HOSPITAL_COMMUNITY): Admission: RE | Payer: Self-pay | Source: Home / Self Care

## 2020-05-04 ENCOUNTER — Ambulatory Visit (HOSPITAL_COMMUNITY): Admission: RE | Admit: 2020-05-04 | Payer: Medicare HMO | Source: Home / Self Care | Admitting: Orthopaedic Surgery

## 2020-05-04 SURGERY — ARTHROPLASTY, HIP, TOTAL, ANTERIOR APPROACH
Anesthesia: Choice | Site: Hip | Laterality: Right

## 2020-05-09 ENCOUNTER — Other Ambulatory Visit: Payer: Self-pay | Admitting: Allergy and Immunology

## 2020-05-17 ENCOUNTER — Inpatient Hospital Stay: Payer: Medicare HMO | Admitting: Physician Assistant

## 2020-06-01 ENCOUNTER — Ambulatory Visit (INDEPENDENT_AMBULATORY_CARE_PROVIDER_SITE_OTHER): Payer: Medicare HMO

## 2020-06-01 DIAGNOSIS — J309 Allergic rhinitis, unspecified: Secondary | ICD-10-CM | POA: Diagnosis not present

## 2020-07-04 ENCOUNTER — Other Ambulatory Visit: Payer: Self-pay | Admitting: Allergy and Immunology

## 2020-07-04 ENCOUNTER — Other Ambulatory Visit: Payer: Self-pay | Admitting: Allergy

## 2020-07-04 MED ORDER — OMEPRAZOLE 20 MG PO CPDR: 20.0000 mg | DELAYED_RELEASE_CAPSULE | Freq: Two times a day (BID) | ORAL | 0 refills | Status: DC | PRN

## 2020-07-04 NOTE — Telephone Encounter (Signed)
Sent med in to [facility]- pt needs to keep her appt for 07/11/20.

## 2020-07-04 NOTE — Telephone Encounter (Signed)
Patient is requesting a refill for omeprazole. [facility] on [address].

## 2020-07-06 ENCOUNTER — Ambulatory Visit (INDEPENDENT_AMBULATORY_CARE_PROVIDER_SITE_OTHER): Payer: Medicare HMO

## 2020-07-06 DIAGNOSIS — J309 Allergic rhinitis, unspecified: Secondary | ICD-10-CM | POA: Diagnosis not present

## 2020-07-10 ENCOUNTER — Ambulatory Visit (INDEPENDENT_AMBULATORY_CARE_PROVIDER_SITE_OTHER): Payer: Medicare HMO

## 2020-07-10 DIAGNOSIS — J309 Allergic rhinitis, unspecified: Secondary | ICD-10-CM

## 2020-07-11 ENCOUNTER — Ambulatory Visit: Payer: Medicare HMO | Admitting: Allergy

## 2020-07-18 ENCOUNTER — Ambulatory Visit (INDEPENDENT_AMBULATORY_CARE_PROVIDER_SITE_OTHER): Payer: Medicare HMO | Admitting: *Deleted

## 2020-07-18 DIAGNOSIS — J309 Allergic rhinitis, unspecified: Secondary | ICD-10-CM | POA: Diagnosis not present

## 2020-07-23 ENCOUNTER — Ambulatory Visit (INDEPENDENT_AMBULATORY_CARE_PROVIDER_SITE_OTHER): Payer: Medicare HMO

## 2020-07-23 DIAGNOSIS — J309 Allergic rhinitis, unspecified: Secondary | ICD-10-CM | POA: Diagnosis not present

## 2020-07-26 ENCOUNTER — Ambulatory Visit: Payer: Medicare HMO | Admitting: Allergy & Immunology

## 2020-08-07 ENCOUNTER — Other Ambulatory Visit: Payer: Self-pay | Admitting: Allergy and Immunology

## 2020-08-12 ENCOUNTER — Other Ambulatory Visit: Payer: Self-pay | Admitting: Allergy & Immunology

## 2020-08-13 ENCOUNTER — Ambulatory Visit (INDEPENDENT_AMBULATORY_CARE_PROVIDER_SITE_OTHER): Payer: Medicare HMO

## 2020-08-13 DIAGNOSIS — J309 Allergic rhinitis, unspecified: Secondary | ICD-10-CM

## 2020-08-30 ENCOUNTER — Ambulatory Visit: Payer: Medicare HMO | Admitting: Allergy & Immunology

## 2020-09-03 ENCOUNTER — Ambulatory Visit (INDEPENDENT_AMBULATORY_CARE_PROVIDER_SITE_OTHER): Payer: Medicare HMO

## 2020-09-03 DIAGNOSIS — J309 Allergic rhinitis, unspecified: Secondary | ICD-10-CM

## 2020-09-13 ENCOUNTER — Ambulatory Visit: Payer: Medicare HMO | Admitting: Allergy & Immunology

## 2020-09-25 ENCOUNTER — Ambulatory Visit (INDEPENDENT_AMBULATORY_CARE_PROVIDER_SITE_OTHER): Payer: Medicare HMO

## 2020-09-25 DIAGNOSIS — J309 Allergic rhinitis, unspecified: Secondary | ICD-10-CM

## 2020-10-01 ENCOUNTER — Other Ambulatory Visit: Payer: Self-pay | Admitting: Allergy

## 2020-10-16 ENCOUNTER — Ambulatory Visit (INDEPENDENT_AMBULATORY_CARE_PROVIDER_SITE_OTHER): Payer: Medicare HMO

## 2020-10-16 DIAGNOSIS — J309 Allergic rhinitis, unspecified: Secondary | ICD-10-CM

## 2020-10-22 DIAGNOSIS — J3089 Other allergic rhinitis: Secondary | ICD-10-CM

## 2020-10-22 NOTE — Progress Notes (Signed)
Vials exp 10-22-21

## 2020-10-23 ENCOUNTER — Encounter: Payer: Self-pay | Admitting: Allergy & Immunology

## 2020-10-23 ENCOUNTER — Other Ambulatory Visit: Payer: Self-pay

## 2020-10-23 ENCOUNTER — Ambulatory Visit: Payer: Medicare HMO | Admitting: Allergy & Immunology

## 2020-10-23 VITALS — BP 140/82 | HR 90 | Temp 98.0°F | Ht 64.0 in | Wt 161.0 lb

## 2020-10-23 DIAGNOSIS — J3089 Other allergic rhinitis: Secondary | ICD-10-CM | POA: Diagnosis not present

## 2020-10-23 DIAGNOSIS — J4540 Moderate persistent asthma, uncomplicated: Secondary | ICD-10-CM | POA: Diagnosis not present

## 2020-10-23 DIAGNOSIS — K219 Gastro-esophageal reflux disease without esophagitis: Secondary | ICD-10-CM | POA: Diagnosis not present

## 2020-10-23 DIAGNOSIS — J302 Other seasonal allergic rhinitis: Secondary | ICD-10-CM | POA: Diagnosis not present

## 2020-10-23 NOTE — Progress Notes (Signed)
FOLLOW UP  Date of Service/Encounter:  10/23/20   Assessment:   Moderate persistent asthma, uncomplicated  Perennial and seasonal allergic rhinitis (grasses, ragweed, weeds, molds, cat, dog, dust mite) - on allergen immunotherapy (started June 2019 and reached maintenance in October 2019)  Chronic pain  Disabled status  Plan/Recommendations:   1. Moderate persistent asthma, uncomplicated - Arlyce Harman looks great today.  - Consider melatonin over the counter to help with your sleeping.  - Daily controller medication(s): Singulair 71m daily and Symbicort 160/4.513m two puffs twice daily with spacer - Prior to physical activity: albuterol 2 puffs 10-15 minutes before physical activity. - Rescue medications: albuterol 4 puffs every 4-6 hours as needed - Asthma control goals:  * Full participation in all desired activities (may need albuterol before activity) * Albuterol use two time or less a week on average (not counting use with activity) * Cough interfering with sleep two time or less a month * Oral steroids no more than once a year * No hospitalizations  2. Perennial and seasonal allergic rhinitis - Continue with allergy shots at the same schedule. - Continue with fluticasone nasal spray daily to help control runny nose as needed.  - Continue with azelastine nasal spray one spray per nostril up to twice daily as needed. - Continue with your cetirizine and the montelukast.   3. Return in about 6 months (around 04/23/2021).   Subjective:   Sheila Vincent a [age].o. female presenting today for follow up of  Chief Complaint  Patient presents with  . Asthma    Sheila Testas a history of the following: Patient Active Problem List   Diagnosis Date Noted  . Unilateral primary osteoarthritis, right hip 01/18/2020  . Eustachian tube dysfunction, right 09/05/2019  . Bug bite 04/21/2019  . Murmur 04/21/2019  . GERD (gastroesophageal reflux disease) 04/06/2018  .  Cough, persistent 04/06/2018  . Hypertension 03/30/2018  . Perennial and seasonal allergic rhinitis 07/14/2015  . Moderate persistent asthma 07/14/2015  . Upper airway cough syndrome 12/06/2014  . Right foot pain 07/06/2014  . Low back pain 07/06/2014    History obtained from: chart review and patient.  [name] a [age].o. female presenting for a follow up visit. She was last seen earlier this year and by me in April 2020. When she was seen by Dr. BoVerlin Festerarlier this year, she was advanced to Symbicort since she was having some increased symptoms with the Flovent. She remained on her allergen immunotherapy.   Since the last visit, she has done very well. She is very happy with how well she is doing.   Asthma/Respiratory Symptom History: She has only used her nebulizer once or twice. She remains on Symbicort two puffs BID. Sheila Vincent's asthma has been well controlled. She has not required rescue medication, experienced nocturnal awakenings due to lower respiratory symptoms, nor have activities of daily living been limited. She has required no Emergency Department or Urgent Care visits for her asthma. She has required zero courses of systemic steroids for asthma exacerbations since the last visit. ACT score today is 20, indicating excellent asthma symptom control. She does report that it is hard for her to go to sleep. She feels that she is doing fairly well.   Allergic Rhinitis Symptom History: She remains on her nasal spray every day. She is on the cetirizine and the montelukast daily. She has not tried getting off of any of these medications. She is afraid it will start back up again  if she stops.   Sheila Vincent is on allergen immunotherapy. She receives two injections. Immunotherapy script #1 contains weeds, grasses, molds, dust mites, cat and dog. She currently receives 0.59m of the RED vial (1/100). Immunotherapy script #2 contains molds. She currently receives 0.351mof the RED vial (1/100).  She started shots June of 2019 and reached maintenance in October 20219.   She does report some problems with falling asleep. She has not tried using anything to treat this, including melatonin. She has not discussed this with her PCP.   Otherwise, there have been no changes to her past medical history, surgical history, family history, or social history.    Review of Systems  Constitutional: Negative.  Negative for chills, fever, malaise/fatigue and weight loss.  HENT: Negative for congestion, ear discharge, ear pain and sinus pain.   Eyes: Negative for pain, discharge and redness.  Respiratory: Positive for cough. Negative for sputum production, shortness of breath and wheezing.   Cardiovascular: Negative.  Negative for chest pain and palpitations.  Gastrointestinal: Negative for abdominal pain, constipation, diarrhea, heartburn, nausea and vomiting.  Skin: Negative.  Negative for itching and rash.  Neurological: Negative for dizziness and headaches.  Endo/Heme/Allergies: Positive for environmental allergies. Does not bruise/bleed easily.       Objective:   Blood pressure 140/82, pulse 90, temperature 98 F (36.7 C), temperature source Temporal, height 5' 4"  (1.626 m), weight 161 lb (73 kg), SpO2 100 %. Body mass index is 27.64 kg/m.   Physical Exam:  Physical Exam Constitutional:      Appearance: She is well-developed.     Comments: Pleasant and interactive.   HENT:     Head: Normocephalic and atraumatic.     Right Ear: Tympanic membrane, ear canal and external ear normal.     Left Ear: Tympanic membrane, ear canal and external ear normal.     Nose: No nasal deformity, septal deviation, mucosal edema, rhinorrhea or epistaxis.     Right Turbinates: Enlarged and swollen.     Left Turbinates: Enlarged and swollen.     Right Sinus: No maxillary sinus tenderness or frontal sinus tenderness.     Left Sinus: No maxillary sinus tenderness or frontal sinus tenderness.      Mouth/Throat:     Mouth: Oropharynx is clear and moist. Mucous membranes are not pale and not dry.     Pharynx: Uvula midline.  Eyes:     General:        Right eye: No discharge.        Left eye: No discharge.     Extraocular Movements: EOM normal.     Conjunctiva/sclera: Conjunctivae normal.     Right eye: Right conjunctiva is not injected. No chemosis.    Left eye: Left conjunctiva is not injected. No chemosis.    Pupils: Pupils are equal, round, and reactive to light.  Cardiovascular:     Rate and Rhythm: Normal rate and regular rhythm.     Heart sounds: Normal heart sounds.  Pulmonary:     Effort: Pulmonary effort is normal. No tachypnea, accessory muscle usage or respiratory distress.     Breath sounds: Normal breath sounds. No wheezing, rhonchi or rales.     Comments: Moving air well in all lung fields. Chest:     Chest wall: No tenderness.  Lymphadenopathy:     Cervical: No cervical adenopathy.  Skin:    Coloration: Skin is not pale.     Findings: No abrasion, erythema, petechiae or rash. Rash  is not papular, urticarial or vesicular.     Comments: No eczematous or urticarial lesions noted.  Neurological:     Mental Status: She is alert.  Psychiatric:        Mood and Affect: Mood and affect normal.      Diagnostic studies:    Spirometry: results normal (FEV1: 1.94/76%, FVC: 2.70/82%, FEV1/FVC: 72%).    Spirometry consistent with normal pattern.   Allergy Studies: none       Salvatore Marvel, MD  [facility]

## 2020-10-23 NOTE — Patient Instructions (Addendum)
1. Moderate persistent asthma, uncomplicated - Sheila Vincent looks great today.  - Consider melatonin over the counter to help with your sleeping.  - Daily controller medication(s): Singulair 39m daily and Symbicort 160/4.513m two puffs twice daily with spacer - Prior to physical activity: albuterol 2 puffs 10-15 minutes before physical activity. - Rescue medications: albuterol 4 puffs every 4-6 hours as needed - Asthma control goals:  * Full participation in all desired activities (may need albuterol before activity) * Albuterol use two time or less a week on average (not counting use with activity) * Cough interfering with sleep two time or less a month * Oral steroids no more than once a year * No hospitalizations  2. Perennial and seasonal allergic rhinitis - Continue with allergy shots at the same schedule. - Continue with fluticasone nasal spray daily to help control runny nose as needed.  - Continue with azelastine nasal spray one spray per nostril up to twice daily as needed. - Continue with your cetirizine and the montelukast.   3. Return in about 6 months (around 04/23/2021).    Please inform usKoreaf any Emergency Department visits, hospitalizations, or changes in symptoms. Call usKoreaefore going to the ED for breathing or allergy symptoms since we might be able to fit you in for a sick visit. Feel free to contact usKoreanytime with any questions, problems, or concerns.  It was a pleasure to see you again today!  Websites that have reliable patient information: 1. American Academy of Asthma, Allergy, and Immunology: www.aaaai.org 2. Food Allergy Research and Education (FARE): foodallergy.org 3. Mothers of Asthmatics: http://www.asthmacommunitynetwork.org 4. American College of Allergy, Asthma, and Immunology: www.acaai.org   COVID-19 Vaccine Information can be found at: [email or web address] questions related to vaccine  distribution or appointments, please email [email or web address] or call [phone]    "Like" us[facility] Facebook and Instagram for our latest updates!       Make sure you are registered to vote! If you have moved or changed any of your contact information, you will need to get this updated before voting!  In some cases, you MAY be able to register to vote online: [email or web address]

## 2020-10-24 DIAGNOSIS — J301 Allergic rhinitis due to pollen: Secondary | ICD-10-CM

## 2020-11-05 ENCOUNTER — Ambulatory Visit (INDEPENDENT_AMBULATORY_CARE_PROVIDER_SITE_OTHER): Payer: Medicare HMO | Admitting: *Deleted

## 2020-11-05 DIAGNOSIS — J309 Allergic rhinitis, unspecified: Secondary | ICD-10-CM

## 2020-11-26 ENCOUNTER — Ambulatory Visit (INDEPENDENT_AMBULATORY_CARE_PROVIDER_SITE_OTHER): Payer: Medicare HMO

## 2020-11-26 DIAGNOSIS — J309 Allergic rhinitis, unspecified: Secondary | ICD-10-CM

## 2020-12-06 ENCOUNTER — Other Ambulatory Visit: Payer: Self-pay | Admitting: Allergy and Immunology

## 2020-12-10 ENCOUNTER — Other Ambulatory Visit: Payer: Self-pay

## 2020-12-10 ENCOUNTER — Encounter (HOSPITAL_COMMUNITY): Payer: Self-pay

## 2020-12-10 ENCOUNTER — Emergency Department (HOSPITAL_COMMUNITY)
Admission: EM | Admit: 2020-12-10 | Discharge: 2020-12-10 | Disposition: A | Discharge status: Home / Self Care | Payer: Medicare HMO | Attending: Emergency Medicine | Admitting: Emergency Medicine

## 2020-12-10 ENCOUNTER — Emergency Department (HOSPITAL_COMMUNITY): Payer: Medicare HMO

## 2020-12-10 DIAGNOSIS — Z7951 Long term (current) use of inhaled steroids: Secondary | ICD-10-CM | POA: Diagnosis not present

## 2020-12-10 DIAGNOSIS — I10 Essential (primary) hypertension: Secondary | ICD-10-CM | POA: Insufficient documentation

## 2020-12-10 DIAGNOSIS — J4540 Moderate persistent asthma, uncomplicated: Secondary | ICD-10-CM | POA: Insufficient documentation

## 2020-12-10 DIAGNOSIS — X501XXA Overexertion from prolonged static or awkward postures, initial encounter: Secondary | ICD-10-CM | POA: Insufficient documentation

## 2020-12-10 DIAGNOSIS — M25511 Pain in right shoulder: Secondary | ICD-10-CM

## 2020-12-10 NOTE — Discharge Instructions (Addendum)
You came to the emergency department today to have your right shoulder pain evaluated.  Your x-ray showed no fracture or dislocation.  It did show some mild degenerative change in the right shoulder.  Your physical exam was reassuring.  Please follow-up with your orthopedic doctor, Dr. Lynann Bologna.  Until then please manage any pain you have with over-the-counter Tylenol and ibuprofen.  You may use ice or heat on your right shoulder.    Get help right away if: Your arm, hand, or fingers: Tingle. Become numb. Become swollen. Become painful. Turn white or blue.  Please take Ibuprofen (Advil, motrin) and Tylenol (acetaminophen) to relieve your pain.    You may take up to 600 MG (3 pills) of normal strength ibuprofen every 8 hours as needed.   You make take tylenol, up to 1,000 mg (two extra strength pills) every 8 hours as needed.   It is safe to take ibuprofen and tylenol at the same time as they work differently.   Do not take more than 3,000 mg tylenol in a 24 hour period (not more than one dose every 8 hours.  Please check all medication labels as many medications such as pain and cold medications may contain tylenol.  Do not drink alcohol while taking these medications.  Do not take other NSAID'S while taking ibuprofen (such as aleve or naproxen).  Please take ibuprofen with food to decrease stomach upset.

## 2020-12-10 NOTE — ED Triage Notes (Signed)
Pt reports she slept on her right side and woke up Saturday morning had felt a pop in her right shoulder when she moved. Pt now endorses right shoulder pain and limited ROM.
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
## 2020-12-17 ENCOUNTER — Ambulatory Visit (INDEPENDENT_AMBULATORY_CARE_PROVIDER_SITE_OTHER): Payer: Medicare HMO | Admitting: *Deleted

## 2020-12-17 DIAGNOSIS — J309 Allergic rhinitis, unspecified: Secondary | ICD-10-CM | POA: Diagnosis not present

## 2020-12-25 ENCOUNTER — Ambulatory Visit (INDEPENDENT_AMBULATORY_CARE_PROVIDER_SITE_OTHER): Payer: Medicare HMO | Admitting: *Deleted

## 2020-12-25 DIAGNOSIS — J309 Allergic rhinitis, unspecified: Secondary | ICD-10-CM

## 2020-12-30 ENCOUNTER — Other Ambulatory Visit: Payer: Self-pay | Admitting: Allergy & Immunology

## 2020-12-31 ENCOUNTER — Ambulatory Visit (INDEPENDENT_AMBULATORY_CARE_PROVIDER_SITE_OTHER): Payer: Medicare HMO

## 2020-12-31 DIAGNOSIS — J309 Allergic rhinitis, unspecified: Secondary | ICD-10-CM | POA: Diagnosis not present

## 2021-01-04 ENCOUNTER — Other Ambulatory Visit: Payer: Self-pay

## 2021-01-04 MED ORDER — MONTELUKAST SODIUM 10 MG PO TABS: ORAL_TABLET | ORAL | 3 refills | Status: DC

## 2021-01-07 ENCOUNTER — Ambulatory Visit (INDEPENDENT_AMBULATORY_CARE_PROVIDER_SITE_OTHER): Payer: Medicare HMO | Admitting: *Deleted

## 2021-01-07 DIAGNOSIS — J309 Allergic rhinitis, unspecified: Secondary | ICD-10-CM | POA: Diagnosis not present

## 2021-01-14 ENCOUNTER — Ambulatory Visit (INDEPENDENT_AMBULATORY_CARE_PROVIDER_SITE_OTHER): Payer: Medicare HMO

## 2021-01-14 DIAGNOSIS — J309 Allergic rhinitis, unspecified: Secondary | ICD-10-CM

## 2021-01-31 ENCOUNTER — Other Ambulatory Visit: Payer: Self-pay | Admitting: Allergy & Immunology

## 2021-02-04 ENCOUNTER — Other Ambulatory Visit: Payer: Medicare HMO

## 2021-02-04 ENCOUNTER — Ambulatory Visit: Payer: Medicare HMO | Admitting: Nurse Practitioner

## 2021-02-04 ENCOUNTER — Other Ambulatory Visit (INDEPENDENT_AMBULATORY_CARE_PROVIDER_SITE_OTHER): Payer: Medicare HMO

## 2021-02-04 ENCOUNTER — Encounter: Payer: Self-pay | Admitting: Nurse Practitioner

## 2021-02-04 ENCOUNTER — Ambulatory Visit (INDEPENDENT_AMBULATORY_CARE_PROVIDER_SITE_OTHER): Payer: Medicare HMO | Admitting: *Deleted

## 2021-02-04 VITALS — BP 152/74 | HR 86 | Ht 64.0 in | Wt 161.0 lb

## 2021-02-04 DIAGNOSIS — J309 Allergic rhinitis, unspecified: Secondary | ICD-10-CM | POA: Diagnosis not present

## 2021-02-04 DIAGNOSIS — R1010 Upper abdominal pain, unspecified: Secondary | ICD-10-CM

## 2021-02-04 DIAGNOSIS — R197 Diarrhea, unspecified: Secondary | ICD-10-CM

## 2021-02-04 DIAGNOSIS — K921 Melena: Secondary | ICD-10-CM

## 2021-02-04 LAB — COMPREHENSIVE METABOLIC PANEL
ALT: 13 U/L (ref 0–35)
AST: 12 U/L (ref 0–37)
Albumin: 4.1 g/dL (ref 3.5–5.2)
Alkaline Phosphatase: 126 U/L — ABNORMAL HIGH (ref 39–117)
BUN: 10 mg/dL (ref 6–23)
CO2: 28 mEq/L (ref 19–32)
Calcium: 8.9 mg/dL (ref 8.4–10.5)
Chloride: 102 mEq/L (ref 96–112)
Creatinine, Ser: 0.64 mg/dL (ref 0.40–1.20)
GFR: 95.28 mL/min (ref >60.00)
Glucose, Bld: 131 mg/dL — ABNORMAL HIGH (ref 70–99)
Potassium: 4 mEq/L (ref 3.5–5.1)
Sodium: 137 mEq/L (ref 135–145)
Total Bilirubin: 0.3 mg/dL (ref 0.2–1.2)
Total Protein: 7.1 g/dL (ref 6.0–8.3)

## 2021-02-04 LAB — C-REACTIVE PROTEIN: CRP: 1 mg/dL (ref 0.5–20.0)

## 2021-02-04 LAB — SEDIMENTATION RATE: Sed Rate: 24 mm/hr (ref 0–30)

## 2021-02-04 NOTE — Patient Instructions (Addendum)
If you are age 62 or younger, your body mass index should be between 19-25. Your Body mass index is 27.64 kg/m. If this is out of the aformentioned range listed, please consider follow up with your Primary Care Provider.   LABS:  Lab work has been ordered for you today. Our lab is located in the basement. Press "B" on the elevator. The lab is located at the first door on the left as you exit the elevator.  A colonoscopy will be scheduled pending your test results.  It was great seeing you today! Thank you for entrusting me with your care and choosing [facility].  Tye Savoy, NP
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
## 2021-02-05 ENCOUNTER — Other Ambulatory Visit: Payer: Self-pay

## 2021-02-05 ENCOUNTER — Other Ambulatory Visit: Payer: Medicare HMO

## 2021-02-05 ENCOUNTER — Telehealth: Payer: Self-pay | Admitting: Nurse Practitioner

## 2021-02-05 DIAGNOSIS — R197 Diarrhea, unspecified: Secondary | ICD-10-CM

## 2021-02-05 DIAGNOSIS — D649 Anemia, unspecified: Secondary | ICD-10-CM

## 2021-02-05 DIAGNOSIS — K921 Melena: Secondary | ICD-10-CM

## 2021-02-05 LAB — CBC WITH DIFFERENTIAL/PLATELET
Absolute Monocytes: 923 cells/uL (ref 200–950)
Basophils Absolute: 57 cells/uL (ref 0–200)
Basophils Relative: 0.5 %
Eosinophils Absolute: 205 cells/uL (ref 15–500)
Eosinophils Relative: 1.8 %
HCT: 29.1 % — ABNORMAL LOW (ref 35.0–45.0)
Hemoglobin: 8.1 g/dL — ABNORMAL LOW (ref 11.7–15.5)
Lymphs Abs: 2599 cells/uL (ref 850–3900)
MCH: 17 pg — ABNORMAL LOW (ref 27.0–33.0)
MCHC: 27.8 g/dL — ABNORMAL LOW (ref 32.0–36.0)
MCV: 61 fL — ABNORMAL LOW (ref 80.0–100.0)
MPV: 8.8 fL (ref 7.5–12.5)
Monocytes Relative: 8.1 %
Neutro Abs: 7615 cells/uL (ref 1500–7800)
Neutrophils Relative %: 66.8 %
Platelets: 510 10*3/uL — ABNORMAL HIGH (ref 140–400)
RBC: 4.77 10*6/uL (ref 3.80–5.10)
RDW: 17 % — ABNORMAL HIGH (ref 11.0–15.0)
Total Lymphocyte: 22.8 %
WBC: 11.4 10*3/uL — ABNORMAL HIGH (ref 3.8–10.8)

## 2021-02-05 NOTE — Telephone Encounter (Signed)
See results notes for details.

## 2021-02-05 NOTE — Telephone Encounter (Signed)
Patient returned your call about results, please call patient one more time.

## 2021-02-06 ENCOUNTER — Other Ambulatory Visit (INDEPENDENT_AMBULATORY_CARE_PROVIDER_SITE_OTHER): Payer: Medicare HMO

## 2021-02-06 DIAGNOSIS — D649 Anemia, unspecified: Secondary | ICD-10-CM

## 2021-02-06 LAB — IBC + FERRITIN
Ferritin: 4.6 ng/mL — ABNORMAL LOW (ref 10.0–291.0)
Iron: 17 ug/dL — ABNORMAL LOW (ref 42–145)
Saturation Ratios: 3.6 % — ABNORMAL LOW (ref 20.0–50.0)
Transferrin: 341 mg/dL (ref 212.0–360.0)

## 2021-02-06 NOTE — Addendum Note (Signed)
Addended by: Trenda Moots on: 07/11/698 11:00 AM   Modules accepted: Orders

## 2021-02-06 NOTE — Progress Notes (Signed)
Reviewed and agree with management plans. Obtain prior records regarding evaluation and treatment of Crohn's would be helpful for longterm management recommendations. Agree with colonoscopy to exclude colitis. May benefit from empiric trial of rifaxamin for possible IBS +/- SIBO if blood and stool studies are non-diagnostic.   Lanny L. Tarri Glenn, MD, MPH

## 2021-02-07 LAB — GI PROFILE, STOOL, PCR

## 2021-02-07 LAB — TISSUE TRANSGLUTAMINASE ABS,IGG,IGA
(tTG) Ab, IgA: <1 U/mL
(tTG) Ab, IgG: <1 U/mL

## 2021-02-07 LAB — IGA: Immunoglobulin A: 172 mg/dL (ref 70–320)

## 2021-02-08 ENCOUNTER — Telehealth: Payer: Self-pay | Admitting: Nurse Practitioner

## 2021-02-08 NOTE — Telephone Encounter (Signed)
Inbound call from patient requesting a call back with lab results please.

## 2021-02-08 NOTE — Telephone Encounter (Signed)
Spoke with patient, she is aware that Nevin Bloodgood is out of the office today but will return on Monday. Advised that once she has reviewed the remainder of her labs and stool study we will her know. Patient verbalized understanding and had no concerns at the end of the call.

## 2021-02-11 NOTE — Telephone Encounter (Signed)
Patient calling to follow up on previous message and get her results still waiting.

## 2021-02-12 NOTE — Telephone Encounter (Signed)
Inbound call from patient following up with results on labs and stool sample. Please advise.

## 2021-02-12 NOTE — Telephone Encounter (Signed)
Sheila Vincent with the patient. She is continuing to have diarrhea. She is open to further testing to get answers to her symptoms. I told her I would call her back this afternoon. Thanks

## 2021-02-12 NOTE — Telephone Encounter (Signed)
Addressed. She is being scheduled for EGD / colonoscopy

## 2021-02-25 ENCOUNTER — Ambulatory Visit (INDEPENDENT_AMBULATORY_CARE_PROVIDER_SITE_OTHER): Payer: Medicare HMO | Admitting: *Deleted

## 2021-02-25 DIAGNOSIS — J309 Allergic rhinitis, unspecified: Secondary | ICD-10-CM

## 2021-03-05 ENCOUNTER — Encounter: Payer: Medicare HMO | Admitting: Gastroenterology

## 2021-03-07 ENCOUNTER — Ambulatory Visit: Payer: Medicare HMO | Admitting: Internal Medicine

## 2021-03-21 ENCOUNTER — Ambulatory Visit (INDEPENDENT_AMBULATORY_CARE_PROVIDER_SITE_OTHER): Payer: Medicare HMO

## 2021-03-21 ENCOUNTER — Ambulatory Visit: Payer: Medicare HMO | Admitting: Orthopaedic Surgery

## 2021-03-21 DIAGNOSIS — M25551 Pain in right hip: Secondary | ICD-10-CM | POA: Diagnosis not present

## 2021-03-21 DIAGNOSIS — M1611 Unilateral primary osteoarthritis, right hip: Secondary | ICD-10-CM | POA: Diagnosis not present

## 2021-03-21 DIAGNOSIS — M25552 Pain in left hip: Secondary | ICD-10-CM | POA: Diagnosis not present

## 2021-03-21 MED ORDER — METHYLPREDNISOLONE 4 MG PO TABS: ORAL_TABLET | ORAL | 0 refills | Status: DC

## 2021-03-21 MED ORDER — NABUMETONE 750 MG PO TABS: 750.0000 mg | ORAL_TABLET | Freq: Two times a day (BID) | ORAL | 3 refills | Status: DC | PRN
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
## 2021-03-22 ENCOUNTER — Ambulatory Visit (INDEPENDENT_AMBULATORY_CARE_PROVIDER_SITE_OTHER): Payer: Medicare HMO | Admitting: *Deleted

## 2021-03-22 DIAGNOSIS — J309 Allergic rhinitis, unspecified: Secondary | ICD-10-CM | POA: Diagnosis not present

## 2021-03-29 ENCOUNTER — Other Ambulatory Visit: Payer: Self-pay | Admitting: Allergy & Immunology

## 2021-03-29 NOTE — Telephone Encounter (Signed)
Dr. Ernst Bowler please advise on refills as it is not on patients after visit summary.

## 2021-04-01 ENCOUNTER — Other Ambulatory Visit: Payer: Self-pay | Admitting: Allergy & Immunology

## 2021-04-02 MED ORDER — FLUTICASONE PROPIONATE 50 MCG/ACT NA SUSP: NASAL | 0 refills | Status: DC

## 2021-04-02 NOTE — Addendum Note (Signed)
Addended by: Herbie Drape on: 04/02/2021 12:09 PM   Modules accepted: Orders

## 2021-04-06 ENCOUNTER — Other Ambulatory Visit: Payer: Self-pay | Admitting: Allergy & Immunology

## 2021-04-10 ENCOUNTER — Other Ambulatory Visit: Payer: Self-pay | Admitting: Allergy & Immunology

## 2021-04-15 NOTE — Progress Notes (Signed)
EXP  04/17/22

## 2021-04-17 DIAGNOSIS — J3089 Other allergic rhinitis: Secondary | ICD-10-CM | POA: Diagnosis not present

## 2021-04-18 ENCOUNTER — Ambulatory Visit (INDEPENDENT_AMBULATORY_CARE_PROVIDER_SITE_OTHER): Payer: Medicare HMO | Admitting: *Deleted

## 2021-04-18 ENCOUNTER — Encounter: Payer: Self-pay | Admitting: Orthopaedic Surgery

## 2021-04-18 ENCOUNTER — Ambulatory Visit: Payer: Medicare HMO | Admitting: Orthopaedic Surgery

## 2021-04-18 DIAGNOSIS — M1611 Unilateral primary osteoarthritis, right hip: Secondary | ICD-10-CM

## 2021-04-18 DIAGNOSIS — J309 Allergic rhinitis, unspecified: Secondary | ICD-10-CM | POA: Diagnosis not present

## 2021-04-18 NOTE — Progress Notes (Signed)
The patient is well-known to me.  She has significant end-stage arthritis of her right hip and we have seen her for this now for few years.  She is 60 years old and active.  She is still not ready for hip replacement surgery she states.  We will start her on anti-inflammatories at her last visit she said that is helped enough to take the edge off the pain.  She says she has no family support or anyone else that could help her get through surgery and she would rather hold off.  She has had no other acute change in her medical status.  She does walk with slight limp but does tolerate me putting her hip through range of motion.  She knows that we can refill her anti-inflammatories on a regular basis.  All questions and concerns were answered and addressed.  If he gets to the point where she would like to consider hip replacement surgery she knows to call us and come see [place].

## 2021-04-19 DIAGNOSIS — J301 Allergic rhinitis due to pollen: Secondary | ICD-10-CM

## 2021-04-23 ENCOUNTER — Ambulatory Visit: Payer: Medicare HMO | Admitting: Allergy & Immunology

## 2021-04-24 ENCOUNTER — Encounter: Payer: Medicare HMO | Admitting: Gastroenterology

## 2021-05-07 ENCOUNTER — Ambulatory Visit: Payer: Medicare HMO | Admitting: Family

## 2021-05-09 ENCOUNTER — Other Ambulatory Visit: Payer: Self-pay

## 2021-05-09 ENCOUNTER — Telehealth: Payer: Self-pay | Admitting: *Deleted

## 2021-05-09 ENCOUNTER — Ambulatory Visit: Payer: Medicare HMO | Admitting: Family Medicine

## 2021-05-09 ENCOUNTER — Encounter: Payer: Self-pay | Admitting: Family Medicine

## 2021-05-09 VITALS — BP 142/82 | HR 90 | Temp 97.3°F | Resp 16 | Ht 64.0 in | Wt 161.0 lb

## 2021-05-09 DIAGNOSIS — J3089 Other allergic rhinitis: Secondary | ICD-10-CM

## 2021-05-09 DIAGNOSIS — J4540 Moderate persistent asthma, uncomplicated: Secondary | ICD-10-CM

## 2021-05-09 MED ORDER — AZELASTINE HCL 0.1 % NA SOLN: 2.0000 | Freq: Two times a day (BID) | NASAL | 4 refills | Status: DC

## 2021-05-09 MED ORDER — ALBUTEROL SULFATE HFA 108 (90 BASE) MCG/ACT IN AERS: 2.0000 | INHALATION_SPRAY | RESPIRATORY_TRACT | 1 refills | Status: DC | PRN

## 2021-05-09 MED ORDER — MONTELUKAST SODIUM 10 MG PO TABS: 10.0000 mg | ORAL_TABLET | Freq: Every day | ORAL | 1 refills | Status: DC

## 2021-05-09 MED ORDER — BUDESONIDE-FORMOTEROL FUMARATE 160-4.5 MCG/ACT IN AERO: INHALATION_SPRAY | RESPIRATORY_TRACT | 5 refills | Status: DC

## 2021-05-09 MED ORDER — FLUTICASONE PROPIONATE 50 MCG/ACT NA SUSP: NASAL | 0 refills | Status: DC

## 2021-05-09 MED ORDER — LEVOCETIRIZINE DIHYDROCHLORIDE 5 MG PO TABS
5.0000 mg | ORAL_TABLET | Freq: Every evening | ORAL | 5 refills | Status: DC
Start: 2021-05-09 — End: 2021-05-27

## 2021-05-09 NOTE — Telephone Encounter (Signed)
Called and spoke with the patient and advised of blood pressure reading and to follow up with PCP. Patient verbalized understanding and stated that she would.

## 2021-05-09 NOTE — Telephone Encounter (Signed)
-----   Message from Dara Hoyer, FNP sent at 05/09/2021  6:45 PM EDT ----- Can you please call this patient and let her know that her blood pressure was slightly elevated at her visit at the clinic today.  Please have her follow-up with her primary care provider for further evaluation of her blood pressure.  Thank you

## 2021-05-09 NOTE — Patient Instructions (Addendum)
Asthma Continue montelukast 10 mg once a day to prevent cough or wheeze Continue albuterol (red inhaler) 2 puffs once every 4 hours as needed for cough or wheeze You may use albuterol 2 puffs 5 to 15 minutes before activity to decrease cough or wheeze For asthma flare, begin Symbicort 160-2 puffs twice a day for 2 weeks or until cough and wheeze free, then stop  Allergic rhinitis Continue allergen avoidance measures directed toward grass pollen, weed pollen, dust mites, cat, dog, and mold as listed below Continue allergen immunotherapy and have access to an epinephrine autoinjector set Continue cetirizine 10 mg once a day as needed for runny nose or itch Continue Flonase 2 sprays in each nostril once a day as needed for stuffy nose Continue azelastine 2 sprays in each nostril twice a day as needed for runny nose Consider saline nasal rinses as needed for nasal symptoms. Use this before any medicated nasal sprays for best result  Your blood pressure was elevated at today's visit.  Be sure to follow-up with your primary care provider for further evaluation of your blood pressure  Call the clinic if this treatment plan is not working well for you.  Follow up in 6 months or sooner if needed.  Reducing Pollen Exposure The American Academy of Allergy, Asthma and Immunology suggests the following steps to reduce your exposure to pollen during allergy seasons. Do not hang sheets or clothing out to dry; pollen may collect on these items. Do not mow lawns or spend time around freshly cut grass; mowing stirs up pollen. Keep windows closed at night.  Keep car windows closed while driving. Minimize morning activities outdoors, a time when pollen counts are usually at their highest. Stay indoors as much as possible when pollen counts or humidity is high and on windy days when pollen tends to remain in the air longer. Use air conditioning when possible.  Many air conditioners have filters that trap the  pollen spores. Use a HEPA room air filter to remove pollen form the indoor air you breathe.  Control of Mold Allergen Mold and fungi can grow on a variety of surfaces provided certain temperature and moisture conditions exist.  Outdoor molds grow on plants, decaying vegetation and soil.  The major outdoor mold, Alternaria and Cladosporium, are found in very high numbers during hot and dry conditions.  Generally, a late Summer - Fall peak is seen for common outdoor fungal spores.  Rain will temporarily lower outdoor mold spore count, but counts rise rapidly when the rainy period ends.  The most important indoor molds are Aspergillus and Penicillium.  Dark, humid and poorly ventilated basements are ideal sites for mold growth.  The next most common sites of mold growth are the bathroom and the kitchen.  Outdoor [employer] Use air conditioning and keep windows closed Avoid exposure to decaying vegetation. Avoid leaf raking. Avoid grain handling. Consider wearing a face mask if working in moldy areas.  Indoor Mold Control Maintain humidity below 50%. Clean washable surfaces with 5% bleach solution. Remove sources e.g. Contaminated carpets.  Control of Dog or Cat Allergen Avoidance is the best way to manage a dog or cat allergy. If you have a dog or cat and are allergic to dog or cats, consider removing the dog or cat from the home. If you have a dog or cat but don't want to find it a new home, or if your family wants a pet even though someone in the household is allergic, here  are some strategies that may help keep symptoms at bay:  Keep the pet out of your bedroom and restrict it to only a few rooms. Be advised that keeping the dog or cat in only one room will not limit the allergens to that room. Don't pet, hug or kiss the dog or cat; if you do, wash your hands with soap and water. High-efficiency particulate air (HEPA) cleaners run continuously in a bedroom or living room can reduce allergen  levels over time. Regular use of a high-efficiency vacuum cleaner or a central vacuum can reduce allergen levels. Giving your dog or cat a bath at least once a week can reduce airborne allergen.   Control of Dust Mite Allergen Dust mites play a major role in allergic asthma and rhinitis. They occur in environments with high humidity wherever human skin is found. Dust mites absorb humidity from the atmosphere (ie, they do not drink) and feed on organic matter (including shed human and animal skin). Dust mites are a microscopic type of insect that you cannot see with the naked eye. High levels of dust mites have been detected from mattresses, pillows, carpets, upholstered furniture, bed covers, clothes, soft toys and any woven material. The principal allergen of the dust mite is found in its feces. A gram of dust may contain 1,000 mites and 250,000 fecal particles. Mite antigen is easily measured in the air during house cleaning activities. Dust mites do not bite and do not cause harm to humans, other than by triggering allergies/asthma.  Ways to decrease your exposure to dust mites in your home:  1. Encase mattresses, box springs and pillows with a mite-impermeable barrier or cover  2. Wash sheets, blankets and drapes weekly in hot water (130 F) with detergent and dry them in a dryer on the hot setting.  3. Have the room cleaned frequently with a vacuum cleaner and a damp dust-mop. For carpeting or rugs, vacuuming with a vacuum cleaner equipped with a high-efficiency particulate air (HEPA) filter. The dust mite allergic individual should not be in a room which is being cleaned and should wait 1 hour after cleaning before going into the room.  4. Do not sleep on upholstered furniture (eg, couches).  5. If possible removing carpeting, upholstered furniture and drapery from the home is ideal. Horizontal blinds should be eliminated in the rooms where the person spends the most time (bedroom, study,  television room). Washable vinyl, roller-type shades are optimal.  6. Remove all non-washable stuffed toys from the bedroom. Wash stuffed toys weekly like sheets and blankets above.  7. Reduce indoor humidity to less than 50%. Inexpensive humidity monitors can be purchased at most hardware stores. Do not use a humidifier as can make the problem worse and are not recommended.

## 2021-05-09 NOTE — Progress Notes (Signed)
[place] [address] Dept: [phone]  FOLLOW UP NOTE  Patient ID: Sheila Vincent, female    DOB: 1960-12-17  Age: 60 y.o. MRN: [id number] Date of Office Visit: 05/09/2021  Assessment  Chief Complaint: Allergic Reaction (Has been good now that she is on immunotherapy shots.  ) and Asthma (ACT -19 )  HPI Ellisa Vincent is a 60 year old female who presents the clinic for follow-up visit.  She was last seen in this clinic on 10/23/2020 by Dr. Ernst Bowler for evaluation of asthma and allergic rhinitis on allergen immunotherapy.  At today's visit, she reports her asthma has been well controlled with occasional shortness of breath with vigorous activity.  She denies shortness of breath, cough, bone wheeze with moderate activity and rest.  She continues montelukast 10 mg once a day and is currently using Symbicort and albuterol interchangeably about 1 time every 2 months.  She denies symptoms of reflux including heartburn and vomiting.  Allergic rhinitis is reported as well controlled with no rhinorrhea, sneezing, or nasal congestion.  She continues Flonase and azelastine daily and is not currently taking an antihistamine.  She continues allergen immunotherapy with no large or local reactions.  She reports a significant decrease in her symptoms of allergic rhinitis while continuing on allergen immunotherapy.  Her current medications are listed in the chart.   Drug Allergies:  Allergies  Allergen Reactions   Bactrim Anxiety    Nervous,"cant sit still", paranoid    Physical Exam: BP (!) 142/82   Pulse 90   Temp (!) 97.3 F (36.3 C) (Temporal)   Resp 16   Ht 5' 4"  (1.626 m)   Wt 161 lb (73 kg)   SpO2 98%   BMI 27.64 kg/m    Physical Exam Vitals reviewed.  Constitutional:      Appearance: Normal appearance.  HENT:     Head: Normocephalic and atraumatic.     Right Ear: Tympanic membrane normal.     Left Ear: Tympanic membrane normal.     Nose:     Comments:  Bilateral nares slightly erythematous with clear nasal drainage noted.  Septal deviation noted.  Pharynx normal.  Ears normal.  Eyes normal.    Mouth/Throat:     Pharynx: Oropharynx is clear.  Eyes:     Conjunctiva/sclera: Conjunctivae normal.  Cardiovascular:     Rate and Rhythm: Normal rate and regular rhythm.     Heart sounds: Normal heart sounds. No murmur heard. Pulmonary:     Effort: Pulmonary effort is normal.     Breath sounds: Normal breath sounds.     Comments: Lungs clear to auscultation Musculoskeletal:        General: Normal range of motion.     Cervical back: Normal range of motion and neck supple.  Skin:    General: Skin is warm and dry.  Neurological:     Mental Status: She is alert and oriented to person, place, and time.  Psychiatric:        Mood and Affect: Mood normal.        Behavior: Behavior normal.        Thought Content: Thought content normal.        Judgment: Judgment normal.    Diagnostics: FVC 2.96, FEV1 2.27.  Predicted FVC 3.11, predicted FEV1 2.44.  Spirometry indicates normal ventilatory function  Assessment and Plan: 1. Perennial and seasonal allergic rhinitis   2. Moderate persistent asthma without complication     Meds ordered this encounter  Medications   budesonide-formoterol (SYMBICORT) 160-4.5 MCG/ACT inhaler    Sig: Inhale 2 puffs twice a day for 2 weeks or until cough and wheeze free, then stop.    Dispense:  1 each    Refill:  5   levocetirizine (XYZAL) 5 MG tablet    Sig: Take 1 tablet (5 mg total) by mouth every evening.    Dispense:  30 tablet    Refill:  5   albuterol (VENTOLIN HFA) 108 (90 Base) MCG/ACT inhaler    Sig: Inhale 2 puffs into the lungs every 4 (four) hours as needed for wheezing or shortness of breath.    Dispense:  1 each    Refill:  1   azelastine (ASTELIN) 0.1 % nasal spray    Sig: Place 2 sprays into both nostrils 2 (two) times daily. Use in each nostril as directed    Dispense:  30 mL    Refill:  4    montelukast (SINGULAIR) 10 MG tablet    Sig: Take 1 tablet (10 mg total) by mouth at bedtime.    Dispense:  90 tablet    Refill:  1    **Patient requests 90 days supply**   fluticasone (FLONASE) 50 MCG/ACT nasal spray    Sig: SHAKE LIQUID AND USE 2 SPRAYS IN EACH NOSTRIL DAILY    Dispense:  48 g    Refill:  0    **Patient requests 90 days supply**     Patient Instructions  Asthma Continue montelukast 10 mg once a day to prevent cough or wheeze Continue albuterol (red inhaler) 2 puffs once every 4 hours as needed for cough or wheeze You may use albuterol 2 puffs 5 to 15 minutes before activity to decrease cough or wheeze For asthma flare, begin Symbicort 160-2 puffs twice a day for 2 weeks or until cough and wheeze free, then stop  Allergic rhinitis Continue allergen avoidance measures directed toward grass pollen, weed pollen, dust mites, cat, dog, and mold as listed below Continue allergen immunotherapy and have access to an epinephrine autoinjector set Continue cetirizine 10 mg once a day as needed for runny nose or itch Continue Flonase 2 sprays in each nostril once a day as needed for stuffy nose Continue azelastine 2 sprays in each nostril twice a day as needed for runny nose Consider saline nasal rinses as needed for nasal symptoms. Use this before any medicated nasal sprays for best result  Your blood pressure was elevated at today's visit.  Be sure to follow-up with your primary care provider for further evaluation of your blood pressure  Call the clinic if this treatment plan is not working well for you.  Follow up in 6 months or sooner if needed.   Return in about 6 months (around 11/09/2021), or if symptoms worsen or fail to improve.    Thank you for the opportunity to care for this patient.  Please do not hesitate to contact me with questions.  Gareth Morgan, FNP [facility]

## 2021-05-16 ENCOUNTER — Ambulatory Visit (INDEPENDENT_AMBULATORY_CARE_PROVIDER_SITE_OTHER): Payer: Medicare HMO

## 2021-05-16 ENCOUNTER — Encounter (HOSPITAL_COMMUNITY): Payer: Self-pay | Admitting: Emergency Medicine

## 2021-05-16 ENCOUNTER — Other Ambulatory Visit: Payer: Self-pay

## 2021-05-16 ENCOUNTER — Ambulatory Visit (HOSPITAL_COMMUNITY)
Admission: EM | Admit: 2021-05-16 | Discharge: 2021-05-16 | Disposition: A | Discharge status: Home / Self Care | Payer: Medicare HMO | Attending: Urgent Care | Admitting: Urgent Care

## 2021-05-16 DIAGNOSIS — R059 Cough, unspecified: Secondary | ICD-10-CM | POA: Diagnosis present

## 2021-05-16 DIAGNOSIS — Z7951 Long term (current) use of inhaled steroids: Secondary | ICD-10-CM | POA: Diagnosis not present

## 2021-05-16 DIAGNOSIS — Z881 Allergy status to other antibiotic agents status: Secondary | ICD-10-CM | POA: Diagnosis not present

## 2021-05-16 DIAGNOSIS — R509 Fever, unspecified: Secondary | ICD-10-CM | POA: Diagnosis not present

## 2021-05-16 DIAGNOSIS — J3089 Other allergic rhinitis: Secondary | ICD-10-CM | POA: Insufficient documentation

## 2021-05-16 DIAGNOSIS — Z791 Long term (current) use of non-steroidal anti-inflammatories (NSAID): Secondary | ICD-10-CM | POA: Insufficient documentation

## 2021-05-16 DIAGNOSIS — J4540 Moderate persistent asthma, uncomplicated: Secondary | ICD-10-CM | POA: Diagnosis not present

## 2021-05-16 DIAGNOSIS — R0981 Nasal congestion: Secondary | ICD-10-CM | POA: Diagnosis present

## 2021-05-16 DIAGNOSIS — R0989 Other specified symptoms and signs involving the circulatory and respiratory systems: Secondary | ICD-10-CM | POA: Diagnosis present

## 2021-05-16 DIAGNOSIS — U071 COVID-19: Secondary | ICD-10-CM | POA: Insufficient documentation

## 2021-05-16 DIAGNOSIS — Z79899 Other long term (current) drug therapy: Secondary | ICD-10-CM | POA: Insufficient documentation

## 2021-05-16 LAB — SARS CORONAVIRUS 2 (TAT 6-24 HRS): SARS Coronavirus 2: POSITIVE — AB

## 2021-05-16 MED ORDER — PSEUDOEPHEDRINE HCL 60 MG PO TABS: 60.0000 mg | ORAL_TABLET | Freq: Three times a day (TID) | ORAL | 0 refills | Status: DC | PRN

## 2021-05-16 MED ORDER — PROMETHAZINE-DM 6.25-15 MG/5ML PO SYRP: 5.0000 mL | ORAL_SOLUTION | Freq: Every evening | ORAL | 0 refills | Status: DC | PRN

## 2021-05-16 MED ORDER — ACETAMINOPHEN 325 MG PO TABS
650.0000 mg | ORAL_TABLET | Freq: Once | ORAL | Status: AC
  Administered 2021-05-16: 650 mg via ORAL

## 2021-05-16 MED ORDER — ACETAMINOPHEN 325 MG PO TABS
ORAL_TABLET | ORAL | Status: AC
  Filled 2021-05-16: qty 3

## 2021-05-16 MED ORDER — BENZONATATE 100 MG PO CAPS: 100.0000 mg | ORAL_CAPSULE | Freq: Three times a day (TID) | ORAL | 0 refills | Status: DC | PRN

## 2021-05-16 NOTE — ED Notes (Signed)
Verified with provider tylenol appropriate

## 2021-05-16 NOTE — Discharge Instructions (Addendum)
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
## 2021-05-16 NOTE — ED Triage Notes (Addendum)
Sore throat, cough, dizziness, sinus and chest congestion.    Patient receives allergy injections-clinic will not give injection due to feeling unwell.    Patient wanting acid reflex medicine renewed.  Patient does not think current med is the same dose as she has been taking.  Patient is a poor historian

## 2021-05-16 NOTE — ED Notes (Signed)
Suggested treatment of fever, patient says she cant take tylenol, then said ibuprofen, followed by she doesn't know what to take for fever

## 2021-05-22 ENCOUNTER — Ambulatory Visit (INDEPENDENT_AMBULATORY_CARE_PROVIDER_SITE_OTHER): Payer: Medicare HMO

## 2021-05-22 DIAGNOSIS — J309 Allergic rhinitis, unspecified: Secondary | ICD-10-CM | POA: Diagnosis not present

## 2021-05-27 ENCOUNTER — Ambulatory Visit (INDEPENDENT_AMBULATORY_CARE_PROVIDER_SITE_OTHER): Payer: Medicare HMO

## 2021-05-27 ENCOUNTER — Ambulatory Visit (HOSPITAL_COMMUNITY)
Admission: EM | Admit: 2021-05-27 | Discharge: 2021-05-27 | Disposition: A | Discharge status: Home / Self Care | Payer: Medicare HMO | Attending: Internal Medicine | Admitting: Internal Medicine

## 2021-05-27 ENCOUNTER — Encounter (HOSPITAL_COMMUNITY): Payer: Self-pay | Admitting: *Deleted

## 2021-05-27 ENCOUNTER — Other Ambulatory Visit: Payer: Self-pay

## 2021-05-27 DIAGNOSIS — R0789 Other chest pain: Secondary | ICD-10-CM

## 2021-05-27 DIAGNOSIS — U071 COVID-19: Secondary | ICD-10-CM

## 2021-05-27 DIAGNOSIS — R079 Chest pain, unspecified: Secondary | ICD-10-CM | POA: Diagnosis not present

## 2021-05-27 DIAGNOSIS — J4540 Moderate persistent asthma, uncomplicated: Secondary | ICD-10-CM

## 2021-05-27 DIAGNOSIS — J069 Acute upper respiratory infection, unspecified: Secondary | ICD-10-CM
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
## 2021-05-27 NOTE — Discharge Instructions (Addendum)
Recommend take a medication for pain that you have at home. Since you are not sure exactly what medications you have, look at your medications at home and call your doctor in the morning to determine if there is other medications you should be taking for pain. If the pain does not improve within 24 hours or if it get worse, go to the ER ASAP for further evaluation.

## 2021-05-27 NOTE — ED Triage Notes (Signed)
Pt reports CP on Rt started last night

## 2021-06-17 ENCOUNTER — Ambulatory Visit (INDEPENDENT_AMBULATORY_CARE_PROVIDER_SITE_OTHER): Payer: Medicare HMO

## 2021-06-17 DIAGNOSIS — J309 Allergic rhinitis, unspecified: Secondary | ICD-10-CM

## 2021-06-26 ENCOUNTER — Ambulatory Visit (INDEPENDENT_AMBULATORY_CARE_PROVIDER_SITE_OTHER): Payer: Medicare HMO

## 2021-06-26 DIAGNOSIS — J309 Allergic rhinitis, unspecified: Secondary | ICD-10-CM | POA: Diagnosis not present

## 2021-07-01 ENCOUNTER — Encounter (HOSPITAL_COMMUNITY): Payer: Self-pay | Admitting: Emergency Medicine

## 2021-07-01 ENCOUNTER — Ambulatory Visit (INDEPENDENT_AMBULATORY_CARE_PROVIDER_SITE_OTHER): Payer: Medicare HMO

## 2021-07-01 ENCOUNTER — Ambulatory Visit (HOSPITAL_COMMUNITY)
Admission: EM | Admit: 2021-07-01 | Discharge: 2021-07-01 | Disposition: A | Discharge status: Home / Self Care | Payer: Medicare HMO

## 2021-07-01 DIAGNOSIS — J309 Allergic rhinitis, unspecified: Secondary | ICD-10-CM | POA: Diagnosis not present

## 2021-07-01 DIAGNOSIS — G9331 Postviral fatigue syndrome: Secondary | ICD-10-CM

## 2021-07-01 DIAGNOSIS — G933 Postviral fatigue syndrome: Secondary | ICD-10-CM

## 2021-07-01 DIAGNOSIS — E860 Dehydration: Secondary | ICD-10-CM | POA: Diagnosis not present
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
## 2021-07-01 NOTE — ED Triage Notes (Signed)
Pt presents with chills, headache, fatigue, and nausea that started this am.

## 2021-07-01 NOTE — Discharge Instructions (Addendum)
Make sure you are drinking plenty of fluids, especially water.   If you develop the worse headache of your life, worsening dizziness, nausea/vomiting, blurred vision, slurred speech, difficulty walking, weakness on one side, chest pain, shortness of breath, or altered mental status, call 911 or go directly to the Emergency Department for further evaluation.    Follow up with your primary care provider for re-evaluation.

## 2021-07-04 ENCOUNTER — Telehealth: Payer: Self-pay | Admitting: Gastroenterology

## 2021-07-04 NOTE — Telephone Encounter (Signed)
Scheduled for 08/06/21

## 2021-07-04 NOTE — Telephone Encounter (Signed)
Hi Dr. Tarri Glenn,  this pt called looking to r/s endo colon that she had to cancel last June. She stated that her crohns has gotten worse. She saw Nevin Bloodgood in April prior to scheduling procedures. Is it ok to r/s procedures or would you like to see her in clinic? Thank you.

## 2021-07-04 NOTE — Telephone Encounter (Signed)
May proceed with colonoscopy so as not to further delay her care. If she is having urgent issues, please offer her office follow-up with Nevin Bloodgood or I prior to that time. Thanks.

## 2021-07-10 ENCOUNTER — Ambulatory Visit (INDEPENDENT_AMBULATORY_CARE_PROVIDER_SITE_OTHER): Payer: Medicare HMO

## 2021-07-10 DIAGNOSIS — J309 Allergic rhinitis, unspecified: Secondary | ICD-10-CM | POA: Diagnosis not present

## 2021-07-11 ENCOUNTER — Other Ambulatory Visit: Payer: Self-pay | Admitting: Allergy & Immunology

## 2021-07-11 ENCOUNTER — Other Ambulatory Visit: Payer: Self-pay | Admitting: Orthopaedic Surgery

## 2021-07-17 ENCOUNTER — Ambulatory Visit (INDEPENDENT_AMBULATORY_CARE_PROVIDER_SITE_OTHER): Payer: Medicare HMO

## 2021-07-17 DIAGNOSIS — J309 Allergic rhinitis, unspecified: Secondary | ICD-10-CM

## 2021-07-22 ENCOUNTER — Ambulatory Visit (INDEPENDENT_AMBULATORY_CARE_PROVIDER_SITE_OTHER): Payer: Medicare HMO | Admitting: *Deleted

## 2021-07-22 DIAGNOSIS — J309 Allergic rhinitis, unspecified: Secondary | ICD-10-CM

## 2021-07-31 ENCOUNTER — Ambulatory Visit (INDEPENDENT_AMBULATORY_CARE_PROVIDER_SITE_OTHER): Payer: Medicare HMO | Admitting: *Deleted

## 2021-07-31 DIAGNOSIS — J309 Allergic rhinitis, unspecified: Secondary | ICD-10-CM

## 2021-08-06 ENCOUNTER — Encounter: Payer: Self-pay | Admitting: Gastroenterology

## 2021-08-06 ENCOUNTER — Other Ambulatory Visit (INDEPENDENT_AMBULATORY_CARE_PROVIDER_SITE_OTHER): Payer: Medicare HMO

## 2021-08-06 ENCOUNTER — Ambulatory Visit (INDEPENDENT_AMBULATORY_CARE_PROVIDER_SITE_OTHER): Payer: Medicare HMO | Admitting: Gastroenterology

## 2021-08-06 ENCOUNTER — Telehealth: Payer: Self-pay

## 2021-08-06 VITALS — BP 150/76 | HR 95 | Ht 64.0 in | Wt 151.1 lb

## 2021-08-06 DIAGNOSIS — R197 Diarrhea, unspecified: Secondary | ICD-10-CM | POA: Diagnosis not present

## 2021-08-06 DIAGNOSIS — D508 Other iron deficiency anemias: Secondary | ICD-10-CM

## 2021-08-06 LAB — CBC
HCT: 24.3 % — ABNORMAL LOW (ref 36.0–46.0)
Hemoglobin: 7 g/dL — CL (ref 12.0–15.0)
MCHC: 28.8 g/dL — ABNORMAL LOW (ref 30.0–36.0)
MCV: 55.8 fl — ABNORMAL LOW (ref 78.0–100.0)
Platelets: 600 10*3/uL — ABNORMAL HIGH (ref 150.0–400.0)
RBC: 4.36 Mil/uL (ref 3.87–5.11)
RDW: 18.9 % — ABNORMAL HIGH (ref 11.5–15.5)
WBC: 10.1 10*3/uL (ref 4.0–10.5)

## 2021-08-06 LAB — BASIC METABOLIC PANEL
BUN: 9 mg/dL (ref 6–23)
CO2: 25 mEq/L (ref 19–32)
Calcium: 9.1 mg/dL (ref 8.4–10.5)
Chloride: 106 mEq/L (ref 96–112)
Creatinine, Ser: 0.63 mg/dL (ref 0.40–1.20)
GFR: 95.31 mL/min (ref >60.00)
Glucose, Bld: 133 mg/dL — ABNORMAL HIGH (ref 70–99)
Potassium: 3.8 mEq/L (ref 3.5–5.1)
Sodium: 139 mEq/L (ref 135–145)

## 2021-08-06 LAB — IBC + FERRITIN
Ferritin: 3.1 ng/mL — ABNORMAL LOW (ref 10.0–291.0)
Iron: 5 ug/dL — ABNORMAL LOW (ref 42–145)
Saturation Ratios: 1.1 % — ABNORMAL LOW (ref 20.0–50.0)
TIBC: 453.6 ug/dL — ABNORMAL HIGH (ref 250.0–450.0)
Transferrin: 324 mg/dL (ref 212.0–360.0)

## 2021-08-06 NOTE — Progress Notes (Signed)
Records Request  Provider and/or Facility: [facility] and Dr. Earlean Shawl  Document: Signed ROI  ROI has been faxed successfully to [facility] and Dr. Earlean Shawl. Document(s) and fax confirmation(s) have been placed in the faxed file for future reference.

## 2021-08-06 NOTE — Patient Instructions (Addendum)
It was my pleasure to provide care to you today. Based on our discussion, I am providing you with my recommendations below:  RECOMMENDATION(S):   Please consider electrolyte replacement by drinking Gatorade or Pedialyte We will plan to proceed with your scheduled colonoscopy to better evaluate your symptoms  RECORDS:  We have asked you to sign a Release of Information today to obtain records from Dr. Earlean Shawl and [facility].   LABS:   Please proceed to the basement level for lab work before leaving today. Press "B" on the elevator. The lab is located at the first door on the left as you exit the elevator.  HEALTHCARE LAWS AND MY CHART RESULTS:   Due to recent changes in healthcare laws, you may see results of your imaging and/or laboratory studies on MyChart before I have had a chance to review them.  I understand that in some cases there may be results that are confusing or concerning to you. Please understand that not all results are received at same time and often I may need to interpret multiple results in order to provide you with the best plan of care or course of treatment. Therefore, I ask that you please give me 48 hours to thoroughly review all your results before contacting my office for clarification.    FOLLOW UP:  After your procedure, you will receive a call from my office staff regarding my recommendation for follow up.  BMI:  If you are age 62 or younger, your body mass index should be between 19-25. Your Body mass index is 25.94 kg/m. If this is out of the aformentioned range listed, please consider follow up with your Primary Care Provider.   MY CHART:  The [facility] providers would like to encourage you to use [facility] to communicate with providers for non-urgent requests or questions.  Due to long hold times on the telephone, sending your provider a message by [facility] may be a faster and more efficient way to get a response.  Please allow 48 business hours for a  response.  Please remember that this is for non-urgent requests.   Thank you for trusting me with your gastrointestinal care!    Thornton Park, MD, MPH

## 2021-08-06 NOTE — Telephone Encounter (Signed)
Lab called to notify of crit results. Hemoglobin 7.0 Hematocrit 24.0
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
## 2021-08-07 ENCOUNTER — Other Ambulatory Visit: Payer: Self-pay

## 2021-08-07 DIAGNOSIS — K529 Noninfective gastroenteritis and colitis, unspecified: Secondary | ICD-10-CM

## 2021-08-07 DIAGNOSIS — D508 Other iron deficiency anemias: Secondary | ICD-10-CM

## 2021-08-07 MED ORDER — PREDNISONE 10 MG PO TABS: 40.0000 mg | ORAL_TABLET | Freq: Every day | ORAL | 2 refills | Status: DC

## 2021-08-07 NOTE — Telephone Encounter (Signed)
Addressed in a previously created encounter. Refer to lab results

## 2021-08-08 ENCOUNTER — Telehealth: Payer: Self-pay | Admitting: Hematology and Oncology

## 2021-08-08 ENCOUNTER — Observation Stay (HOSPITAL_COMMUNITY)
Admission: AD | Admit: 2021-08-08 | Discharge: 2021-08-09 | Disposition: A | Discharge status: Home / Self Care | Payer: Medicare HMO | Source: Ambulatory Visit | Attending: Family Medicine | Admitting: Family Medicine

## 2021-08-08 DIAGNOSIS — Z79899 Other long term (current) drug therapy: Secondary | ICD-10-CM | POA: Insufficient documentation

## 2021-08-08 DIAGNOSIS — K529 Noninfective gastroenteritis and colitis, unspecified: Secondary | ICD-10-CM

## 2021-08-08 DIAGNOSIS — D649 Anemia, unspecified: Secondary | ICD-10-CM | POA: Diagnosis not present

## 2021-08-08 DIAGNOSIS — K639 Disease of intestine, unspecified: Secondary | ICD-10-CM | POA: Diagnosis not present

## 2021-08-08 DIAGNOSIS — D500 Iron deficiency anemia secondary to blood loss (chronic): Principal | ICD-10-CM | POA: Insufficient documentation

## 2021-08-08 DIAGNOSIS — J4540 Moderate persistent asthma, uncomplicated: Secondary | ICD-10-CM | POA: Insufficient documentation

## 2021-08-08 MED ORDER — PANTOPRAZOLE SODIUM 40 MG PO TBEC
40.0000 mg | DELAYED_RELEASE_TABLET | Freq: Every day | ORAL | Status: DC
  Administered 2021-08-09: 40 mg via ORAL
  Filled 2021-08-08: qty 1

## 2021-08-08 MED ORDER — ONDANSETRON HCL 4 MG/2ML IJ SOLN: 4.0000 mg | Freq: Four times a day (QID) | INTRAMUSCULAR | Status: DC | PRN

## 2021-08-08 MED ORDER — ONDANSETRON HCL 4 MG PO TABS: 4.0000 mg | ORAL_TABLET | Freq: Four times a day (QID) | ORAL | Status: DC | PRN

## 2021-08-08 MED ORDER — ACETAMINOPHEN 325 MG PO TABS
650.0000 mg | ORAL_TABLET | Freq: Four times a day (QID) | ORAL | Status: DC | PRN
  Filled 2021-08-08: qty 2

## 2021-08-08 MED ORDER — HYDROCORTISONE 2.5 % EX OINT: 1.0000 "application " | TOPICAL_OINTMENT | Freq: Two times a day (BID) | CUTANEOUS | Status: DC

## 2021-08-08 MED ORDER — MONTELUKAST SODIUM 10 MG PO TABS: 10.0000 mg | ORAL_TABLET | Freq: Every day | ORAL | Status: DC

## 2021-08-08 MED ORDER — PREDNISONE 20 MG PO TABS
40.0000 mg | ORAL_TABLET | Freq: Every day | ORAL | Status: DC
  Administered 2021-08-09: 40 mg via ORAL
  Filled 2021-08-08: qty 2

## 2021-08-08 MED ORDER — SODIUM CHLORIDE 0.9% IV SOLUTION: Freq: Once | INTRAVENOUS | Status: AC

## 2021-08-08 MED ORDER — DIPHENHYDRAMINE HCL 25 MG PO CAPS
25.0000 mg | ORAL_CAPSULE | Freq: Once | ORAL | Status: AC
  Administered 2021-08-09: 25 mg via ORAL
  Filled 2021-08-08: qty 1

## 2021-08-08 MED ORDER — DOXYCYCLINE HYCLATE 100 MG PO TABS
100.0000 mg | ORAL_TABLET | Freq: Two times a day (BID) | ORAL | Status: DC
  Administered 2021-08-09 (×2): 100 mg via ORAL
  Filled 2021-08-08 (×2): qty 1

## 2021-08-08 MED ORDER — CELECOXIB 100 MG PO CAPS
100.0000 mg | ORAL_CAPSULE | Freq: Every day | ORAL | Status: DC
  Administered 2021-08-09: 100 mg via ORAL
  Filled 2021-08-08: qty 1

## 2021-08-08 MED ORDER — ACETAMINOPHEN 325 MG PO TABS
650.0000 mg | ORAL_TABLET | Freq: Once | ORAL | Status: AC
  Administered 2021-08-09: 650 mg via ORAL

## 2021-08-08 MED ORDER — MOMETASONE FURO-FORMOTEROL FUM 200-5 MCG/ACT IN AERO
2.0000 | INHALATION_SPRAY | Freq: Two times a day (BID) | RESPIRATORY_TRACT | Status: DC
  Administered 2021-08-09: 2 via RESPIRATORY_TRACT
  Filled 2021-08-08: qty 8.8

## 2021-08-08 MED ORDER — ALBUTEROL SULFATE HFA 108 (90 BASE) MCG/ACT IN AERS
2.0000 | INHALATION_SPRAY | RESPIRATORY_TRACT | Status: DC | PRN
  Filled 2021-08-08: qty 6.7

## 2021-08-08 MED ORDER — ACETAMINOPHEN 650 MG RE SUPP: 650.0000 mg | Freq: Four times a day (QID) | RECTAL | Status: DC | PRN

## 2021-08-08 NOTE — Progress Notes (Signed)
Nurse manager returned by call. Long wait through ER. Not recommended. I am waiting for a call from the hospitalist to arrange for a direct admission when a bed is available. However, there is a long list of patients waiting for direct admission beds, as well. Will update when additional information is available.

## 2021-08-08 NOTE — Telephone Encounter (Signed)
Scheduled appt per 10/5 referral. Pt is aware of appt date and time.
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
## 2021-08-09 ENCOUNTER — Encounter (HOSPITAL_COMMUNITY): Payer: Self-pay | Admitting: Family Medicine

## 2021-08-09 ENCOUNTER — Other Ambulatory Visit: Payer: Self-pay

## 2021-08-09 DIAGNOSIS — D649 Anemia, unspecified: Secondary | ICD-10-CM | POA: Diagnosis not present

## 2021-08-09 DIAGNOSIS — D500 Iron deficiency anemia secondary to blood loss (chronic): Secondary | ICD-10-CM | POA: Diagnosis not present

## 2021-08-09 LAB — HEMOGLOBIN AND HEMATOCRIT, BLOOD
HCT: 36.9 % (ref 36.0–46.0)
Hemoglobin: 10.8 g/dL — ABNORMAL LOW (ref 12.0–15.0)

## 2021-08-09 LAB — BASIC METABOLIC PANEL
Anion gap: 4 — ABNORMAL LOW (ref 5–15)
BUN: 10 mg/dL (ref 8–23)
CO2: 24 mmol/L (ref 22–32)
Calcium: 9.2 mg/dL (ref 8.9–10.3)
Chloride: 111 mmol/L (ref 98–111)
Creatinine, Ser: 0.7 mg/dL (ref 0.44–1.00)
GFR, Estimated: >60 mL/min (ref >60)
Glucose, Bld: 148 mg/dL — ABNORMAL HIGH (ref 70–99)
Potassium: 3.8 mmol/L (ref 3.5–5.1)
Sodium: 139 mmol/L (ref 135–145)

## 2021-08-09 LAB — PREPARE RBC (CROSSMATCH)

## 2021-08-09 LAB — ABO/RH: ABO/RH(D): O POS

## 2021-08-09 LAB — HIV ANTIBODY (ROUTINE TESTING W REFLEX): HIV Screen 4th Generation wRfx: NONREACTIVE

## 2021-08-09 MED ORDER — ACETAMINOPHEN 325 MG PO TABS: 650.0000 mg | ORAL_TABLET | Freq: Four times a day (QID) | ORAL | 0 refills | Status: DC | PRN

## 2021-08-09 MED ORDER — CLOTRIMAZOLE 1 % EX CREA
TOPICAL_CREAM | Freq: Two times a day (BID) | CUTANEOUS | Status: DC
  Filled 2021-08-09: qty 15

## 2021-08-09 MED ORDER — CLOTRIMAZOLE 1 % EX CREA: TOPICAL_CREAM | Freq: Two times a day (BID) | CUTANEOUS | 0 refills | Status: DC

## 2021-08-09 NOTE — Discharge Summary (Signed)
Discharge Summary  Sheila Vincent [id number] DOB: 11-09-1960  PCP: Elwyn Reach, MD  Admit date: 08/08/2021 Discharge date: 08/09/2021  Time spent: Less than 30 minutes  Recommendations for Outpatient Follow-up:  Home  Discharge Diagnoses:  Active Hospital Problems   Diagnosis Date Noted   Symptomatic anemia 08/08/2021   IBD (inflammatory bowel disease) 08/08/2021   Iron deficiency anemia due to chronic blood loss 08/08/2021   Moderate persistent asthma 07/14/2015    Resolved Hospital Problems  No resolved problems to display.    Discharge Condition: Improved  Diet recommendation: Regular  Vitals:   08/09/21 0512 08/09/21 0749  BP: (!) 152/75 (!) 152/94  Pulse: 82 79  Resp: 16 18  Temp: 98.2 F (36.8 C) 97.9 F (36.6 C)  SpO2:  99%    History of present illness:  60 year old female female with medical history significant for asthma, inflammatory bowel disease, GERD and chronic back pain who was admitted to the hospital directly from the GI clinic because of blood anemia needing blood transfusion.  Patient stated that she was diagnosed with Crohn's disease at the age of 68 and has been doing well on 04 October 2020.  She was seen at the GI clinic because she was having increased fatigue and lightheadedness and GI clinic did hemoglobin and found it to be 7.  She has an endoscopy scheduled as outpatient already  Hospital Course:  Principal Problem:   Symptomatic anemia Active Problems:   Moderate persistent asthma   IBD (inflammatory bowel disease)   Iron deficiency anemia due to chronic blood loss 60 year old female female with medical history significant for asthma, inflammatory bowel disease, GERD and chronic back pain who was admitted to the hospital directly from the GI clinic because of blood anemia needing blood transfusion.  Patient stated that she was diagnosed with Crohn's disease at the age of 55 and has been doing well on 04 October 2020.  She was seen  at the GI clinic because she was having increased fatigue and lightheadedness and GI clinic did hemoglobin and found it to be 7.  She has an endoscopy scheduled as outpatient already Patient received 2 units of packed RBC with improvement in her hemoglobin.  Her asthma was stable during his hospital stay she was continued on her ICS/LAMA LABA Singulair.  Procedures: Blood transfusionX2  Consultations: Gastroenterology  Discharge Exam: BP (!) 152/94   Pulse 79   Temp 97.9 F (36.6 C) (Oral)   Resp 18   Wt 67.1 kg   SpO2 99%   BMI 25.39 kg/m   General: Alert oriented pleasant no distress Cardiovascular: Rate and rhythm Respiratory: Clear to auscultation bilaterally  Discharge Instructions You were cared for by a hospitalist during your hospital stay. If you have any questions about your discharge medications or the care you received while you were in the hospital after you are discharged, you can call the unit and asked to speak with the hospitalist on call if the hospitalist that took care of you is not available. Once you are discharged, your primary care physician will handle any further medical issues. Please note that NO REFILLS for any discharge medications will be authorized once you are discharged, as it is imperative that you return to your primary care physician (or establish a relationship with a primary care physician if you do not have one) for your aftercare needs so that they can reassess your need for medications and monitor your lab values.  Discharge Instructions  Call MD for:  extreme fatigue   Complete by: As directed    Call MD for:  persistant dizziness or light-headedness   Complete by: As directed    Call MD for:  severe uncontrolled pain   Complete by: As directed    Diet - low sodium heart healthy   Complete by: As directed    Discharge instructions   Complete by: As directed    F/u GI for endoscopy on 08/13/2021   Increase activity slowly   Complete  by: As directed       Allergies as of 08/09/2021       Reactions   Bactrim Anxiety   Nervous,"cant sit still", paranoid        Medication List     STOP taking these medications    celecoxib 100 MG capsule Commonly known as: CELEBREX   nabumetone 750 MG tablet Commonly known as: RELAFEN   pseudoephedrine 60 MG tablet Commonly known as: SUDAFED       TAKE these medications    acetaminophen 325 MG tablet Commonly known as: TYLENOL Take 2 tablets (650 mg total) by mouth every 6 (six) hours as needed for mild pain (or Fever >/= 101).   albuterol 108 (90 Base) MCG/ACT inhaler Commonly known as: VENTOLIN HFA Inhale 2 puffs into the lungs every 4 (four) hours as needed for wheezing or shortness of breath.   azelastine 0.1 % nasal spray Commonly known as: ASTELIN Place 2 sprays into both nostrils 2 (two) times daily. Use in each nostril as directed What changed: additional instructions   budesonide-formoterol 160-4.5 MCG/ACT inhaler Commonly known as: Symbicort Inhale 2 puffs twice a day for 2 weeks or until cough and wheeze free, then stop. What changed:  how much to take how to take this when to take this additional instructions   cetirizine 10 MG tablet Commonly known as: ZYRTEC Take 10 mg by mouth daily.   clotrimazole 1 % cream Commonly known as: LOTRIMIN Apply topically 2 (two) times daily.   doxycycline 100 MG tablet Commonly known as: VIBRA-TABS Take 100 mg by mouth 2 (two) times daily. Start date: 08/02/21   fluticasone 50 MCG/ACT nasal spray Commonly known as: FLONASE SHAKE LIQUID AND USE 2 SPRAYS IN EACH NOSTRIL DAILY What changed:  how much to take how to take this when to take this additional instructions   hydrocortisone 2.5 % ointment Apply 1 application topically 2 (two) times daily.   montelukast 10 MG tablet Commonly known as: SINGULAIR Take 1 tablet (10 mg total) by mouth at bedtime. What changed: when to take this    omeprazole 20 MG capsule Commonly known as: PRILOSEC TAKE 1 CAPSULE(20 MG) BY MOUTH TWICE DAILY AS NEEDED What changed: See the new instructions.   predniSONE 10 MG tablet Commonly known as: DELTASONE Take 4 tablets (40 mg total) by mouth daily with breakfast.   tiZANidine 4 MG tablet Commonly known as: Zanaflex Take 1 tablet (4 mg total) by mouth every 6 (six) hours as needed for muscle spasms.       Allergies  Allergen Reactions   Bactrim Anxiety    Nervous,"cant sit still", paranoid      The results of significant diagnostics from this hospitalization (including imaging, microbiology, ancillary and laboratory) are listed below for reference.    Significant Diagnostic Studies: No results found.  Microbiology: No results found for this or any previous visit (from the past 240 hour(s)).   Labs: Basic Metabolic Panel: Recent Labs  Lab 08/06/21 1619 08/09/21 0030  NA 139 139  K 3.8 3.8  CL 106 111  CO2 25 24  GLUCOSE 133* 148*  BUN 9 10  CREATININE 0.63 0.70  CALCIUM 9.1 9.2   Liver Function Tests: No results for input(s): AST, ALT, ALKPHOS, BILITOT, PROT, ALBUMIN in the last 168 hours. No results for input(s): LIPASE, AMYLASE in the last 168 hours. No results for input(s): AMMONIA in the last 168 hours. CBC: Recent Labs  Lab 08/06/21 1619 08/09/21 0948  WBC 10.1  --   HGB 7.0 Repeated and verified X2.* 10.8*  HCT 24.3* 36.9  MCV 55.8*  --   PLT 600.0*  --    Cardiac Enzymes: No results for input(s): CKTOTAL, CKMB, CKMBINDEX, TROPONINI in the last 168 hours. BNP: BNP (last 3 results) No results for input(s): BNP in the last 8760 hours.  ProBNP (last 3 results) No results for input(s): PROBNP in the last 8760 hours.  CBG: No results for input(s): GLUCAP in the last 168 hours.     Signed:  Cristal Deer, MD [facility] 08/09/2021, 1:11 PM

## 2021-08-09 NOTE — Progress Notes (Signed)
Patient was discharged to home, AVS was reviewed and all questions answered. Patient is aware of appointment for colonoscopy and she confirmed new prescription was sent to the correct pharmacy. She will contact prescribing provider regarding resuming celebrex. She confirmed she had all of her belongings. NT assisted her to the exit, patient provided her own transportation.

## 2021-08-12 DIAGNOSIS — Z9289 Personal history of other medical treatment: Secondary | ICD-10-CM

## 2021-08-12 DIAGNOSIS — Z5189 Encounter for other specified aftercare: Secondary | ICD-10-CM

## 2021-08-12 HISTORY — DX: Personal history of other medical treatment: Z92.89

## 2021-08-12 HISTORY — DX: Encounter for other specified aftercare: Z51.89

## 2021-08-12 LAB — BPAM RBC
Blood Product Expiration Date: 202211052359
Blood Product Expiration Date: 202211102359
ISSUE DATE / TIME: 202210070128
ISSUE DATE / TIME: 202210070450
Unit Type and Rh: 5100
Unit Type and Rh: 5100

## 2021-08-12 LAB — TYPE AND SCREEN
ABO/RH(D): O POS
Antibody Screen: NEGATIVE
Unit division: 0
Unit division: 0

## 2021-08-13 ENCOUNTER — Ambulatory Visit (AMBULATORY_SURGERY_CENTER): Payer: Medicare HMO | Admitting: Gastroenterology

## 2021-08-13 ENCOUNTER — Other Ambulatory Visit: Payer: Self-pay | Admitting: Gastroenterology

## 2021-08-13 ENCOUNTER — Telehealth: Payer: Self-pay | Admitting: Gastroenterology

## 2021-08-13 ENCOUNTER — Other Ambulatory Visit: Payer: Self-pay

## 2021-08-13 ENCOUNTER — Encounter: Payer: Self-pay | Admitting: Gastroenterology

## 2021-08-13 VITALS — BP 127/65 | HR 81 | Temp 97.8°F | Resp 11

## 2021-08-13 DIAGNOSIS — K529 Noninfective gastroenteritis and colitis, unspecified: Secondary | ICD-10-CM

## 2021-08-13 DIAGNOSIS — K5289 Other specified noninfective gastroenteritis and colitis: Secondary | ICD-10-CM | POA: Diagnosis not present

## 2021-08-13 DIAGNOSIS — D509 Iron deficiency anemia, unspecified: Secondary | ICD-10-CM | POA: Diagnosis not present

## 2021-08-13 DIAGNOSIS — K2970 Gastritis, unspecified, without bleeding: Secondary | ICD-10-CM | POA: Diagnosis not present

## 2021-08-13 DIAGNOSIS — R197 Diarrhea, unspecified: Secondary | ICD-10-CM

## 2021-08-13 DIAGNOSIS — K2950 Unspecified chronic gastritis without bleeding: Secondary | ICD-10-CM

## 2021-08-13 HISTORY — PX: UPPER GI ENDOSCOPY: SHX6162

## 2021-08-13 HISTORY — PX: OTHER SURGICAL HISTORY: SHX169

## 2021-08-13 MED ORDER — SODIUM CHLORIDE 0.9 % IV SOLN: 500.0000 mL | Freq: Once | INTRAVENOUS | Status: DC

## 2021-08-13 NOTE — Progress Notes (Signed)
DT - VS  Lower right tooth is a little loose pt reported.

## 2021-08-13 NOTE — Progress Notes (Signed)
Dr. Tarri Glenn in to speak with pt in admitting.  Dr. Tarri Glenn explained to pt that the reason she wanted pt to have the EGD was because of her crohn's, IDA and recent blood transfusions.  Pt was concerned about if the EGD would cause her throat to swell and that she could not eat or drink today.  Dr. Tarri Glenn explained that the flexible tube was thin as her pinky and coated with lub.  Dr. Tarri Glenn told pt she was not trying to talk her into having EGD today.  She just explained why the pt needed to have the procedure.  Pt was given time to process their discussion and she decided to go forward with colon and EGD.  Pt signed consent for Colonoscopy and EGD.  Dr. Tarri Glenn and the procedure room were made aware of pt's decision.

## 2021-08-13 NOTE — Progress Notes (Signed)
1530 Robinul 0.1 mg IV given due large amount of secretions upon assessment.  MD made aware, vss. Data will not transfer from monitor, vs being posted manually.

## 2021-08-13 NOTE — Telephone Encounter (Signed)
Pls call pt. she is scheduled for proc today at 3:00pm but states that prep yesterday did not work, and the second part of prep this morning, she vomited it.

## 2021-08-13 NOTE — Telephone Encounter (Signed)
Spoke with patient. Pt stated that she got the first part of prep down, and only at a couple Bowel Movements last night. This morning she drank all the prep and vomited shortly after. Pt stated that last BM is liquid and brown at 1145. After speaking to MD, pt was advised to proceed with colonoscopy this afternoon with possible enema upon arrival or pt can reschedule at her convenience. Spoke with patient again and she stated that her BM was clearing up, and wanted to proceed with colonoscopy today. Pt had no further concerns at the end of the call.

## 2021-08-13 NOTE — Patient Instructions (Signed)
START Prednisone TODAY  YOU HAD AN ENDOSCOPIC PROCEDURE TODAY AT [facility]:   Refer to the procedure report that was given to you for any specific questions about what was found during the examination.  If the procedure report does not answer your questions, please call your gastroenterologist to clarify.  If you requested that your care partner not be given the details of your procedure findings, then the procedure report has been included in a sealed envelope for you to review at your convenience later.  YOU SHOULD EXPECT: Some feelings of bloating in the abdomen. Passage of more gas than usual.  Walking can help get rid of the air that was put into your GI tract during the procedure and reduce the bloating. If you had a lower endoscopy (such as a colonoscopy or flexible sigmoidoscopy) you may notice spotting of blood in your stool or on the toilet paper. If you underwent a bowel prep for your procedure, you may not have a normal bowel movement for a few days.  Please Note:  You might notice some irritation and congestion in your nose or some drainage.  This is from the oxygen used during your procedure.  There is no need for concern and it should clear up in a day or so.  SYMPTOMS TO REPORT IMMEDIATELY:  Following lower endoscopy (colonoscopy or flexible sigmoidoscopy):  Excessive amounts of blood in the stool  Significant tenderness or worsening of abdominal pains  Swelling of the abdomen that is new, acute  Fever of 100F or higher  Following upper endoscopy (EGD)  Vomiting of blood or coffee ground material  New chest pain or pain under the shoulder blades  Painful or persistently difficult swallowing  New shortness of breath  Fever of 100F or higher  Black, tarry-looking stools  For urgent or emergent issues, a gastroenterologist can be reached at any hour by calling [phone]. Do not use MyChart messaging for urgent concerns.    DIET:  We do recommend a  small meal at first, but then you may proceed to your regular diet.  Drink plenty of fluids but you should avoid alcoholic beverages for 24 hours.  ACTIVITY:  You should plan to take it easy for the rest of today and you should NOT DRIVE or use heavy machinery until tomorrow (because of the sedation medicines used during the test).    FOLLOW UP: Our staff will call the number listed on your records 48-72 hours following your procedure to check on you and address any questions or concerns that you may have regarding the information given to you following your procedure. If we do not reach you, we will leave a message.  We will attempt to reach you two times.  During this call, we will ask if you have developed any symptoms of COVID 19. If you develop any symptoms (ie: fever, flu-like symptoms, shortness of breath, cough etc.) before then, please call [phone].  If you test positive for Covid 19 in the 2 weeks post procedure, please call and report this information to [place].    If any biopsies were taken you will be contacted by phone or by letter within the next 1-3 weeks.  Please call us at [phone] if you have not heard about the biopsies in 3 weeks.    SIGNATURES/CONFIDENTIALITY: You and/or your care partner have signed paperwork which will be entered into your electronic medical record.  These signatures attest to the fact that that the information  above on your After Visit Summary has been reviewed and is understood.  Full responsibility of the confidentiality of this discharge information lies with you and/or your care-partner.

## 2021-08-13 NOTE — Op Note (Signed)
[place] Patient Name: Sheila Vincent Procedure Date: 08/13/2021 3:21 PM MRN: [id number] Endoscopist: Thornton Park MD, MD Age: 60 Referring MD:  Date of Birth: Mar 18, 1961 Gender: Female Account #: [id number] Procedure:                Upper GI endoscopy Indications:              Unexplained iron deficiency anemia, Diarrhea Medicines:                Monitored Anesthesia Care Procedure:                Pre-Anesthesia Assessment:                           - Prior to the procedure, a History and Physical                            was performed, and patient medications and                            allergies were reviewed. The patient's tolerance of                            previous anesthesia was also reviewed. The risks                            and benefits of the procedure and the sedation                            options and risks were discussed with the patient.                            All questions were answered, and informed consent                            was obtained. Prior Anticoagulants: The patient has                            taken no previous anticoagulant or antiplatelet                            agents. ASA Grade Assessment: III - A patient with                            severe systemic disease. After reviewing the risks                            and benefits, the patient was deemed in                            satisfactory condition to undergo the procedure.                           After obtaining informed consent, the endoscope was  passed under direct vision. Throughout the                            procedure, the patient's blood pressure, pulse, and                            oxygen saturations were monitored continuously. The                            Endoscope was introduced through the mouth, and                            advanced to the third part of duodenum. The upper                            GI  endoscopy was accomplished without difficulty.                            The patient tolerated the procedure well. Scope In: Scope Out: Findings:                 The examined esophagus was normal.                           The entire examined stomach was normal. Biopsies                            were taken from the antrum, body, and fundus with a                            cold forceps for histology. Estimated blood loss                            was minimal.                           Diffuse mild mucosal changes characterized by                            friability (with contact bleeding) were found in                            the duodenal bulb. Biopsies were taken with a cold                            forceps for histology. Estimated blood loss was                            minimal.                           The cardia and gastric fundus were normal on                            retroflexion.  The exam was otherwise without abnormality. Complications:            No immediate complications. Estimated blood loss:                            Minimal. Estimated Blood Loss:     Estimated blood loss was minimal. Impression:               - Normal esophagus.                           - Normal stomach. Biopsied.                           - Mucosal changes in the duodenum. Biopsied.                           - The examination was otherwise normal. Recommendation:           - Patient has a contact number available for                            emergencies. The signs and symptoms of potential                            delayed complications were discussed with the                            patient. Return to normal activities tomorrow.                            Written discharge instructions were provided to the                            patient.                           - Resume previous diet.                           - Continue present medications.                            - Await pathology results.                           - Proceed with colonoscopy as previously planned. Thornton Park MD, MD 08/13/2021 3:58:22 PM This report has been signed electronically.

## 2021-08-13 NOTE — Progress Notes (Signed)
Received notification from [facility] that this pt does not have any records that can be located within their practice re: this pt having had any procedure or OV's. Notification given to Dr. Tarri Glenn for her review.  Records Request - SECOND REQUEST  Provider and/or Facility: Dr. Earlean Shawl  Document: Signed ROI  ROI has been faxed successfully to Dr. Earlean Shawl. Document(s) and fax confirmation(s) have been placed in the faxed file for future reference.
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
## 2021-08-13 NOTE — Op Note (Addendum)
[place] Patient Name: Sheila Vincent Procedure Date: 08/13/2021 3:21 PM MRN: [id number] Endoscopist: Thornton Park MD, MD Age: 60 Referring MD:  Date of Birth: 01-17-1961 Gender: Female Account #: [id number] Procedure:                Colonoscopy Indications:              Chronic diarrhea, Unexplained iron deficiency anemia Medicines:                Monitored Anesthesia Care Procedure:                Pre-Anesthesia Assessment:                           - Prior to the procedure, a History and Physical                            was performed, and patient medications and                            allergies were reviewed. The patient's tolerance of                            previous anesthesia was also reviewed. The risks                            and benefits of the procedure and the sedation                            options and risks were discussed with the patient.                            All questions were answered, and informed consent                            was obtained. Prior Anticoagulants: The patient has                            taken no previous anticoagulant or antiplatelet                            agents. ASA Grade Assessment: III - A patient with                            severe systemic disease. After reviewing the risks                            and benefits, the patient was deemed in                            satisfactory condition to undergo the procedure.                           After obtaining informed consent, the colonoscope  was passed under direct vision. Throughout the                            procedure, the patient's blood pressure, pulse, and                            oxygen saturations were monitored continuously. The                            Olympus CF-HQ190L [id number]) Colonoscope was                            introduced through the anus and advanced to the 14                            cm  into the ileum. The colonoscopy was performed                            without difficulty. The patient tolerated the                            procedure well. The quality of the bowel                            preparation was good. The terminal ileum, ileocecal                            valve, appendiceal orifice, and rectum were                            photographed. Scope In: 3:43:39 PM Scope Out: 3:55:25 PM Scope Withdrawal Time: 0 hours 9 minutes 3 seconds  Total Procedure Duration: 0 hours 11 minutes 46 seconds  Findings:                 The perianal and digital rectal examinations were                            normal.                           Diffuse moderate inflammation characterized by                            erythema, friability, and granularity were present                            continuously throughout the colon. There were also                            deep ulcerations was found in the distal sigmoid                            colon and rectum consistent with severe colitis.  Biopsies were taken with a cold forceps for                            histology. Estimated blood loss was minimal.                           The terminal ileum appeared normal.                           The exam was otherwise without abnormality on                            direct and retroflexion views. Complications:            No immediate complications. Estimated blood loss:                            Minimal. Estimated Blood Loss:     Estimated blood loss was minimal. Impression:               - Diffuse moderate inflammation was found in the                            entire examined colon secondary to pancolitis.                            Biopsied.                           - The examined portion of the ileum was normal.                           - The examination was otherwise normal on direct                            and retroflexion  views. Recommendation:           - Patient has a contact number available for                            emergencies. The signs and symptoms of potential                            delayed complications were discussed with the                            patient. Return to normal activities tomorrow.                            Written discharge instructions were provided to the                            patient.                           - Resume previous diet.                           -  Start prednisone 40 mg daily. (Prescribed last                            week but she has not yet started to take it.)                            Reduce by 5 mg daily every week.                           - Await pathology results.                           - Office follow-up in 1-2 weeks. Thornton Park MD, MD 08/13/2021 4:05:11 PM This report has been signed electronically.

## 2021-08-14 ENCOUNTER — Telehealth: Payer: Self-pay

## 2021-08-14 ENCOUNTER — Inpatient Hospital Stay: Payer: Medicare HMO | Attending: Hematology and Oncology | Admitting: Hematology and Oncology

## 2021-08-14 ENCOUNTER — Inpatient Hospital Stay: Payer: Medicare HMO

## 2021-08-14 VITALS — BP 138/68 | HR 99 | Temp 98.2°F | Resp 18 | Wt 151.4 lb

## 2021-08-14 DIAGNOSIS — D500 Iron deficiency anemia secondary to blood loss (chronic): Secondary | ICD-10-CM | POA: Diagnosis present

## 2021-08-14 DIAGNOSIS — I10 Essential (primary) hypertension: Secondary | ICD-10-CM

## 2021-08-14 DIAGNOSIS — K5090 Crohn's disease, unspecified, without complications: Secondary | ICD-10-CM | POA: Diagnosis not present

## 2021-08-14 LAB — CMP (CANCER CENTER ONLY)
ALT: 17 U/L (ref 0–44)
AST: 18 U/L (ref 15–41)
Albumin: 4 g/dL (ref 3.5–5.0)
Alkaline Phosphatase: 107 U/L (ref 38–126)
Anion gap: 8 (ref 5–15)
BUN: 11 mg/dL (ref 8–23)
CO2: 24 mmol/L (ref 22–32)
Calcium: 9.7 mg/dL (ref 8.9–10.3)
Chloride: 111 mmol/L (ref 98–111)
Creatinine: 0.68 mg/dL (ref 0.44–1.00)
GFR, Estimated: >60 mL/min (ref >60)
Glucose, Bld: 182 mg/dL — ABNORMAL HIGH (ref 70–99)
Potassium: 3.8 mmol/L (ref 3.5–5.1)
Sodium: 143 mmol/L (ref 135–145)
Total Bilirubin: 0.4 mg/dL (ref 0.3–1.2)
Total Protein: 8.2 g/dL — ABNORMAL HIGH (ref 6.5–8.1)

## 2021-08-14 LAB — CBC WITH DIFFERENTIAL (CANCER CENTER ONLY)
Abs Immature Granulocytes: 0.08 10*3/uL — ABNORMAL HIGH (ref 0.00–0.07)
Basophils Absolute: 0 10*3/uL (ref 0.0–0.1)
Basophils Relative: 0 %
Eosinophils Absolute: 0 10*3/uL (ref 0.0–0.5)
Eosinophils Relative: 0 %
HCT: 37.9 % (ref 36.0–46.0)
Hemoglobin: 10.6 g/dL — ABNORMAL LOW (ref 12.0–15.0)
Immature Granulocytes: 1 %
Lymphocytes Relative: 10 %
Lymphs Abs: 1.5 10*3/uL (ref 0.7–4.0)
MCH: 19.1 pg — ABNORMAL LOW (ref 26.0–34.0)
MCHC: 28 g/dL — ABNORMAL LOW (ref 30.0–36.0)
MCV: 68.4 fL — ABNORMAL LOW (ref 80.0–100.0)
Monocytes Absolute: 1 10*3/uL (ref 0.1–1.0)
Monocytes Relative: 7 %
Neutro Abs: 11.6 10*3/uL — ABNORMAL HIGH (ref 1.7–7.7)
Neutrophils Relative %: 82 %
Platelet Count: 640 10*3/uL — ABNORMAL HIGH (ref 150–400)
RBC: 5.54 MIL/uL — ABNORMAL HIGH (ref 3.87–5.11)
RDW: 25.3 % — ABNORMAL HIGH (ref 11.5–15.5)
WBC Count: 14.3 10*3/uL — ABNORMAL HIGH (ref 4.0–10.5)
nRBC: 0 % (ref 0.0–0.2)

## 2021-08-14 LAB — RETIC PANEL
Immature Retic Fract: 14.2 % (ref 2.3–15.9)
RBC.: 5.41 MIL/uL — ABNORMAL HIGH (ref 3.87–5.11)
Retic Ct Pct: <0.4 % — ABNORMAL LOW (ref 0.4–3.1)
Reticulocyte Hemoglobin: 24.3 pg — ABNORMAL LOW (ref >27.9)

## 2021-08-14 LAB — IRON AND TIBC
Iron: 23 ug/dL — ABNORMAL LOW (ref 28–170)
Saturation Ratios: 5 % — ABNORMAL LOW (ref 10.4–31.8)
TIBC: 476 ug/dL — ABNORMAL HIGH (ref 250–450)
UIBC: 453 ug/dL

## 2021-08-14 LAB — FOLATE: Folate: 14.7 ng/mL (ref >5.9)

## 2021-08-14 LAB — VITAMIN B12: Vitamin B-12: 186 pg/mL (ref 180–914)

## 2021-08-14 LAB — FERRITIN: Ferritin: 8 ng/mL — ABNORMAL LOW (ref 11–307)

## 2021-08-14 NOTE — Telephone Encounter (Signed)
Please call patient and have her call her GI physician who did the scope regarding the burning. She most likely has some irritation from the procedure.   Thank you.
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
## 2021-08-14 NOTE — Telephone Encounter (Signed)
Patient is aware of upcoming appointment made.

## 2021-08-14 NOTE — Telephone Encounter (Signed)
Spoke to patient and informed her to call the GI physician who completed the procedure and let us know the outcome.

## 2021-08-14 NOTE — Telephone Encounter (Signed)
Pt had an upper endoscopy yesterday and since her acid reflux has been horrible with burning in throat. She is doing the omeprzole and it is not helping with the burning in the throat. Please advise to something else that might help?

## 2021-08-14 NOTE — Telephone Encounter (Signed)
Left a message for patient to call out office back.

## 2021-08-14 NOTE — Telephone Encounter (Signed)
Per Dr. Tarri Glenn, pt requires a follow up appt with [facility] or her within 1-2 weeks d/t colitis s/p colonoscopy 08/13/21. Pt has been scheduled as follows:  Appointment Information  Name: Sheila Vincent, Sheila Vincent" MRN: [id number]  Date: 09/03/2021 Status: Sch  Time: 10:20 AM Length: 20  Visit Type: FOLLOW UP 20 [336] Copay: $0.00  Provider: Noralyn Pick, NP Department: [facility]  Referring Provider: Elwyn Reach CSN: [id number]  Notes: f/u colitis per Dr. Colin Benton  Made On: 08/14/2021 10:14 AM By: Hardie Pulley [name]   Appt reminder mailed. Pt has been called to make her aware of above appt. LVM requesting returned call.

## 2021-08-15 ENCOUNTER — Other Ambulatory Visit: Payer: Self-pay

## 2021-08-15 ENCOUNTER — Telehealth: Payer: Self-pay | Admitting: *Deleted

## 2021-08-15 ENCOUNTER — Telehealth: Payer: Self-pay | Admitting: Gastroenterology

## 2021-08-15 ENCOUNTER — Telehealth: Payer: Self-pay

## 2021-08-15 DIAGNOSIS — R197 Diarrhea, unspecified: Secondary | ICD-10-CM

## 2021-08-15 DIAGNOSIS — K51011 Ulcerative (chronic) pancolitis with rectal bleeding: Secondary | ICD-10-CM

## 2021-08-15 LAB — CALPROTECTIN, FECAL: Calprotectin, Fecal: 1114 ug/g — ABNORMAL HIGH (ref 0–120)

## 2021-08-15 NOTE — Telephone Encounter (Signed)
NO ANSWER, MESSAGE LEFT FOR PATIENT.

## 2021-08-15 NOTE — Telephone Encounter (Signed)
Addressed in a previously created encounter

## 2021-08-15 NOTE — Telephone Encounter (Signed)
  Follow up Call-  Call back number 08/13/2021  Post procedure Call Back phone  # [phone] cell  Permission to leave phone message Yes  Some recent data might be hidden     Patient questions:  Do you have a fever, pain , or abdominal swelling? No. Pain Score  0 *  Have you tolerated food without any problems? Yes.    Have you been able to return to your normal activities? Yes.    Do you have any questions about your discharge instructions: Diet   No. Medications  No. Follow up visit  No.  Do you have questions or concerns about your Care? Yes.   Pt saw her hematologist who recommended Iron Infusions. She is waiting on this to be scheduled. Will let Dr. Tarri Glenn know. Also the patient has her f/u scheduled on 11/4.  Actions: * If pain score is 4 or above: No action needed, pain <4.

## 2021-08-15 NOTE — Telephone Encounter (Signed)
Pt. Returned your call.

## 2021-08-19 ENCOUNTER — Encounter: Payer: Self-pay | Admitting: Hematology and Oncology

## 2021-08-19 NOTE — Progress Notes (Signed)
Records Request - FINAL REQUEST   Provider and/or Facility: Dr. Earlean Shawl  Document: Signed ROI   ROI has been faxed successfully to Dr. Earlean Shawl. Document(s) and fax confirmation(s) have been placed in the faxed file for future reference.

## 2021-08-20 ENCOUNTER — Telehealth: Payer: Self-pay | Admitting: Pharmacy Technician

## 2021-08-20 ENCOUNTER — Telehealth: Payer: Self-pay | Admitting: Hematology and Oncology

## 2021-08-20 ENCOUNTER — Other Ambulatory Visit: Payer: Self-pay | Admitting: Pharmacy Technician

## 2021-08-20 ENCOUNTER — Encounter: Payer: Self-pay | Admitting: Hematology and Oncology

## 2021-08-20 NOTE — Telephone Encounter (Signed)
Dr. Lorenso Courier,  Josem Kaufmann Submission: Sheila Vincent- Denied Payer: Mcarthur Rossetti Medication & CPT/J Code(s) submitted: Feraheme (ferumoxytol) [phone] Route of submission (phone, fax, portal): phone [phone] Auth type: Buy/Bill Units/visits requested: 2 Reference number: [id number]  Denied due to patient has not tried and or failed step theapy of Venofer or Infed.  Would you like to try venofer?? Please advise.  (FYI) Patient has active coverage. Patient is responsible for 20%. [employer] will cover 80%. OOP $3900 met $581.

## 2021-08-20 NOTE — Telephone Encounter (Signed)
Scheduled per sch msg. Called and left msg  

## 2021-08-20 NOTE — Telephone Encounter (Signed)
ERROR

## 2021-08-20 NOTE — Telephone Encounter (Signed)
Lets do Venofer 235m IV q 7 days x 5 doses instead

## 2021-08-23 ENCOUNTER — Other Ambulatory Visit: Payer: Medicare HMO

## 2021-08-23 DIAGNOSIS — K51011 Ulcerative (chronic) pancolitis with rectal bleeding: Secondary | ICD-10-CM

## 2021-08-23 DIAGNOSIS — R197 Diarrhea, unspecified: Secondary | ICD-10-CM

## 2021-08-26 ENCOUNTER — Ambulatory Visit (INDEPENDENT_AMBULATORY_CARE_PROVIDER_SITE_OTHER): Payer: Medicare HMO

## 2021-08-26 ENCOUNTER — Other Ambulatory Visit: Payer: Self-pay

## 2021-08-26 VITALS — BP 143/82 | HR 79 | Temp 97.5°F | Resp 16 | Ht 64.0 in | Wt 148.8 lb

## 2021-08-26 DIAGNOSIS — D500 Iron deficiency anemia secondary to blood loss (chronic): Secondary | ICD-10-CM

## 2021-08-26 MED ORDER — SODIUM CHLORIDE 0.9 % IV SOLN: Freq: Once | INTRAVENOUS | Status: DC | PRN

## 2021-08-26 MED ORDER — DIPHENHYDRAMINE HCL 50 MG/ML IJ SOLN: 50.0000 mg | Freq: Once | INTRAMUSCULAR | Status: DC | PRN

## 2021-08-26 MED ORDER — EPINEPHRINE 0.3 MG/0.3ML IJ SOAJ: 0.3000 mg | Freq: Once | INTRAMUSCULAR | Status: DC | PRN

## 2021-08-26 MED ORDER — ACETAMINOPHEN 325 MG PO TABS
650.0000 mg | ORAL_TABLET | Freq: Once | ORAL | Status: AC
  Administered 2021-08-26: 650 mg via ORAL
  Filled 2021-08-26: qty 2

## 2021-08-26 MED ORDER — DIPHENHYDRAMINE HCL 25 MG PO CAPS
50.0000 mg | ORAL_CAPSULE | Freq: Once | ORAL | Status: AC
  Administered 2021-08-26: 50 mg via ORAL
  Filled 2021-08-26: qty 2

## 2021-08-26 MED ORDER — SODIUM CHLORIDE 0.9 % IV SOLN
200.0000 mg | Freq: Once | INTRAVENOUS | Status: AC
  Administered 2021-08-26: 200 mg via INTRAVENOUS
  Filled 2021-08-26: qty 10

## 2021-08-26 MED ORDER — METHYLPREDNISOLONE SODIUM SUCC 125 MG IJ SOLR: 125.0000 mg | Freq: Once | INTRAMUSCULAR | Status: DC | PRN

## 2021-08-26 MED ORDER — ALBUTEROL SULFATE HFA 108 (90 BASE) MCG/ACT IN AERS: 2.0000 | INHALATION_SPRAY | Freq: Once | RESPIRATORY_TRACT | Status: DC | PRN

## 2021-08-26 MED ORDER — FAMOTIDINE IN NACL 20-0.9 MG/50ML-% IV SOLN: 20.0000 mg | Freq: Once | INTRAVENOUS | Status: DC | PRN

## 2021-08-26 NOTE — Progress Notes (Signed)
Diagnosis: Iron Deficiency Anemia  Provider:  Marshell Garfinkel, MD  Procedure: Infusion  IV Type: Peripheral, IV Location: R Forearm  Venofer (Iron Sucrose), Dose: 200 mg  Infusion Start Time: 6962  Infusion Stop Time: 9528  Post Infusion IV Care: Observation period completed and Peripheral IV Discontinued  Discharge: Condition: Good, Destination: Home . AVS provided to patient.   Performed by:  Haydee Jabbour, Sherlon Handing, LPN

## 2021-08-27 ENCOUNTER — Other Ambulatory Visit: Payer: Medicare HMO

## 2021-08-27 ENCOUNTER — Ambulatory Visit (INDEPENDENT_AMBULATORY_CARE_PROVIDER_SITE_OTHER): Payer: Medicare HMO | Admitting: *Deleted

## 2021-08-27 DIAGNOSIS — J309 Allergic rhinitis, unspecified: Secondary | ICD-10-CM | POA: Diagnosis not present

## 2021-08-27 DIAGNOSIS — R197 Diarrhea, unspecified: Secondary | ICD-10-CM

## 2021-08-27 DIAGNOSIS — K51011 Ulcerative (chronic) pancolitis with rectal bleeding: Secondary | ICD-10-CM

## 2021-08-28 LAB — HEPATITIS B SURFACE ANTIGEN: Hepatitis B Surface Ag: NONREACTIVE

## 2021-08-28 LAB — QUANTIFERON-TB GOLD PLUS
Mitogen-NIL: 2.29 IU/mL
NIL: 0.03 IU/mL
QuantiFERON-TB Gold Plus: NEGATIVE
TB1-NIL: 0 IU/mL
TB2-NIL: 0 IU/mL

## 2021-08-28 LAB — HEPATITIS B CORE ANTIBODY, IGM: Hep B C IgM: NONREACTIVE

## 2021-08-28 LAB — HEPATITIS B SURFACE ANTIBODY,QUALITATIVE: Hep B S Ab: REACTIVE — AB

## 2021-08-28 LAB — HEPATITIS C ANTIBODY
Hepatitis C Ab: NONREACTIVE
SIGNAL TO CUT-OFF: 0.01 (ref <1.00)

## 2021-08-30 LAB — GI PROFILE, STOOL, PCR

## 2021-09-02 ENCOUNTER — Ambulatory Visit (INDEPENDENT_AMBULATORY_CARE_PROVIDER_SITE_OTHER): Payer: Medicare HMO | Admitting: *Deleted

## 2021-09-02 ENCOUNTER — Other Ambulatory Visit: Payer: Self-pay

## 2021-09-02 VITALS — BP 136/77 | HR 90 | Temp 97.5°F | Resp 20 | Ht 64.0 in | Wt 150.6 lb

## 2021-09-02 DIAGNOSIS — D500 Iron deficiency anemia secondary to blood loss (chronic): Secondary | ICD-10-CM

## 2021-09-02 MED ORDER — DIPHENHYDRAMINE HCL 25 MG PO CAPS
50.0000 mg | ORAL_CAPSULE | Freq: Once | ORAL | Status: AC
  Administered 2021-09-02: 50 mg via ORAL
  Filled 2021-09-02: qty 2

## 2021-09-02 MED ORDER — SODIUM CHLORIDE 0.9 % IV SOLN
200.0000 mg | Freq: Once | INTRAVENOUS | Status: AC
  Administered 2021-09-02: 200 mg via INTRAVENOUS
  Filled 2021-09-02 (×2): qty 10

## 2021-09-02 MED ORDER — EPINEPHRINE 0.3 MG/0.3ML IJ SOAJ: 0.3000 mg | Freq: Once | INTRAMUSCULAR | Status: DC | PRN

## 2021-09-02 MED ORDER — SODIUM CHLORIDE 0.9 % IV SOLN: Freq: Once | INTRAVENOUS | Status: DC | PRN

## 2021-09-02 MED ORDER — DIPHENHYDRAMINE HCL 50 MG/ML IJ SOLN: 50.0000 mg | Freq: Once | INTRAMUSCULAR | Status: DC | PRN

## 2021-09-02 MED ORDER — ALBUTEROL SULFATE HFA 108 (90 BASE) MCG/ACT IN AERS: 2.0000 | INHALATION_SPRAY | Freq: Once | RESPIRATORY_TRACT | Status: DC | PRN

## 2021-09-02 MED ORDER — ACETAMINOPHEN 325 MG PO TABS
650.0000 mg | ORAL_TABLET | Freq: Once | ORAL | Status: AC
  Administered 2021-09-02: 650 mg via ORAL
  Filled 2021-09-02: qty 2

## 2021-09-02 MED ORDER — FAMOTIDINE IN NACL 20-0.9 MG/50ML-% IV SOLN: 20.0000 mg | Freq: Once | INTRAVENOUS | Status: DC | PRN

## 2021-09-02 MED ORDER — METHYLPREDNISOLONE SODIUM SUCC 125 MG IJ SOLR: 125.0000 mg | Freq: Once | INTRAMUSCULAR | Status: DC | PRN

## 2021-09-02 NOTE — Progress Notes (Signed)
Diagnosis: Iron Deficiency Anemia  Provider:  Marshell Garfinkel, MD  Procedure: Infusion  IV Type: Peripheral, IV Location: L Forearm  Venofer (Iron Sucrose), Dose: 200 mg  Infusion Start Time: 1340 pm  Infusion Stop Time: 1356 pm  Post Infusion IV Care: Observation period completed and Peripheral IV Discontinued  Discharge: Condition: Good, Destination: Home . AVS provided to patient.   Performed by:  Oren Beckmann, RN

## 2021-09-02 NOTE — Progress Notes (Signed)
09/02/2021 Sheila Vincent [id number] 01/06/1961   Chief Complaint: Colitis follow up  History of Present Illness: Sheila Vincent is a 60 year old female with a past medical history of anxiety, depression, arthritis, hypertension, asthma, kidney stones GERD and Crohn's disease.  Previously followed by [facility] then transitioned to [facility] 02/2021. She underwent EGD and colonoscopy 08/13/2021 by Dr. Tarri Glenn. The EGD showed mild gastritis without evidence of H. pylori.  Duodenal biopsies were negative for celiac disease.  The colonoscopy identified pancolitis.  Path report showed mildly active chronic colitis and proctitis without granulomas or dysplasia, likely ulcerative colitis.  She was prescribed Prednisone 40 mg taper with instructions to reduce by 5 mg q. 7 days. She presents today for further follow up post EGD and colonoscopy. No BM x 7 days post colonoscopy then watery to loose nonbloody diarrhea recurred, 5 to 7 episodes daily with less abdominal cramping. Urgent diarrhea after eating. No specific food triggers. Prior to starting Prednisone, she reported passing 15 to 30 diarrhea BMs daily. Wed 10/26 she vomited after eating BBQ. She developed a red face and felt her BP was high so she reduced Prednisone 48m down to 265mone week ago. No NSAID use. She was evaluated by hematologist Dr. DoLorenso Courier0/10/2021 regarding her IDA with plans to administer Venofer IV 20031m 7 days x 5 doses, first two doses of Venofer  were infused on 10/24  and  10/31.   Labs 08/14/2021: Iron 23. TIBC 476. Ferritin 8. B12 186.  Labs 08/23/2021: HCVAb nonreactive.HBcAb IgM nonreactive.  HBsAb positive.  HBsAg negative.  QuantiFERON-TB Gold negative.   EGD 08/13/2021: - Normal esophagus. - Normal stomach. Biopsied. - Mucosal changes in the duodenum. Biopsied. - The examination was otherwise normal.  Colonoscopy 08/13/2021: - Diffuse moderate inflammation was found in the entire examined colon secondary  to pancolitis. Biopsied. - The examined portion of the ileum was normal. - The examination was otherwise normal on direct and retroflexion views. 1. Surgical [P], duodenal - DUODENAL MUCOSA WITH NO SPECIFIC HISTOPATHOLOGIC CHANGES - NEGATIVE FOR INCREASED INTRAEPITHELIAL LYMPHOCYTES OR VILLOUS ARCHITECTURAL CHANGES 2. Surgical [P], fundus, gastric antrum, and gastric body - GASTRIC ANTRAL AND OXYNTIC MUCOSA WITH MILD CHRONIC GASTRITIS - GASTRIC OXYNTIC MUCOSA WITH PARIETAL CELL HYPERPLASIA AS CAN BE SEEN IN HYPERGASTRINEMIC STATES SUCH AS PPI THERAPY. - WARTHIN STARRY STAIN IS NEGATIVE FOR HELICOBACTER PYLORI 3. Surgical [P], colon, terminal ileum - ILEAL MUCOSA WITH NO SPECIFIC HISTOPATHOLOGIC CHANGES - NEGATIVE FOR ACUTE INFLAMMATION, FEATURES OF CHRONICITY OR GRANULOMAS 4. Surgical [P], right colon - MILDLY ACTIVE CHRONIC COLITIS, SEE NOTE - NEGATIVE FOR GRANULOMAS OR DYSPLASIA 5. Surgical [P], colon, transverse - MILDLY ACTIVE CHRONIC COLITIS, SEE NOTE - NEGATIVE FOR GRANULOMAS OR DYSPLASIA 6. Surgical [P], left colon bx's - MILDLY ACTIVE CHRONIC COLITIS, SEE NOTE - NEGATIVE FOR GRANULOMAS OR DYSPLASIA 7. Surgical [P], colon, rectum - MILDLY ACTIVE CHRONIC PROCTITIS, SEE NOTE - NEGATIVE FOR GRANULOMAS OR DYSPLASIA  CBC Latest Ref Rng & Units 08/14/2021 08/09/2021 08/06/2021  WBC 4.0 - 10.5 K/uL 14.3(H) - 10.1  Hemoglobin 12.0 - 15.0 g/dL 10.6(L) 10.8(L) 7.0 Repeated and verified X2.(LL)  Hematocrit 36.0 - 46.0 % 37.9 36.9 24.3(L)  Platelets 150 - 400 K/uL 640(H) - 600.0(H)     CMP Latest Ref Rng & Units 08/14/2021 08/09/2021 08/06/2021  Glucose 70 - 99 mg/dL 182(H) 148(H) 133(H)  BUN 8 - 23 mg/dL 11 10 9   Creatinine 0.44 - 1.00 mg/dL 0.68 0.70 0.63  Sodium 135 - 145  mmol/L 143 139 139  Potassium 3.5 - 5.1 mmol/L 3.8 3.8 3.8  Chloride 98 - 111 mmol/L 111 111 106  CO2 22 - 32 mmol/L 24 24 25   Calcium 8.9 - 10.3 mg/dL 9.7 9.2 9.1  Total Protein 6.5 - 8.1 g/dL 8.2(H) - -   Total Bilirubin 0.3 - 1.2 mg/dL 0.4 - -  Alkaline Phos 38 - 126 U/L 107 - -  AST 15 - 41 U/L 18 - -  ALT 0 - 44 U/L 17 - -    Current Outpatient Medications on File Prior to Visit  Medication Sig Dispense Refill   acetaminophen (TYLENOL) 325 MG tablet Take 2 tablets (650 mg total) by mouth every 6 (six) hours as needed for mild pain (or Fever >/= 101). 60 tablet 0   albuterol (VENTOLIN HFA) 108 (90 Base) MCG/ACT inhaler Inhale 2 puffs into the lungs every 4 (four) hours as needed for wheezing or shortness of breath. 1 each 1   azelastine (ASTELIN) 0.1 % nasal spray Place 2 sprays into both nostrils 2 (two) times daily. Use in each nostril as directed (Patient taking differently: Place 2 sprays into both nostrils 2 (two) times daily.) 30 mL 4   budesonide-formoterol (SYMBICORT) 160-4.5 MCG/ACT inhaler Inhale 2 puffs twice a day for 2 weeks or until cough and wheeze free, then stop. (Patient taking differently: Inhale 2 puffs into the lungs 2 (two) times daily.) 1 each 5   cetirizine (ZYRTEC) 10 MG tablet Take 10 mg by mouth daily.     clotrimazole (LOTRIMIN) 1 % cream Apply topically 2 (two) times daily. 30 g 0   fluticasone (FLONASE) 50 MCG/ACT nasal spray SHAKE LIQUID AND USE 2 SPRAYS IN EACH NOSTRIL DAILY (Patient taking differently: Place 1 spray into both nostrils 2 (two) times daily.) 48 g 0   hydrocortisone 2.5 % ointment Apply 1 application topically 2 (two) times daily.     montelukast (SINGULAIR) 10 MG tablet Take 1 tablet (10 mg total) by mouth at bedtime. (Patient taking differently: Take 10 mg by mouth daily.) 90 tablet 1   omeprazole (PRILOSEC) 20 MG capsule TAKE 1 CAPSULE(20 MG) BY MOUTH TWICE DAILY AS NEEDED (Patient taking differently: Take 20 mg by mouth daily.) 180 capsule 1   predniSONE (DELTASONE) 10 MG tablet Take 4 tablets (40 mg total) by mouth daily with breakfast. 120 tablet 2   tiZANidine (ZANAFLEX) 4 MG tablet Take 1 tablet (4 mg total) by mouth every 6 (six) hours as  needed for muscle spasms. 30 tablet 1   No current facility-administered medications on file prior to visit.   Allergies  Allergen Reactions   Bactrim Anxiety    Nervous,"cant sit still", paranoid   Current Medications, Allergies, Past Medical History, Past Surgical History, Family History and Social History were reviewed in [employer] record.  Review of Systems:   Constitutional: Negative for fever, sweats, chills or weight loss.  Respiratory: Negative for shortness of breath.   Cardiovascular: Negative for chest pain, palpitations and leg swelling.  Gastrointestinal: See HPI.  Musculoskeletal: Negative for back pain or muscle aches.  Neurological: Negative for dizziness, headaches or paresthesias.   Physical Exam: There were no vitals taken for this visit. Wt Readings from Last 3 Encounters:  09/03/21 150 lb (68 kg)  09/02/21 150 lb 9.6 oz (68.3 kg)  08/26/21 148 lb 12.8 oz (67.5 kg)    General: 60 year old female in no acute distress. Head: Normocephalic and atraumatic. Eyes: No scleral  icterus. Conjunctiva pink . Ears: Normal auditory acuity. Mouth: Dentition intact. No ulcers or lesions.  Lungs: Clear throughout to auscultation. Heart: Regular rate and rhythm, no murmur. Abdomen: Soft, nontender and nondistended. No masses or hepatomegaly. Normal bowel sounds x 4 quadrants.  Rectal: Deferred.  Musculoskeletal: Symmetrical with no gross deformities. Extremities: No edema. Neurological: Alert oriented x 4. No focal deficits.  Psychological: Alert and cooperative. Normal mood and affect  Assessment and Recommendations:  59) 60 year old female with a reported history of Crohn's disease. Colonoscopy 08/13/2021 showed pancolitis, path report favoring UC. -Continue Prednisone 1m QD for now, patient to call office in 2 weeks for further Prednisone taper -Oral Mesalamine 1.2 g 2 p.o. twice daily -Dicyclomine 19mone po bid PRN abdominal cramping   -Small bowel capsule endoscopy to rule out Crohn's small bowel disease, benefits and risk discussed including if the capsule doe not pass/gets stuck possible surgery would be required to remove it -Follow up with Dr. BeTarri Glennn 4 to 6 weeks  -Patient to call our office if her symptoms worsen   2) Iron deficiency anemia. IV iron (Venofer) infusions per hematology. Hg 10.6. Iron 23. Ferritin 8. -Small bowel capsule endoscopy as ordered above to rule out small bowel related IDA -Continue IV iron infusions per Dr. DoLorenso Courier 3) Low normal B12 level  -Follow up with PCP regarding border line low vitamin B 12 levels, recommend vitamin B 12 injections

## 2021-09-03 ENCOUNTER — Ambulatory Visit (INDEPENDENT_AMBULATORY_CARE_PROVIDER_SITE_OTHER): Payer: Medicare HMO | Admitting: Nurse Practitioner

## 2021-09-03 ENCOUNTER — Ambulatory Visit: Payer: Medicare HMO | Admitting: Nurse Practitioner

## 2021-09-03 ENCOUNTER — Encounter: Payer: Self-pay | Admitting: Nurse Practitioner

## 2021-09-03 VITALS — BP 138/70 | HR 93 | Ht 64.0 in | Wt 150.0 lb

## 2021-09-03 DIAGNOSIS — K529 Noninfective gastroenteritis and colitis, unspecified: Secondary | ICD-10-CM | POA: Diagnosis not present

## 2021-09-03 DIAGNOSIS — D509 Iron deficiency anemia, unspecified: Secondary | ICD-10-CM

## 2021-09-03 MED ORDER — DICYCLOMINE HCL 10 MG PO CAPS: 10.0000 mg | ORAL_CAPSULE | Freq: Two times a day (BID) | ORAL | 0 refills | Status: DC | PRN

## 2021-09-03 MED ORDER — MESALAMINE 1.2 G PO TBEC: 2.4000 g | DELAYED_RELEASE_TABLET | Freq: Two times a day (BID) | ORAL | 2 refills | Status: DC

## 2021-09-03 NOTE — Patient Instructions (Signed)
We have scheduled you for the small bowel capsule endoscopy on 10/02/21 at 8:30. Please arrive at 8:15.  We have scheduled you a follow up with Dr. Tarri Glenn on 10/11/21 at 10:30.  MEDICATION: We have sent the following medication to your pharmacy for you to pick up at your convenience: Dicyclomine 10 MG tablet, take 1 tablet twice a day as needed. Mesalamine 1.2 gram tablet, take 2 tablets twice a day.  RECOMMENDATIONS: Continue Prednisone 10 mg tablet 2 tablets once a day for now, call in 2 weeks for further taper instructions.  It was great seeing you today! Thank you for entrusting me with your care and choosing [facility].  Noralyn Pick, CRNP  The [facility] providers would like to encourage you to use [facility] to communicate with providers for non-urgent requests or questions.  Due to long hold times on the telephone, sending your provider a message by [facility] may be faster and more efficient way to get a response. Please allow 48 business hours for a response.  Please remember that this is for non-urgent requests/questions.  If you are age 79 or older, your body mass index should be between 23-30. Your Body mass index is 25.75 kg/m. If this is out of the aforementioned range listed, please consider follow up with your Primary Care Provider.  If you are age 44 or younger, your body mass index should be between 19-25. Your Body mass index is 25.75 kg/m. If this is out of the aformentioned range listed, please consider follow up with your Primary Care Provider.

## 2021-09-04 ENCOUNTER — Emergency Department (HOSPITAL_COMMUNITY): Payer: Medicare HMO

## 2021-09-04 ENCOUNTER — Other Ambulatory Visit: Payer: Self-pay

## 2021-09-04 ENCOUNTER — Encounter (HOSPITAL_COMMUNITY): Payer: Self-pay

## 2021-09-04 ENCOUNTER — Emergency Department (HOSPITAL_COMMUNITY)
Admission: EM | Admit: 2021-09-04 | Discharge: 2021-09-05 | Disposition: A | Discharge status: Home / Self Care | Payer: Medicare HMO | Attending: Emergency Medicine | Admitting: Emergency Medicine

## 2021-09-04 DIAGNOSIS — R1031 Right lower quadrant pain: Secondary | ICD-10-CM | POA: Diagnosis present

## 2021-09-04 DIAGNOSIS — N390 Urinary tract infection, site not specified: Secondary | ICD-10-CM | POA: Diagnosis not present

## 2021-09-04 DIAGNOSIS — I10 Essential (primary) hypertension: Secondary | ICD-10-CM | POA: Diagnosis not present

## 2021-09-04 DIAGNOSIS — D72829 Elevated white blood cell count, unspecified: Secondary | ICD-10-CM | POA: Diagnosis not present

## 2021-09-04 DIAGNOSIS — N23 Unspecified renal colic: Secondary | ICD-10-CM | POA: Diagnosis not present

## 2021-09-04 DIAGNOSIS — Z7951 Long term (current) use of inhaled steroids: Secondary | ICD-10-CM | POA: Insufficient documentation

## 2021-09-04 DIAGNOSIS — J4540 Moderate persistent asthma, uncomplicated: Secondary | ICD-10-CM | POA: Insufficient documentation

## 2021-09-04 LAB — COMPREHENSIVE METABOLIC PANEL
ALT: 18 U/L (ref 0–44)
AST: 22 U/L (ref 15–41)
Albumin: 4.1 g/dL (ref 3.5–5.0)
Alkaline Phosphatase: 108 U/L (ref 38–126)
Anion gap: 11 (ref 5–15)
BUN: 14 mg/dL (ref 8–23)
CO2: 23 mmol/L (ref 22–32)
Calcium: 9.3 mg/dL (ref 8.9–10.3)
Chloride: 104 mmol/L (ref 98–111)
Creatinine, Ser: 0.96 mg/dL (ref 0.44–1.00)
GFR, Estimated: >60 mL/min (ref >60)
Glucose, Bld: 234 mg/dL — ABNORMAL HIGH (ref 70–99)
Potassium: 3.9 mmol/L (ref 3.5–5.1)
Sodium: 138 mmol/L (ref 135–145)
Total Bilirubin: 0.7 mg/dL (ref 0.3–1.2)
Total Protein: 7.5 g/dL (ref 6.5–8.1)

## 2021-09-04 LAB — CBC WITH DIFFERENTIAL/PLATELET
Abs Immature Granulocytes: 0.18 10*3/uL — ABNORMAL HIGH (ref 0.00–0.07)
Basophils Absolute: 0.1 10*3/uL (ref 0.0–0.1)
Basophils Relative: 0 %
Eosinophils Absolute: 0.1 10*3/uL (ref 0.0–0.5)
Eosinophils Relative: 0 %
HCT: 36.6 % (ref 36.0–46.0)
Hemoglobin: 10.8 g/dL — ABNORMAL LOW (ref 12.0–15.0)
Immature Granulocytes: 1 %
Lymphocytes Relative: 15 %
Lymphs Abs: 2.6 10*3/uL (ref 0.7–4.0)
MCH: 21.8 pg — ABNORMAL LOW (ref 26.0–34.0)
MCHC: 29.5 g/dL — ABNORMAL LOW (ref 30.0–36.0)
MCV: 73.9 fL — ABNORMAL LOW (ref 80.0–100.0)
Monocytes Absolute: 1.1 10*3/uL — ABNORMAL HIGH (ref 0.1–1.0)
Monocytes Relative: 6 %
Neutro Abs: 13.2 10*3/uL — ABNORMAL HIGH (ref 1.7–7.7)
Neutrophils Relative %: 78 %
Platelets: 437 10*3/uL — ABNORMAL HIGH (ref 150–400)
RBC: 4.95 MIL/uL (ref 3.87–5.11)
RDW: 29.8 % — ABNORMAL HIGH (ref 11.5–15.5)
WBC: 17.1 10*3/uL — ABNORMAL HIGH (ref 4.0–10.5)
nRBC: 0 % (ref 0.0–0.2)

## 2021-09-04 LAB — LIPASE, BLOOD: Lipase: 24 U/L (ref 11–51)

## 2021-09-04 MED ORDER — HYDROCODONE-ACETAMINOPHEN 5-325 MG PO TABS
1.0000 | ORAL_TABLET | Freq: Once | ORAL | Status: AC
  Administered 2021-09-05: 1 via ORAL
  Filled 2021-09-04 (×2): qty 1

## 2021-09-04 MED ORDER — ONDANSETRON 4 MG PO TBDP
4.0000 mg | ORAL_TABLET | Freq: Once | ORAL | Status: AC
  Administered 2021-09-04: 4 mg via ORAL
  Filled 2021-09-04: qty 1

## 2021-09-04 NOTE — ED Provider Notes (Signed)
Emergency Medicine Provider Triage Evaluation Note  Sheila Vincent , a 60 y.o. female  was evaluated in triage.  Pt complains of right flank pain with radiation into the lower abdomen today.  Has a history of kidney stone.  Pain started abruptly.  She reports associated nausea and vomiting and chills.  Also complains of urinary frequency and burning.  Review of Systems  Positive:  Negative: See above   Physical Exam  BP (!) 161/101 (BP Location: Left Arm)   Pulse 85   Temp 98.4 F (36.9 C) (Oral)   Resp (!) 24   SpO2 96%  Gen:   Awake, no distress   Resp:  Normal effort  MSK:   Moves extremities without difficulty  Other:  Moderate lower abdominal tenderness.  No significant CVA tenderness bilaterally.  Medical Decision Making  Medically screening exam initiated at 10:01 PM.  Appropriate orders placed.  Sheila Vincent was informed that the remainder of the evaluation will be completed by another provider, this initial triage assessment does not replace that evaluation, and the importance of remaining in the ED until their evaluation is complete.     Hendricks Limes, PA-C 09/04/21 2203    Dorie Rank, MD 09/06/21 [phone]

## 2021-09-04 NOTE — ED Triage Notes (Signed)
Pt c/o abdominal pain starting today. Pt also c/o back pain. Pt states she has Crohns Disease, last bowel movement today.

## 2021-09-05 DIAGNOSIS — N390 Urinary tract infection, site not specified: Secondary | ICD-10-CM | POA: Diagnosis not present

## 2021-09-05 LAB — URINALYSIS, ROUTINE W REFLEX MICROSCOPIC
Bilirubin Urine: NEGATIVE
Glucose, UA: 50 mg/dL — AB
Ketones, ur: NEGATIVE mg/dL
Nitrite: NEGATIVE
Protein, ur: 30 mg/dL — AB
RBC / HPF: >50 RBC/hpf — ABNORMAL HIGH (ref 0–5)
Specific Gravity, Urine: 1.014 (ref 1.005–1.030)
pH: 6 (ref 5.0–8.0)

## 2021-09-05 MED ORDER — MORPHINE SULFATE (PF) 4 MG/ML IV SOLN
4.0000 mg | Freq: Once | INTRAVENOUS | Status: AC
  Administered 2021-09-05: 4 mg via INTRAVENOUS
  Filled 2021-09-05: qty 1

## 2021-09-05 MED ORDER — CEPHALEXIN 500 MG PO CAPS: 500.0000 mg | ORAL_CAPSULE | Freq: Four times a day (QID) | ORAL | 0 refills | Status: AC

## 2021-09-05 MED ORDER — HYDROCODONE-ACETAMINOPHEN 5-325 MG PO TABS
2.0000 | ORAL_TABLET | ORAL | 0 refills | Status: DC | PRN
Start: 2021-09-05 — End: 2021-10-18

## 2021-09-05 MED ORDER — KETOROLAC TROMETHAMINE 30 MG/ML IJ SOLN
30.0000 mg | Freq: Once | INTRAMUSCULAR | Status: AC
  Administered 2021-09-05: 30 mg via INTRAVENOUS
  Filled 2021-09-05: qty 1

## 2021-09-05 MED ORDER — SODIUM CHLORIDE 0.9 % IV SOLN
1.0000 g | Freq: Once | INTRAVENOUS | Status: AC
  Administered 2021-09-05: 1 g via INTRAVENOUS
  Filled 2021-09-05: qty 10

## 2021-09-05 MED ORDER — ONDANSETRON HCL 4 MG PO TABS: 4.0000 mg | ORAL_TABLET | Freq: Three times a day (TID) | ORAL | 0 refills | Status: DC | PRN

## 2021-09-05 MED ORDER — TAMSULOSIN HCL 0.4 MG PO CAPS: 0.4000 mg | ORAL_CAPSULE | Freq: Two times a day (BID) | ORAL | 0 refills | Status: DC

## 2021-09-05 NOTE — ED Notes (Signed)
Patient vomiting, unable to administer norco at this time.

## 2021-09-05 NOTE — Discharge Instructions (Addendum)
1. Medications: Vicodin, Flomax, Zofran, Keflex; usual home medications 2. Treatment: rest, drink plenty of fluids,  3. Follow Up: Please followup with your primary doctor and/or urology in 2-3 days for discussion of your diagnoses and further evaluation after today's visit; if you do not have a primary care doctor use the resource guide provided to find one; Please return to the ER for worsening pain, fevers or persistent vomiting.
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
## 2021-09-06 LAB — URINE CULTURE

## 2021-09-08 NOTE — Progress Notes (Signed)
Reviewed and agree with management plans. ? ?Chanda L. Lynell Greenhouse, MD, MPH  ?

## 2021-09-10 ENCOUNTER — Other Ambulatory Visit: Payer: Self-pay

## 2021-09-10 ENCOUNTER — Ambulatory Visit (INDEPENDENT_AMBULATORY_CARE_PROVIDER_SITE_OTHER): Payer: Medicare HMO

## 2021-09-10 VITALS — BP 111/68 | HR 83 | Temp 98.1°F | Resp 18 | Ht 64.0 in | Wt 152.8 lb

## 2021-09-10 DIAGNOSIS — D500 Iron deficiency anemia secondary to blood loss (chronic): Secondary | ICD-10-CM

## 2021-09-10 MED ORDER — SODIUM CHLORIDE 0.9 % IV SOLN
200.0000 mg | Freq: Once | INTRAVENOUS | Status: AC
  Administered 2021-09-10: 200 mg via INTRAVENOUS
  Filled 2021-09-10: qty 10

## 2021-09-10 MED ORDER — EPINEPHRINE 0.3 MG/0.3ML IJ SOAJ: 0.3000 mg | Freq: Once | INTRAMUSCULAR | Status: DC | PRN

## 2021-09-10 MED ORDER — METHYLPREDNISOLONE SODIUM SUCC 125 MG IJ SOLR: 125.0000 mg | Freq: Once | INTRAMUSCULAR | Status: DC | PRN

## 2021-09-10 MED ORDER — DIPHENHYDRAMINE HCL 25 MG PO CAPS
50.0000 mg | ORAL_CAPSULE | Freq: Once | ORAL | Status: AC
  Administered 2021-09-10: 50 mg via ORAL

## 2021-09-10 MED ORDER — FAMOTIDINE IN NACL 20-0.9 MG/50ML-% IV SOLN: 20.0000 mg | Freq: Once | INTRAVENOUS | Status: DC | PRN

## 2021-09-10 MED ORDER — ACETAMINOPHEN 325 MG PO TABS
650.0000 mg | ORAL_TABLET | Freq: Once | ORAL | Status: AC
  Administered 2021-09-10: 650 mg via ORAL

## 2021-09-10 MED ORDER — DIPHENHYDRAMINE HCL 50 MG/ML IJ SOLN: 50.0000 mg | Freq: Once | INTRAMUSCULAR | Status: DC | PRN

## 2021-09-10 MED ORDER — SODIUM CHLORIDE 0.9 % IV SOLN: Freq: Once | INTRAVENOUS | Status: DC | PRN

## 2021-09-10 MED ORDER — ALBUTEROL SULFATE HFA 108 (90 BASE) MCG/ACT IN AERS: 2.0000 | INHALATION_SPRAY | Freq: Once | RESPIRATORY_TRACT | Status: DC | PRN

## 2021-09-10 NOTE — Progress Notes (Deleted)
Diagnosis: Iron Deficiency Anemia  Provider:  Marshell Garfinkel, MD  Procedure: Infusion  IV Type: Peripheral, IV Location: L Forearm  Venofer (Iron Sucrose), Dose: 200 mg  Infusion Start Time: 0086  {Infusion Stop Time:25400}  Post Infusion IV Care: Peripheral IV Discontinued  Discharge: Condition: Good, Destination: Home . AVS provided to patient.   Performed by:  Charlie Pitter, RN

## 2021-09-10 NOTE — Progress Notes (Signed)
Diagnosis: Iron Deficiency Anemia  Provider:  Marshell Garfinkel, MD  Procedure: Infusion  IV Type: Peripheral, IV Location: L Hand  Venofer (Iron Sucrose), Dose: 200 mg  Infusion Start Time: 1068  Infusion Stop Time: 1661  Post Infusion IV Care: Peripheral IV Discontinued  Discharge: Condition: Good, Destination: Home . AVS provided to patient.   Performed by:  Charlie Pitter, RN

## 2021-09-17 ENCOUNTER — Ambulatory Visit (INDEPENDENT_AMBULATORY_CARE_PROVIDER_SITE_OTHER): Payer: Medicare HMO

## 2021-09-17 ENCOUNTER — Other Ambulatory Visit: Payer: Self-pay

## 2021-09-17 VITALS — BP 117/74 | HR 89 | Temp 97.5°F | Resp 16 | Ht 64.0 in | Wt 150.4 lb

## 2021-09-17 DIAGNOSIS — D500 Iron deficiency anemia secondary to blood loss (chronic): Secondary | ICD-10-CM | POA: Diagnosis not present

## 2021-09-17 MED ORDER — FAMOTIDINE IN NACL 20-0.9 MG/50ML-% IV SOLN: 20.0000 mg | Freq: Once | INTRAVENOUS | Status: DC | PRN

## 2021-09-17 MED ORDER — EPINEPHRINE 0.3 MG/0.3ML IJ SOAJ: 0.3000 mg | Freq: Once | INTRAMUSCULAR | Status: DC | PRN

## 2021-09-17 MED ORDER — DIPHENHYDRAMINE HCL 25 MG PO CAPS
50.0000 mg | ORAL_CAPSULE | Freq: Once | ORAL | Status: AC
  Administered 2021-09-17: 50 mg via ORAL
  Filled 2021-09-17: qty 2

## 2021-09-17 MED ORDER — METHYLPREDNISOLONE SODIUM SUCC 125 MG IJ SOLR: 125.0000 mg | Freq: Once | INTRAMUSCULAR | Status: DC | PRN

## 2021-09-17 MED ORDER — HEPARIN SOD (PORK) LOCK FLUSH 100 UNIT/ML IV SOLN: 250.0000 [IU] | Freq: Once | INTRAVENOUS | Status: DC | PRN

## 2021-09-17 MED ORDER — ANTICOAGULANT SODIUM CITRATE 4% (200MG/5ML) IV SOLN
5.0000 mL | Freq: Once | Status: DC | PRN
  Filled 2021-09-17: qty 5

## 2021-09-17 MED ORDER — SODIUM CHLORIDE 0.9 % IV SOLN
200.0000 mg | Freq: Once | INTRAVENOUS | Status: AC
  Administered 2021-09-17: 200 mg via INTRAVENOUS
  Filled 2021-09-17: qty 10

## 2021-09-17 MED ORDER — SODIUM CHLORIDE 0.9% FLUSH: 10.0000 mL | Freq: Once | INTRAVENOUS | Status: DC | PRN

## 2021-09-17 MED ORDER — HEPARIN SOD (PORK) LOCK FLUSH 100 UNIT/ML IV SOLN: 500.0000 [IU] | Freq: Once | INTRAVENOUS | Status: DC | PRN

## 2021-09-17 MED ORDER — SODIUM CHLORIDE 0.9% FLUSH: 3.0000 mL | Freq: Once | INTRAVENOUS | Status: DC | PRN

## 2021-09-17 MED ORDER — ALTEPLASE 2 MG IJ SOLR: 2.0000 mg | Freq: Once | INTRAMUSCULAR | Status: DC | PRN

## 2021-09-17 MED ORDER — DIPHENHYDRAMINE HCL 50 MG/ML IJ SOLN: 50.0000 mg | Freq: Once | INTRAMUSCULAR | Status: DC | PRN

## 2021-09-17 MED ORDER — SODIUM CHLORIDE 0.9 % IV SOLN: Freq: Once | INTRAVENOUS | Status: DC | PRN

## 2021-09-17 MED ORDER — ALBUTEROL SULFATE HFA 108 (90 BASE) MCG/ACT IN AERS: 2.0000 | INHALATION_SPRAY | Freq: Once | RESPIRATORY_TRACT | Status: DC | PRN

## 2021-09-17 MED ORDER — ACETAMINOPHEN 325 MG PO TABS
650.0000 mg | ORAL_TABLET | Freq: Once | ORAL | Status: AC
  Administered 2021-09-17: 650 mg via ORAL
  Filled 2021-09-17: qty 2

## 2021-09-17 NOTE — Progress Notes (Signed)
Diagnosis: Iron Deficiency Anemia  Provider:  Marshell Garfinkel, MD  Procedure: Infusion  IV Type: Peripheral, IV Location: L Forearm  Venofer (Iron Sucrose), Dose: 200 mg  Infusion Start Time: 0707 09/17/2021  Infusion Stop Time: 2171  Post Infusion IV Care: Peripheral IV Discontinued  Discharge: Condition: Good, Destination: Home . AVS provided to patient.   Performed by:  Arnoldo Morale, RN

## 2021-09-18 ENCOUNTER — Other Ambulatory Visit: Payer: Self-pay | Admitting: Urology

## 2021-09-23 ENCOUNTER — Telehealth: Payer: Self-pay

## 2021-09-23 ENCOUNTER — Telehealth: Payer: Self-pay | Admitting: Nurse Practitioner

## 2021-09-23 ENCOUNTER — Other Ambulatory Visit: Payer: Self-pay

## 2021-09-23 DIAGNOSIS — K529 Noninfective gastroenteritis and colitis, unspecified: Secondary | ICD-10-CM

## 2021-09-23 DIAGNOSIS — K51011 Ulcerative (chronic) pancolitis with rectal bleeding: Secondary | ICD-10-CM

## 2021-09-23 MED ORDER — PREDNISONE 10 MG PO TABS: ORAL_TABLET | ORAL | 0 refills | Status: DC

## 2021-09-23 MED ORDER — MESALAMINE 1.2 G PO TBEC: 4.8000 g | DELAYED_RELEASE_TABLET | Freq: Every day | ORAL | 2 refills | Status: DC

## 2021-09-23 NOTE — Telephone Encounter (Signed)
Sheila Vincent, refer to office visit 09/03/2021. Patient did not call me with a 2 week update as requested. Pls call patient and verify if she is still on Prednisone 52m QD or if she restarted taper.   If her colitis sx have significantly improved she should taper Prednisone down by 570mevery 7 days until off. Continue Oral Mesalamine. Follow up with Dr. BeTarri Glenn2 07/2021 as scheduled. Thx

## 2021-09-23 NOTE — Telephone Encounter (Signed)
Rx for increase of Lialda sent to pt preferred pharmacy. Also placed order for repeat fecal calprotectin. Called pt to make her aware of N/O's and verify symptoms. LVM requesting returned call. Based on symptoms, will send Rx for Prednisone taper. Will await returned call.

## 2021-09-23 NOTE — Telephone Encounter (Signed)
Called patient regarding prednisone taper and patient said she has already tapered off of the prednisone but is still having diarrhea. Told patient to keep appointment on December 9th and to continue Mesalamine.

## 2021-09-23 NOTE — Telephone Encounter (Signed)
PRIOR AUTHORIZATION  PA initiation date: 09/23/21  Medication: [place] completed electronically through [employer] My Meds: Yes  Will await insurance response re: approval/denial.  Sheila Vincent (Key: [id number])  Your information has been submitted to [facility]. [employer] will review the request and will issue a decision, typically within 3-7 days from your submission. You can check the updated outcome later by reopening this request.  If [employer] has not responded in 3-7 days or if you have any questions about your ePA request, please contact [employer] at [phone]. If you think there may be a problem with your PA request, use our live chat feature at the bottom right.  For [place] requests, please call [phone].  [facility] Key[facility] Bal[facility] - PA Case ID: [id number] Need help? Call us at [phone] Status Sent to [place] 1.2GM dr tablets Form [employer] Electronic PA Form

## 2021-09-24 ENCOUNTER — Other Ambulatory Visit: Payer: Self-pay

## 2021-09-24 ENCOUNTER — Other Ambulatory Visit: Payer: Medicare HMO

## 2021-09-24 ENCOUNTER — Ambulatory Visit (INDEPENDENT_AMBULATORY_CARE_PROVIDER_SITE_OTHER): Payer: Medicare HMO | Admitting: *Deleted

## 2021-09-24 VITALS — BP 114/69 | HR 78 | Temp 98.3°F | Resp 16 | Ht 64.0 in | Wt 152.0 lb

## 2021-09-24 DIAGNOSIS — D500 Iron deficiency anemia secondary to blood loss (chronic): Secondary | ICD-10-CM | POA: Diagnosis not present

## 2021-09-24 MED ORDER — SODIUM CHLORIDE 0.9 % IV SOLN
200.0000 mg | Freq: Once | INTRAVENOUS | Status: AC
  Administered 2021-09-24: 200 mg via INTRAVENOUS
  Filled 2021-09-24 (×2): qty 10

## 2021-09-24 MED ORDER — ACETAMINOPHEN 325 MG PO TABS
650.0000 mg | ORAL_TABLET | Freq: Once | ORAL | Status: AC
  Administered 2021-09-24: 650 mg via ORAL

## 2021-09-24 MED ORDER — FAMOTIDINE IN NACL 20-0.9 MG/50ML-% IV SOLN: 20.0000 mg | Freq: Once | INTRAVENOUS | Status: DC | PRN

## 2021-09-24 MED ORDER — DIPHENHYDRAMINE HCL 25 MG PO CAPS
50.0000 mg | ORAL_CAPSULE | Freq: Once | ORAL | Status: AC
  Administered 2021-09-24: 50 mg via ORAL

## 2021-09-24 MED ORDER — ALBUTEROL SULFATE HFA 108 (90 BASE) MCG/ACT IN AERS: 2.0000 | INHALATION_SPRAY | Freq: Once | RESPIRATORY_TRACT | Status: DC | PRN

## 2021-09-24 MED ORDER — DIPHENHYDRAMINE HCL 50 MG/ML IJ SOLN: 50.0000 mg | Freq: Once | INTRAMUSCULAR | Status: DC | PRN

## 2021-09-24 MED ORDER — METHYLPREDNISOLONE SODIUM SUCC 125 MG IJ SOLR: 125.0000 mg | Freq: Once | INTRAMUSCULAR | Status: DC | PRN

## 2021-09-24 MED ORDER — SODIUM CHLORIDE 0.9 % IV SOLN: Freq: Once | INTRAVENOUS | Status: DC | PRN

## 2021-09-24 MED ORDER — EPINEPHRINE 0.3 MG/0.3ML IJ SOAJ: 0.3000 mg | Freq: Once | INTRAMUSCULAR | Status: DC | PRN

## 2021-09-24 NOTE — Telephone Encounter (Signed)
SECOND ATTEMPT:  LVM for pt to return call. In addition, still awaiting response from Dr. Tarri Glenn re: Sheila Vincent denial. Given denial and worsening symptoms, prednisone taper Rx sent 11/21.

## 2021-09-24 NOTE — Progress Notes (Signed)
Diagnosis: Iron Deficiency Anemia  Provider:  Marshell Garfinkel, MD  Procedure: Infusion  IV Type: Peripheral, IV Location: L Forearm  Venofer (Iron Sucrose), Dose: 200 mg  Infusion Start Time: 2755 am  Infusion Stop Time: 1031 am  Post Infusion IV Care: Observation period completed and Peripheral IV Discontinued  Discharge: Condition: Good, Destination: Home . AVS provided to patient.   Performed by:  Oren Beckmann, RN

## 2021-09-24 NOTE — Telephone Encounter (Signed)
Pt returned call. Informed about recommendations and orders as documented below. Pt also informed about denial of Lialda. Advised I am awaiting a response from Dr. Tarri Glenn re: a covered alternative. Pt expressed frustration stating, "I cannot afford the medication y'all keep ordering" further adding, "and I can't keep repeating the same stool sample and nothing being resolved." Pt adds that she feels she keeps getting worse and nothing is making her fell better. Reassured I understand her frustrations and reminded her that when she presents with worsening symptoms, providers can only give recommendations based on the information provided, both currently and based on her past medical history. The best course of treatment IS being provided with each and every encounter. Reminded that we are merely providing her with all the information, recommendations and tools needed based on her provider's medical expertise and to best treat her concerns/symptoms. Advised it is ultimately her decision regarding how SHE wishes to proceed with the information provided. We are here to support whatever decision she chooses to make, and do not wish to provide a disservice by NOT giving her all the information SHE needs to make an informed decision about HER own care. Pt states, "this is the last stool specimen I am providing", further stating, "and I will not buy anymore medications until I get paid." Pt reminded that the Rx's were sent to her pharmacy and will remain available until she is ready to pick them up. Routing this message to Dr. Tarri Glenn and Jaclyn Shaggy to make them aware of this conversation.

## 2021-09-24 NOTE — Telephone Encounter (Signed)
DENIAL  Medication: [place]: [employer] PA response: DENIED Rationale: Must have tried and failed sulfasalazine and balsalazide Misc. Notes: May proceed with appeal but will need supporting rationale for why the preferred drug(s) would not work for the pt. In addition, ONLY mesalamine 0.375 gram is preferred AND with quantity limits. Must provide supporting rationale for exceeding qty limits  Routing this message to Dr. Tarri Glenn for her to review and address.

## 2021-09-25 ENCOUNTER — Other Ambulatory Visit: Payer: Medicare HMO

## 2021-09-25 DIAGNOSIS — K51011 Ulcerative (chronic) pancolitis with rectal bleeding: Secondary | ICD-10-CM

## 2021-09-25 DIAGNOSIS — K529 Noninfective gastroenteritis and colitis, unspecified: Secondary | ICD-10-CM

## 2021-09-25 NOTE — Telephone Encounter (Signed)
Pt is aware of need to provide me with covered alternatives to Lialda. However, as previously stated, pt cannot afford any medications at this time and does not intend to discuss covered alternatives with insurance carrier at this time. Rx for Prednisone has also been sent but as pt previously stated, she does not intend to pick up this Rx either as she cannot afford medications. Pt is aware to proceed to ED if her symptoms worsen but still expressed her frustration that nothing that has been recommended has resolved her symptoms. Pt declined my offer to be referred to [place] but if she changes her mind, she will call me to let me know. Phone call immediately disconnected.

## 2021-09-27 ENCOUNTER — Other Ambulatory Visit: Payer: Self-pay | Admitting: Family Medicine

## 2021-09-30 ENCOUNTER — Ambulatory Visit (INDEPENDENT_AMBULATORY_CARE_PROVIDER_SITE_OTHER): Payer: Medicare HMO

## 2021-09-30 DIAGNOSIS — J309 Allergic rhinitis, unspecified: Secondary | ICD-10-CM | POA: Diagnosis not present

## 2021-10-01 LAB — CALPROTECTIN, FECAL: Calprotectin, Fecal: 1469 ug/g — ABNORMAL HIGH (ref 0–120)

## 2021-10-11 ENCOUNTER — Ambulatory Visit (INDEPENDENT_AMBULATORY_CARE_PROVIDER_SITE_OTHER): Payer: Medicare HMO | Admitting: Gastroenterology

## 2021-10-11 ENCOUNTER — Encounter: Payer: Self-pay | Admitting: Gastroenterology

## 2021-10-11 VITALS — BP 120/78 | HR 92 | Ht 62.0 in | Wt 149.4 lb

## 2021-10-11 DIAGNOSIS — K529 Noninfective gastroenteritis and colitis, unspecified: Secondary | ICD-10-CM | POA: Diagnosis not present

## 2021-10-11 DIAGNOSIS — K51011 Ulcerative (chronic) pancolitis with rectal bleeding: Secondary | ICD-10-CM

## 2021-10-11 DIAGNOSIS — D509 Iron deficiency anemia, unspecified: Secondary | ICD-10-CM

## 2021-10-11 MED ORDER — SULFASALAZINE 500 MG PO TBEC: 500.0000 mg | DELAYED_RELEASE_TABLET | Freq: Two times a day (BID) | ORAL | 3 refills | Status: DC

## 2021-10-11 NOTE — Telephone Encounter (Signed)
Addressed during office visit today.
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
## 2021-10-11 NOTE — Patient Instructions (Signed)
If you are age 62 or older, your body mass index should be between 23-30. Your Body mass index is 27.32 kg/m. If this is out of the aforementioned range listed, please consider follow up with your Primary Care Provider.  If you are age 63 or younger, your body mass index should be between 19-25. Your Body mass index is 27.32 kg/m. If this is out of the aformentioned range listed, please consider follow up with your Primary Care Provider.   ________________________________________________________  The [facility] providers would like to encourage you to use [facility] to communicate with providers for non-urgent requests or questions.  Due to long hold times on the telephone, sending your provider a message by [facility] may be a faster and more efficient way to get a response.  Please allow 48 business hours for a response.  Please remember that this is for non-urgent requests. . _______________________________________________________  Start the sulfasalazine 562m, twice a day, increase to 2 twice a day if needed.  It was a pleasure to see you today!  Thank you for trusting me with your gastrointestinal care!

## 2021-10-13 ENCOUNTER — Other Ambulatory Visit: Payer: Self-pay | Admitting: Orthopaedic Surgery

## 2021-10-14 ENCOUNTER — Other Ambulatory Visit: Payer: Self-pay

## 2021-10-14 ENCOUNTER — Encounter (HOSPITAL_BASED_OUTPATIENT_CLINIC_OR_DEPARTMENT_OTHER): Payer: Self-pay | Admitting: Urology

## 2021-10-14 ENCOUNTER — Telehealth: Payer: Self-pay | Admitting: Gastroenterology

## 2021-10-14 NOTE — Progress Notes (Addendum)
Spoke w/ via phone for pre-op interview---pt Lab needs dos----  I stat (gent) Covid test --patient states asymptomatic no test needed Arrive at -------1100 am 10-18-2021 NPO after MN NO Solid Food.  Clear liquids from MN until---1000 am Med rec completed Medications to take morning of surgery ----- symbicort, montelukast, certrizine, omeprazole, use prn/bring albuterol inhaler, oxycodone prn Diabetic medication -----n/a Patient instructed no nail polish to be worn day of surgery Patient instructed to bring photo id and insurance card day of surgery Patient aware to have Driver (ride ) / caregiver    for 24 hours after surgery butch [place] friend Patient Special Instructions -----none Pre-Op special Istructions -----none Patient verbalized understanding of instructions that were given at this phone interview. Patient denies shortness of breath, chest pain, fever, cough at this phone interview.   Chest xray 05-27-2021 epic Ekg 05-27-2021 chart/epic  Lov dr Narda Rutherford hematology/oncology 08-13-2021 epic

## 2021-10-15 ENCOUNTER — Other Ambulatory Visit: Payer: Self-pay

## 2021-10-15 DIAGNOSIS — K529 Noninfective gastroenteritis and colitis, unspecified: Secondary | ICD-10-CM

## 2021-10-15 MED ORDER — MESALAMINE 1.2 G PO TBEC: 4.8000 g | DELAYED_RELEASE_TABLET | Freq: Every day | ORAL | 2 refills | Status: DC

## 2021-10-15 NOTE — Telephone Encounter (Signed)
New Rx sent for Lialda. If PA required, will await initiation from pharmacy. Sulfasalazine d/c'd and added to allergy list.

## 2021-10-17 NOTE — Telephone Encounter (Signed)
Inbound call from patient states PA is needed for Lialda

## 2021-10-17 NOTE — Telephone Encounter (Signed)
Routing this message to Dr. Tarri Glenn to determine if there is another alternative pt is able to try.  DENIAL  Medication: [employer]: [employer] PA response: Denied Rationale: Must have failed Balsalazide, Sulfasalazine (allergy), Asacol, Delzicol and Pentasa (cannot afford) Misc. NotesDARICA Vincent Key: [id number] - PA Case ID: [id number] Need help? Call us at [phone] Outcome Additional Information Required There is an EOC that was clinically denied within the last 60 days. This request needs to be sent to the Grievance and [employer]. at [facility]. Appeal requests must come from the member or the provider. Drug Lialda 1.2GM dr tablets Form [employer] Electronic PA Form   Lialda Patient Assistance Form completed on-line for [employer] [phone]  Thank you for submitting your application. You will be hearing from one of our team members shortly. If you want to speak with a representative right now then contact our team at [phone]. Thank you and we look forward to helping you!

## 2021-10-18 ENCOUNTER — Ambulatory Visit (HOSPITAL_BASED_OUTPATIENT_CLINIC_OR_DEPARTMENT_OTHER): Payer: Medicare HMO | Admitting: Anesthesiology

## 2021-10-18 ENCOUNTER — Encounter (HOSPITAL_BASED_OUTPATIENT_CLINIC_OR_DEPARTMENT_OTHER)
Admission: RE | Disposition: A | Discharge status: Home / Self Care | Payer: Self-pay | Source: Home / Self Care | Attending: Urology

## 2021-10-18 ENCOUNTER — Other Ambulatory Visit: Payer: Self-pay | Admitting: *Deleted

## 2021-10-18 ENCOUNTER — Encounter: Payer: Self-pay | Admitting: Gastroenterology

## 2021-10-18 ENCOUNTER — Ambulatory Visit (HOSPITAL_BASED_OUTPATIENT_CLINIC_OR_DEPARTMENT_OTHER)
Admission: RE | Admit: 2021-10-18 | Discharge: 2021-10-18 | Disposition: A | Discharge status: Home / Self Care | Payer: Medicare HMO | Attending: Urology | Admitting: Urology

## 2021-10-18 ENCOUNTER — Encounter (HOSPITAL_BASED_OUTPATIENT_CLINIC_OR_DEPARTMENT_OTHER): Payer: Self-pay | Admitting: Urology

## 2021-10-18 DIAGNOSIS — D500 Iron deficiency anemia secondary to blood loss (chronic): Secondary | ICD-10-CM

## 2021-10-18 DIAGNOSIS — I10 Essential (primary) hypertension: Secondary | ICD-10-CM | POA: Insufficient documentation

## 2021-10-18 DIAGNOSIS — N201 Calculus of ureter: Secondary | ICD-10-CM | POA: Diagnosis present

## 2021-10-18 DIAGNOSIS — Z79899 Other long term (current) drug therapy: Secondary | ICD-10-CM | POA: Diagnosis not present

## 2021-10-18 DIAGNOSIS — K219 Gastro-esophageal reflux disease without esophagitis: Secondary | ICD-10-CM | POA: Diagnosis not present

## 2021-10-18 HISTORY — DX: Personal history of urinary calculi: Z87.442

## 2021-10-18 HISTORY — DX: Personal history of COVID-19: Z86.16

## 2021-10-18 HISTORY — PX: CYSTOSCOPY WITH RETROGRADE PYELOGRAM, URETEROSCOPY AND STENT PLACEMENT: SHX5789

## 2021-10-18 HISTORY — DX: Noninfective gastroenteritis and colitis, unspecified: K52.9

## 2021-10-18 LAB — POCT I-STAT, CHEM 8
BUN: 9 mg/dL (ref 8–23)
Calcium, Ion: 1.22 mmol/L (ref 1.15–1.40)
Chloride: 102 mmol/L (ref 98–111)
Creatinine, Ser: 0.6 mg/dL (ref 0.44–1.00)
Glucose, Bld: 123 mg/dL — ABNORMAL HIGH (ref 70–99)
HCT: 46 % (ref 36.0–46.0)
Hemoglobin: 15.6 g/dL — ABNORMAL HIGH (ref 12.0–15.0)
Potassium: 4.1 mmol/L (ref 3.5–5.1)
Sodium: 140 mmol/L (ref 135–145)
TCO2: 27 mmol/L (ref 22–32)

## 2021-10-18 SURGERY — CYSTOURETEROSCOPY, WITH RETROGRADE PYELOGRAM AND STENT INSERTION
Anesthesia: General | Site: Ureter | Laterality: Right

## 2021-10-18 MED ORDER — FENTANYL CITRATE (PF) 100 MCG/2ML IJ SOLN
INTRAMUSCULAR | Status: DC | PRN
  Administered 2021-10-18 (×2): 50 ug via INTRAVENOUS

## 2021-10-18 MED ORDER — GENTAMICIN SULFATE 40 MG/ML IJ SOLN
5.0000 mg/kg | INTRAVENOUS | Status: AC
  Administered 2021-10-18: 290 mg via INTRAVENOUS
  Filled 2021-10-18: qty 7.25

## 2021-10-18 MED ORDER — ONDANSETRON HCL 4 MG/2ML IJ SOLN
INTRAMUSCULAR | Status: AC
  Filled 2021-10-18: qty 2

## 2021-10-18 MED ORDER — FENTANYL CITRATE (PF) 100 MCG/2ML IJ SOLN
INTRAMUSCULAR | Status: AC
  Filled 2021-10-18: qty 2

## 2021-10-18 MED ORDER — MIDAZOLAM HCL 2 MG/2ML IJ SOLN
INTRAMUSCULAR | Status: AC
  Filled 2021-10-18: qty 2

## 2021-10-18 MED ORDER — DEXAMETHASONE SODIUM PHOSPHATE 4 MG/ML IJ SOLN
INTRAMUSCULAR | Status: DC | PRN
  Administered 2021-10-18: 5 mg via INTRAVENOUS

## 2021-10-18 MED ORDER — PERCOCET 5-325 MG PO TABS: 1.0000 | ORAL_TABLET | Freq: Four times a day (QID) | ORAL | 0 refills | Status: DC | PRN

## 2021-10-18 MED ORDER — FENTANYL CITRATE (PF) 100 MCG/2ML IJ SOLN: 25.0000 ug | INTRAMUSCULAR | Status: DC | PRN

## 2021-10-18 MED ORDER — IOHEXOL 300 MG/ML  SOLN
INTRAMUSCULAR | Status: DC | PRN
  Administered 2021-10-18: 15 mL

## 2021-10-18 MED ORDER — LACTATED RINGERS IV SOLN: INTRAVENOUS | Status: DC

## 2021-10-18 MED ORDER — PROPOFOL 10 MG/ML IV BOLUS
INTRAVENOUS | Status: DC | PRN
  Administered 2021-10-18: 120 mg via INTRAVENOUS

## 2021-10-18 MED ORDER — PROPOFOL 10 MG/ML IV BOLUS
INTRAVENOUS | Status: AC
  Filled 2021-10-18: qty 20

## 2021-10-18 MED ORDER — ACETAMINOPHEN 500 MG PO TABS
ORAL_TABLET | ORAL | Status: AC
  Filled 2021-10-18: qty 2

## 2021-10-18 MED ORDER — LIDOCAINE HCL (CARDIAC) PF 100 MG/5ML IV SOSY
PREFILLED_SYRINGE | INTRAVENOUS | Status: DC | PRN
  Administered 2021-10-18: 60 mg via INTRAVENOUS

## 2021-10-18 MED ORDER — MIDAZOLAM HCL 5 MG/5ML IJ SOLN
INTRAMUSCULAR | Status: DC | PRN
  Administered 2021-10-18: 1 mg via INTRAVENOUS

## 2021-10-18 MED ORDER — LIDOCAINE 2% (20 MG/ML) 5 ML SYRINGE
INTRAMUSCULAR | Status: AC
  Filled 2021-10-18: qty 5

## 2021-10-18 MED ORDER — PHENYLEPHRINE HCL (PRESSORS) 10 MG/ML IV SOLN
INTRAVENOUS | Status: DC | PRN
  Administered 2021-10-18 (×5): 80 ug via INTRAVENOUS

## 2021-10-18 MED ORDER — ONDANSETRON HCL 4 MG/2ML IJ SOLN
INTRAMUSCULAR | Status: DC | PRN
  Administered 2021-10-18: 4 mg via INTRAVENOUS

## 2021-10-18 MED ORDER — ACETAMINOPHEN 500 MG PO TABS
1000.0000 mg | ORAL_TABLET | Freq: Once | ORAL | Status: AC
  Administered 2021-10-18: 1000 mg via ORAL

## 2021-10-18 MED ORDER — DEXAMETHASONE SODIUM PHOSPHATE 10 MG/ML IJ SOLN
INTRAMUSCULAR | Status: AC
  Filled 2021-10-18: qty 1

## 2021-10-18 MED ORDER — KETOROLAC TROMETHAMINE 30 MG/ML IJ SOLN
INTRAMUSCULAR | Status: AC
  Filled 2021-10-18: qty 1

## 2021-10-18 MED ORDER — SODIUM CHLORIDE 0.9 % IR SOLN
Status: DC | PRN
  Administered 2021-10-18: 3000 mL via INTRAVESICAL

## 2021-10-18 SURGICAL SUPPLY — 26 items
BAG DRAIN URO-CYSTO SKYTR STRL (DRAIN) ×5 IMPLANT
BAG DRN UROCATH (DRAIN) ×2
BASKET LASER NITINOL 1.9FR (BASKET) IMPLANT
BSKT STON RTRVL 120 1.9FR (BASKET)
CATH INTERMIT  6FR 70CM (CATHETERS) ×3 IMPLANT
CLOTH BEACON ORANGE TIMEOUT ST (SAFETY) ×5 IMPLANT
FIBER LASER FLEXIVA 365 (UROLOGICAL SUPPLIES) IMPLANT
GLOVE SURG ENC MOIS LTX SZ7.5 (GLOVE) ×5 IMPLANT
GOWN STRL REUS W/TWL LRG LVL3 (GOWN DISPOSABLE) ×5 IMPLANT
GUIDEWIRE ANG ZIPWIRE 038X150 (WIRE) ×5 IMPLANT
GUIDEWIRE STR DUAL SENSOR (WIRE) ×5 IMPLANT
IV NS 1000ML (IV SOLUTION) ×4
IV NS 1000ML BAXH (IV SOLUTION) ×3 IMPLANT
IV NS IRRIG 3000ML ARTHROMATIC (IV SOLUTION) ×5 IMPLANT
KIT TURNOVER CYSTO (KITS) ×5 IMPLANT
MANIFOLD NEPTUNE II (INSTRUMENTS) ×5 IMPLANT
NS IRRIG 500ML POUR BTL (IV SOLUTION) ×5 IMPLANT
PACK CYSTO (CUSTOM PROCEDURE TRAY) ×5 IMPLANT
STENT POLARIS 5FRX22 (STENTS) ×3 IMPLANT
SYR 10ML LL (SYRINGE) ×5 IMPLANT
TRACTIP FLEXIVA PULS ID 200XHI (Laser) IMPLANT
TRACTIP FLEXIVA PULSE ID 200 (Laser)
TUBE CONNECTING 12'X1/4 (SUCTIONS) ×1
TUBE CONNECTING 12X1/4 (SUCTIONS) ×4 IMPLANT
TUBE FEEDING 8FR 16IN STR KANG (MISCELLANEOUS) ×3 IMPLANT
TUBING UROLOGY SET (TUBING) ×5 IMPLANT

## 2021-10-18 NOTE — Brief Op Note (Signed)
10/18/2021  12:46 PM  PATIENT:  Sheila Vincent  60 y.o. female  PRE-OPERATIVE DIAGNOSIS:  RIGHT URETERAL STONE  POST-OPERATIVE DIAGNOSIS:  RIGHT URETERAL STONE passed  PROCEDURE:  Procedure(s): CYSTOSCOPY WITH RETROGRADE PYELOGRAM, URETEROSCOPY AND STENT PLACEMENT (Right)  SURGEON:  Surgeon(s) and Role:    * Alexis Frock, MD - Primary  PHYSICIAN ASSISTANT:   ASSISTANTS: none   ANESTHESIA:   general  EBL:  minimal  BLOOD ADMINISTERED:none  DRAINS: none   LOCAL MEDICATIONS USED:  NONE  SPECIMEN:  No Specimen  DISPOSITION OF SPECIMEN:  N/A  COUNTS:  YES  TOURNIQUET:  * No tourniquets in log *  DICTATION: .Other Dictation: Dictation Number [id number]  PLAN OF CARE: Discharge to home after PACU  PATIENT DISPOSITION:  PACU - hemodynamically stable.   Delay start of Pharmacological VTE agent (>24hrs) due to surgical blood loss or risk of bleeding: not applicable

## 2021-10-18 NOTE — Anesthesia Preprocedure Evaluation (Addendum)
Anesthesia Evaluation  Patient identified by MRN, date of birth, ID band Patient awake    Reviewed: Allergy & Precautions, H&P , NPO status , Patient's Chart, lab work & pertinent test results  Airway Mallampati: II  TM Distance: >3 FB Neck ROM: Full    Dental no notable dental hx. (+) Teeth Intact, Dental Advisory Given   Pulmonary asthma ,    Pulmonary exam normal breath sounds clear to auscultation       Cardiovascular hypertension, Pt. on medications  Rhythm:Regular Rate:Normal + Systolic murmurs    Neuro/Psych Anxiety Depression negative neurological ROS     GI/Hepatic Neg liver ROS, GERD  Medicated,  Endo/Other  negative endocrine ROS  Renal/GU negative Renal ROS  negative genitourinary   Musculoskeletal  (+) Arthritis , Osteoarthritis,    Abdominal   Peds  Hematology  (+) Blood dyscrasia, anemia ,   Anesthesia Other Findings   Reproductive/Obstetrics negative OB ROS                            Anesthesia Physical Anesthesia Plan  ASA: 2  Anesthesia Plan: General   Post-op Pain Management: Tylenol PO (pre-op) and Toradol IV (intra-op)   Induction: Intravenous  PONV Risk Score and Plan: 4 or greater and Ondansetron, Dexamethasone and Midazolam  Airway Management Planned: LMA  Additional Equipment:   Intra-op Plan:   Post-operative Plan: Extubation in OR  Informed Consent: I have reviewed the patients History and Physical, chart, labs and discussed the procedure including the risks, benefits and alternatives for the proposed anesthesia with the patient or authorized representative who has indicated his/her understanding and acceptance.     Dental advisory given  Plan Discussed with: CRNA  Anesthesia Plan Comments:        Anesthesia Quick Evaluation
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
## 2021-10-18 NOTE — Discharge Instructions (Addendum)
1 - You may have urinary urgency (bladder spasms) and bloody urine on / off with stent in place. This is normal.  2 - Call MD or go to ER for fever >102, severe pain / nausea / vomiting not relieved by medications, or acute change in medical status  Post Anesthesia Home Care Instructions  Activity: Get plenty of rest for the remainder of the day. A responsible individual must stay with you for 24 hours following the procedure.  For the next 24 hours, DO NOT: -Drive a car -Paediatric nurse -Drink alcoholic beverages -Take any medication unless instructed by your physician -Make any legal decisions or sign important papers.  Meals: Start with liquid foods such as gelatin or soup. Progress to regular foods as tolerated. Avoid greasy, spicy, heavy foods. If nausea and/or vomiting occur, drink only clear liquids until the nausea and/or vomiting subsides. Call your physician if vomiting continues.  Special Instructions/Symptoms: Your throat may feel dry or sore from the anesthesia or the breathing tube placed in your throat during surgery. If this causes discomfort, gargle with warm salt water. The discomfort should disappear within 24 hours.  If you had a scopolamine patch placed behind your ear for the management of post- operative nausea and/or vomiting:  1. The medication in the patch is effective for 72 hours, after which it should be removed.  Wrap patch in a tissue and discard in the trash. Wash hands thoroughly with soap and water. 2. You may remove the patch earlier than 72 hours if you experience unpleasant side effects which may include dry mouth, dizziness or visual disturbances. 3. Avoid touching the patch. Wash your hands with soap and water after contact with the patch.     Next dose of Tylenol/acetaminophen after 5:45 pm today if needed.

## 2021-10-18 NOTE — Anesthesia Postprocedure Evaluation (Signed)
Anesthesia Post Note  Patient: Sheila Vincent  Procedure(s) Performed: CYSTOSCOPY WITH RETROGRADE PYELOGRAM, URETEROSCOPY AND STENT PLACEMENT (Right: Ureter)     Patient location during evaluation: PACU Anesthesia Type: General Level of consciousness: awake and alert Pain management: pain level controlled Vital Signs Assessment: post-procedure vital signs reviewed and stable Respiratory status: spontaneous breathing, nonlabored ventilation and respiratory function stable Cardiovascular status: blood pressure returned to baseline and stable Postop Assessment: no apparent nausea or vomiting Anesthetic complications: no   No notable events documented.  Last Vitals:  Vitals:   10/18/21 1330 10/18/21 1345  BP: 125/65 125/63  Pulse: 79 79  Resp: (!) 25 19  Temp:    SpO2: 96% 96%    Last Pain:  Vitals:   10/18/21 1345  TempSrc:   PainSc: 0-No pain                 [name],W. EDMOND

## 2021-10-18 NOTE — Transfer of Care (Signed)
Immediate Anesthesia Transfer of Care Note  Patient: Sheila Vincent  Procedure(s) Performed: CYSTOSCOPY WITH RETROGRADE PYELOGRAM, URETEROSCOPY AND STENT PLACEMENT (Right: Ureter)  Patient Location: PACU  Anesthesia Type:General  Level of Consciousness: awake, alert  and oriented  Airway & Oxygen Therapy: Patient Spontanous Breathing and Patient connected to nasal cannula oxygen  Post-op Assessment: Report given to RN and Post -op Vital signs reviewed and stable  Post vital signs: Reviewed and stable  Last Vitals:  Vitals Value Taken Time  BP    Temp    Pulse 79 10/18/21 1256  Resp 29 10/18/21 1256  SpO2 99 % 10/18/21 1256  Vitals shown include unvalidated device data.  Last Pain:  Vitals:   10/18/21 1120  TempSrc: Oral  PainSc: 4       Patients Stated Pain Goal: 5 (16/10/96 0454)  Complications: No notable events documented.

## 2021-10-18 NOTE — Anesthesia Procedure Notes (Signed)
Procedure Name: LMA Insertion Date/Time: 10/18/2021 12:22 PM Performed by: Bufford Spikes, CRNA Pre-anesthesia Checklist: Patient identified, Emergency Drugs available, Suction available and Patient being monitored Patient Re-evaluated:Patient Re-evaluated prior to induction Oxygen Delivery Method: Circle system utilized Preoxygenation: Pre-oxygenation with 100% oxygen Induction Type: IV induction Ventilation: Mask ventilation without difficulty LMA: LMA inserted LMA Size: 4.0 Number of attempts: 1 Placement Confirmation: positive ETCO2 Tube secured with: Tape Dental Injury: Teeth and Oropharynx as per pre-operative assessment

## 2021-10-19 NOTE — Op Note (Signed)
NAMEMARNAE, MADANI MEDICAL RECORD NO: [id number] ACCOUNT NO: [id number] DATE OF BIRTH: 1960-12-26 FACILITY: [facility] LOCATION: [facility] PHYSICIAN: Alexis Frock, MD  Operative Report   PREOPERATIVE DIAGNOSIS: Right distal ureteral stone, refractory colic.  POSTOPERATIVE DIAGNOSES: 1.  Right ureteral stone, now passed. 2.  Likely slight right UPJ obstruction.  ESTIMATED BLOOD LOSS:  Nil.  COMPLICATIONS:  None.  SPECIMENS:  None.  FINDINGS: 1.  Interval passage of right distal ureteral stone. 2.  Likely partial right UPJ obstruction and very mild hydronephrosis.  No intraluminal lesions.  Obstruction noted. 3.  Distal right ureteral edema consistent with likely interval stone passage.  PROCEDURE:  Cystoscopy, placement of right ureteral stent, proximal end in renal pelvis, distal end in urinary bladder.  INDICATIONS:  The patient is a pleasant 60 year old lady who was found on workup of colicky flank pain to have right distal ureteral stone that only of 2 mm.  There appeared to be some chronic dilation of the kidney and ureter to this level, suggesting  some chronicity of the stone.  She was given a trial of medical therapy and her colic remained variable.  She is very compliant with urine straining, but reports no interval passage.  Options were discussed including continued medical therapy versus  ureteroscopy with goal of stone free and she wished to proceed with the latter.  Informed consent was signed and placed in medical record.  PROCEDURE IN DETAIL:  The patient being verified and the procedure being right ureteroscopic stone laser was confirmed.  Procedure timeout was performed.  Intravenous antibiotics administered.  General anesthesia was induced.  The patient was placed into  a low lithotomy position.  Sterile field was created, prepped the patient's vagina, introitus and proximal thighs using iodine.  Cystourethroscopy was performed using 21-French rigid cystoscope with  offset lens.  Inspection of the urinary bladder  revealed no diverticula, calcifications or papillary lesions.  Ureteral orifices appeared singleton.  The right ureteral orifice was cannulated with a 6-French end-hole catheter and a right retrograde pyelogram was obtained.  Right retrograde pyelogram demonstrated a single right ureter, single system right kidney.  There was no ureteronephrosis noted or obvious filling defects.  There was some fullness of the right renal pelvis with a mild drooping early sign consistent with  likely partial subclinical right UPJ obstruction.  A 0.038 ZIPwire was advanced to the level of lower pole and set aside as a safety wire.  An 8-French feeding tube was placed in the urinary bladder for pressure release.  Semirigid ureteroscopy was  performed of distal four-fifths of right ureter alongside a separate sensor working wire.  In the distal ureter, there was area of mucosal edema noted that was quite significant, but no evidence of an intraluminal stone whatsoever.  This was ____ likely  consistent with interval stone passage versus retrograde positioning of stone and the semirigid scope was exchanged for a dual channel flexible digital ureteroscope using fluoroscopic guidance over the sensor working wire to the level of the kidney.   Systematic inspection including kidney and all calices x3 revealed no evidence of intraluminal stone whatsoever.  The scope was withdrawn under direct vision and again no evidence of stones seen.  The area of the UPJ was carefully inspected.  There was  no intraluminal narrowing here or obvious large pulsatile mucosal folds. Given the distal ureteral edema, it was felt that interval stenting will be warranted as the patient does still have some colicky symptoms likely from resolving edema and a new  5 x  22 Polaris type stent was placed using fluoroscopic guidance.  Good proximal and distal planes were noted.  Procedure was terminated.  The  patient tolerated the procedure well.  No immediate periprocedural complications.  Patient was taken to  postanesthesia care in stable condition.  Plan for discharge home and stent removal and at that time, I will follow up in the office cystoscopically.   NIK D: 10/18/2021 12:52:29 pm T: 10/19/2021 2:28:00 am  JOB: [id number]/ [id number]

## 2021-10-21 ENCOUNTER — Other Ambulatory Visit: Payer: Self-pay

## 2021-10-21 ENCOUNTER — Inpatient Hospital Stay: Payer: Medicare HMO

## 2021-10-21 ENCOUNTER — Encounter (HOSPITAL_BASED_OUTPATIENT_CLINIC_OR_DEPARTMENT_OTHER): Payer: Self-pay | Admitting: Urology

## 2021-10-21 ENCOUNTER — Inpatient Hospital Stay: Payer: Medicare HMO | Attending: Hematology and Oncology | Admitting: Hematology and Oncology

## 2021-10-21 VITALS — BP 137/85 | HR 82 | Temp 96.9°F | Resp 18 | Ht 62.0 in | Wt 147.3 lb

## 2021-10-21 DIAGNOSIS — K529 Noninfective gastroenteritis and colitis, unspecified: Secondary | ICD-10-CM

## 2021-10-21 DIAGNOSIS — K5090 Crohn's disease, unspecified, without complications: Secondary | ICD-10-CM | POA: Diagnosis not present

## 2021-10-21 DIAGNOSIS — D500 Iron deficiency anemia secondary to blood loss (chronic): Secondary | ICD-10-CM | POA: Insufficient documentation

## 2021-10-21 DIAGNOSIS — R197 Diarrhea, unspecified: Secondary | ICD-10-CM

## 2021-10-21 LAB — RETIC PANEL
Immature Retic Fract: 5.2 % (ref 2.3–15.9)
RBC.: 5.31 MIL/uL — ABNORMAL HIGH (ref 3.87–5.11)
Retic Count, Absolute: 57.3 10*3/uL (ref 19.0–186.0)
Retic Ct Pct: 1.1 % (ref 0.4–3.1)
Reticulocyte Hemoglobin: 29.8 pg (ref >27.9)

## 2021-10-21 LAB — CBC WITH DIFFERENTIAL (CANCER CENTER ONLY)
Abs Immature Granulocytes: 0.03 10*3/uL (ref 0.00–0.07)
Basophils Absolute: 0.1 10*3/uL (ref 0.0–0.1)
Basophils Relative: 1 %
Eosinophils Absolute: 0.1 10*3/uL (ref 0.0–0.5)
Eosinophils Relative: 1 %
HCT: 42.9 % (ref 36.0–46.0)
Hemoglobin: 14.1 g/dL (ref 12.0–15.0)
Immature Granulocytes: 0 %
Lymphocytes Relative: 28 %
Lymphs Abs: 2.6 10*3/uL (ref 0.7–4.0)
MCH: 27.3 pg (ref 26.0–34.0)
MCHC: 32.9 g/dL (ref 30.0–36.0)
MCV: 83 fL (ref 80.0–100.0)
Monocytes Absolute: 0.7 10*3/uL (ref 0.1–1.0)
Monocytes Relative: 8 %
Neutro Abs: 5.9 10*3/uL (ref 1.7–7.7)
Neutrophils Relative %: 62 %
Platelet Count: 375 10*3/uL (ref 150–400)
RBC: 5.17 MIL/uL — ABNORMAL HIGH (ref 3.87–5.11)
RDW: 17.2 % — ABNORMAL HIGH (ref 11.5–15.5)
WBC Count: 9.4 10*3/uL (ref 4.0–10.5)
nRBC: 0 % (ref 0.0–0.2)

## 2021-10-21 LAB — CMP (CANCER CENTER ONLY)
ALT: 11 U/L (ref 0–44)
AST: 12 U/L — ABNORMAL LOW (ref 15–41)
Albumin: 3.8 g/dL (ref 3.5–5.0)
Alkaline Phosphatase: 114 U/L (ref 38–126)
Anion gap: 10 (ref 5–15)
BUN: 12 mg/dL (ref 8–23)
CO2: 25 mmol/L (ref 22–32)
Calcium: 9.3 mg/dL (ref 8.9–10.3)
Chloride: 104 mmol/L (ref 98–111)
Creatinine: 0.83 mg/dL (ref 0.44–1.00)
GFR, Estimated: >60 mL/min (ref >60)
Glucose, Bld: 136 mg/dL — ABNORMAL HIGH (ref 70–99)
Potassium: 4.3 mmol/L (ref 3.5–5.1)
Sodium: 139 mmol/L (ref 135–145)
Total Bilirubin: 0.3 mg/dL (ref 0.3–1.2)
Total Protein: 7.6 g/dL (ref 6.5–8.1)

## 2021-10-21 LAB — FERRITIN: Ferritin: 101 ng/mL (ref 11–307)

## 2021-10-21 LAB — IRON AND TIBC
Iron: 30 ug/dL — ABNORMAL LOW (ref 41–142)
Saturation Ratios: 10 % — ABNORMAL LOW (ref 21–57)
TIBC: 295 ug/dL (ref 236–444)
UIBC: 265 ug/dL (ref 120–384)

## 2021-10-21 MED ORDER — MESALAMINE ER 500 MG PO CPCR: 1000.0000 mg | ORAL_CAPSULE | Freq: Four times a day (QID) | ORAL | 0 refills | Status: DC
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
## 2021-10-21 NOTE — Telephone Encounter (Signed)
Lialda d/c'd per Dr. Tarri Glenn request and d/t insurance denial. Rx changed as below:  Outpatient Medication Detail   Disp Refills Start End   mesalamine (PENTASA) 500 MG CR capsule 240 capsule 0 10/21/2021    Sig - Route: Take 2 capsules (1,000 mg total) by mouth 4 (four) times daily. - Oral

## 2021-10-21 NOTE — Telephone Encounter (Signed)
PRIOR AUTHORIZATION  PA initiation date: 10/21/21  Medication: Pentasa 1g Insurance Company: [employer] completed electronically through [employer] My Meds: Yes  Will await insurance response re: approval/denial.  Sheila Vincent (Key: [id number])  Your information has been sent to [facility].

## 2021-10-21 NOTE — Telephone Encounter (Signed)
Called pt and LVM requesting returned call. 

## 2021-10-21 NOTE — Telephone Encounter (Signed)
APPROVAL  Medication: Pentasa Insurance Company: [employer] PA response: Approved Approval dates: 11/03/20 through 11/02/22 Misc. Notes: Sheila Vincent (Key: [id number])  This request has been approved.  Please note any additional information provided by [facility] at the bottom of your screen. PA Case: [id number], Status: Approved, Coverage Starts on: 11/03/2020 12:00:00 AM, Coverage Ends on: 11/02/2022 12:00:00 AM. Questions? Contact [phone].

## 2021-10-22 NOTE — Telephone Encounter (Signed)
SECOND ATTEMPT:  Called pt and informed her about new Rx and PA approval. Advised she call if she has any further issues or concerns. Verbalized acceptance and understanding.

## 2021-10-28 IMAGING — MR MR LUMBAR SPINE W/O CM
4 of 8 series · 18 of 48 positions shown · non-contrast
Comparison: MRI 05/01/2013

CLINICAL DATA: Low back pain, multiple recent falls. Right leg
weakness.

EXAM:
MRI LUMBAR SPINE WITHOUT CONTRAST
TECHNIQUE: Multiplanar, multisequence MR imaging of the lumbar spine was
performed. No intravenous contrast was administered.

[Series 5: T2 post-contrast · sagittal · 5.0mm · 0.51mm/px · 5 of 14 slices shown]
[im 1/14]
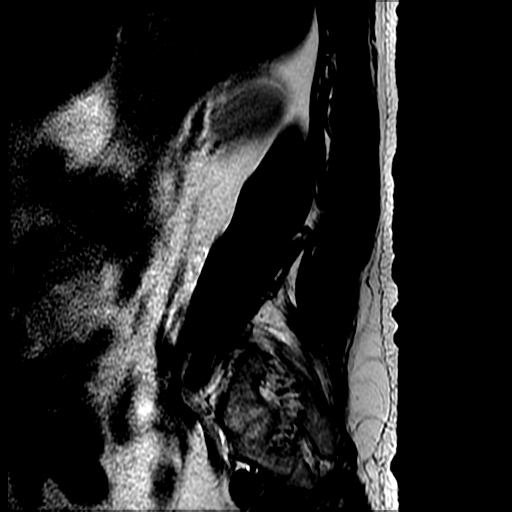
[im 4/14]
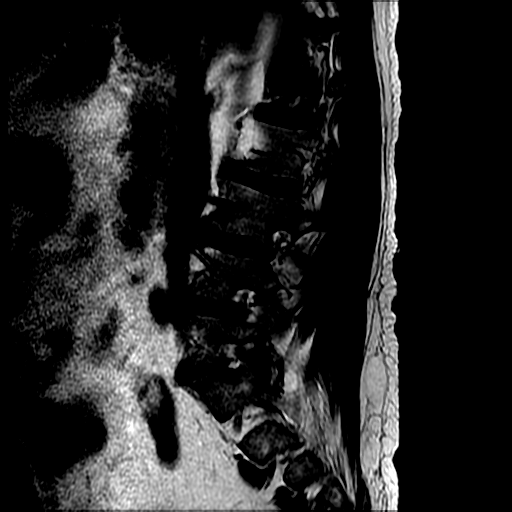
[im 7/14]
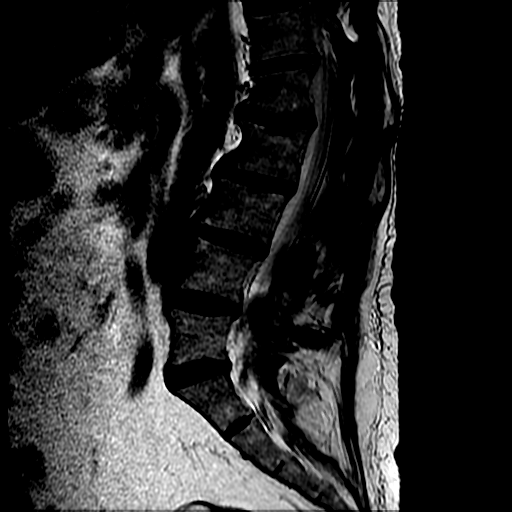
[im 10/14]
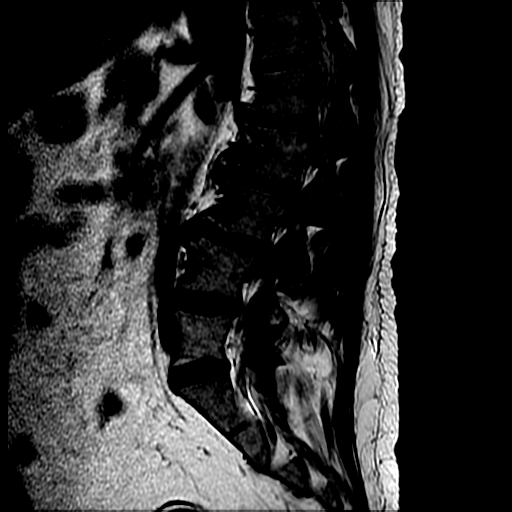
[im 14/14]
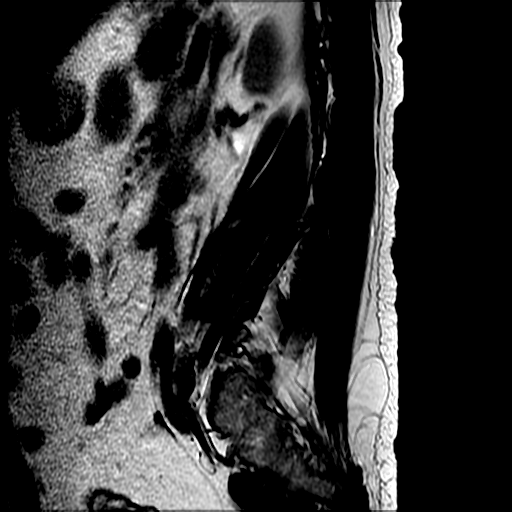

[Series 6: T1 · sagittal · 5.0mm · 0.51mm/px · 3 of 14 slices shown]
[im 1/14]
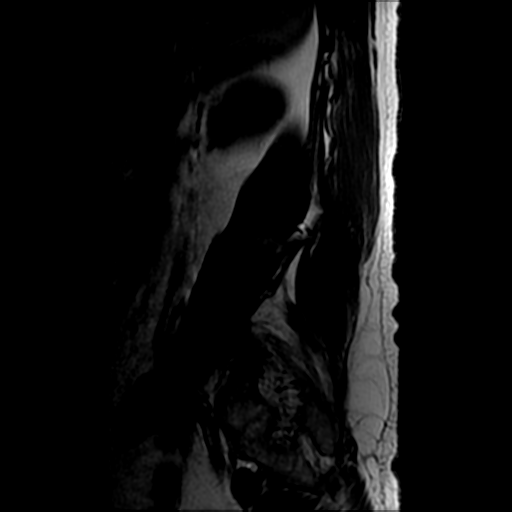
[im 9/14]
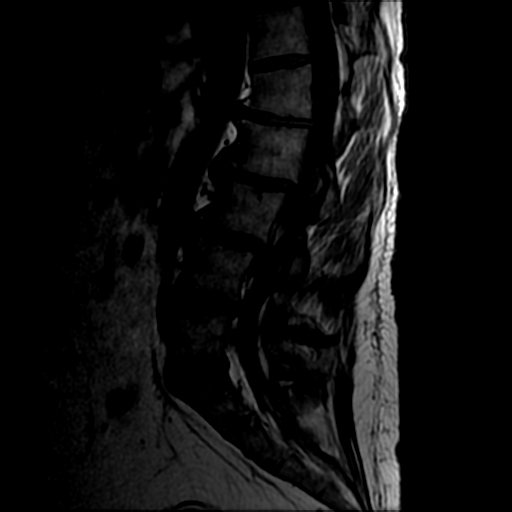
[im 14/14]
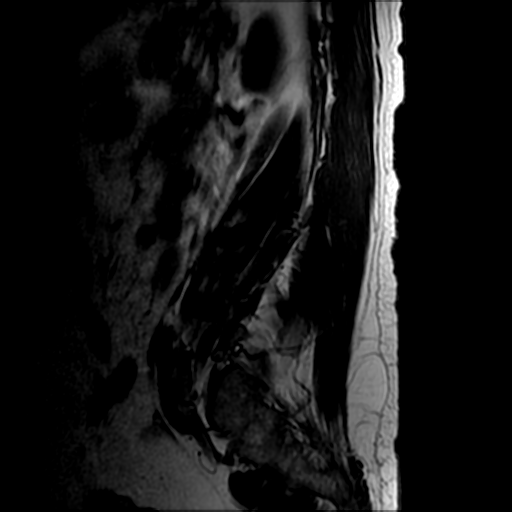

[Series 8: T2 · axial · 5.0mm · 0.39mm/px · z∈[-95,+103]mm · 7 of 36 slices shown (1 of 2)]
[im 1/36]
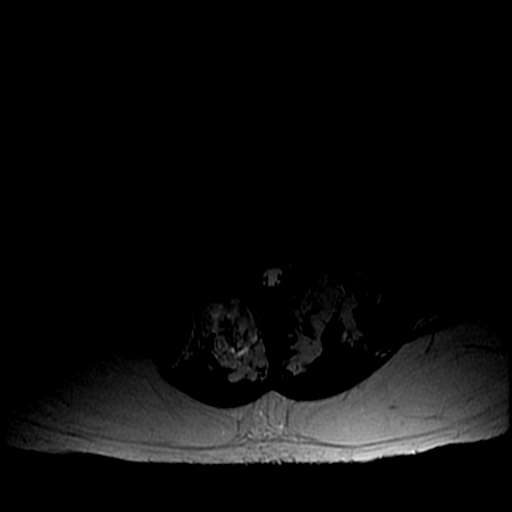
[im 4/36]
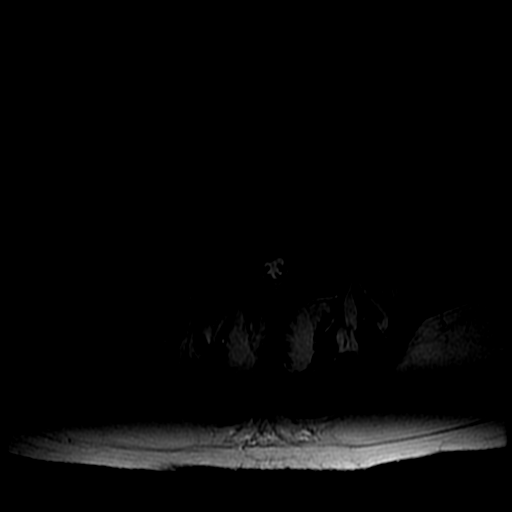
[im 12/36]
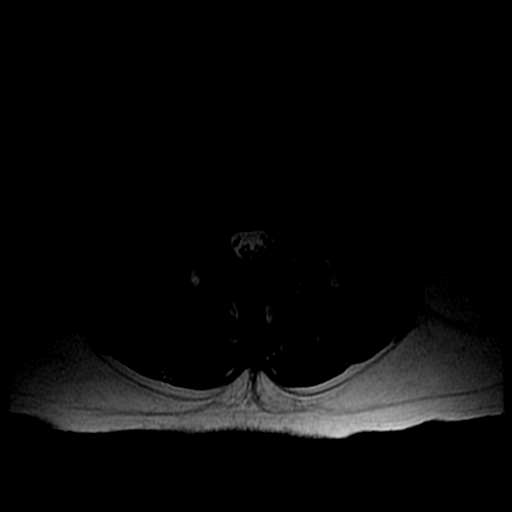
[im 16/36]
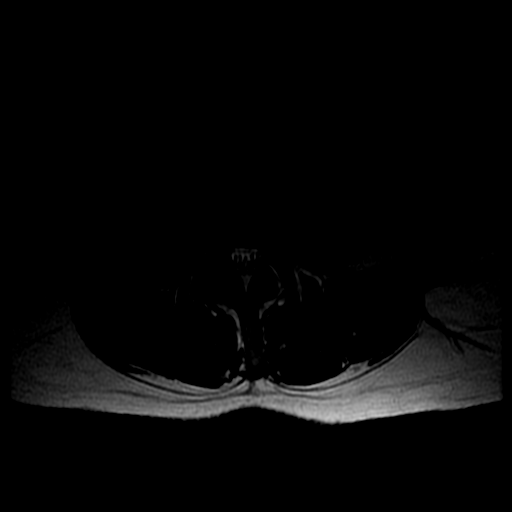
[im 20/36]
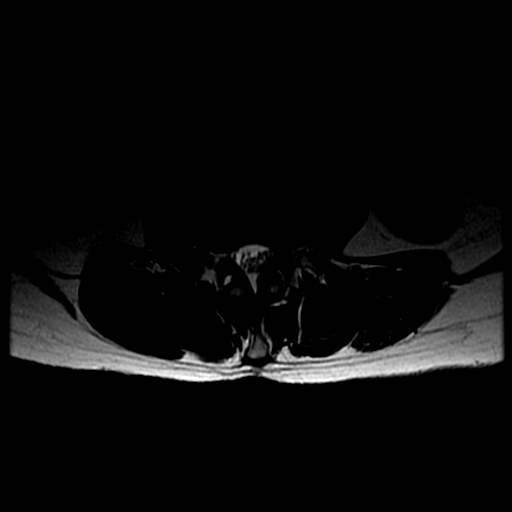
[im 24/36]
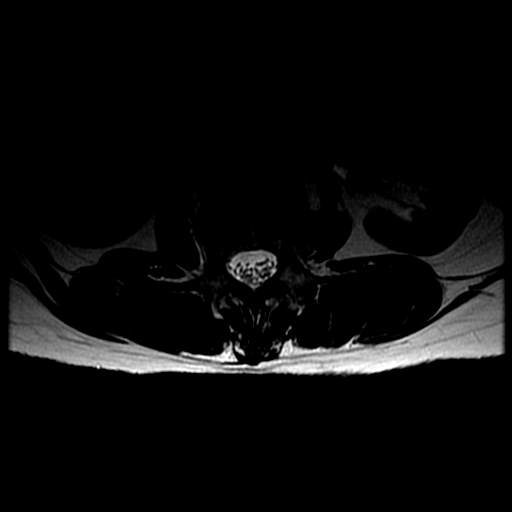
[im 32/36]
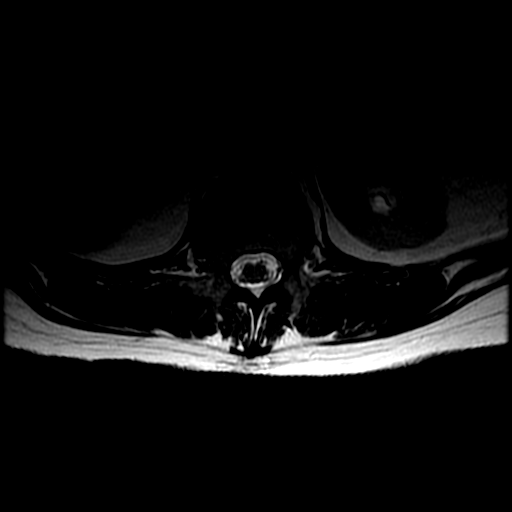

[Series 11: T2 · axial · 5.0mm · 0.39mm/px · z∈[-147,-64]mm · 3 of 18 slices shown (2 of 2)]
[im 1/18]
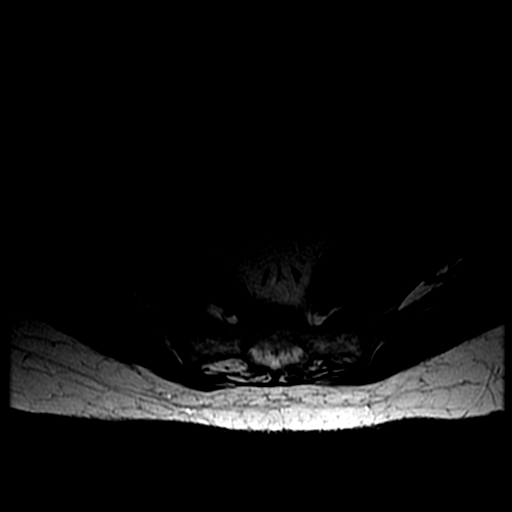
[im 9/18]
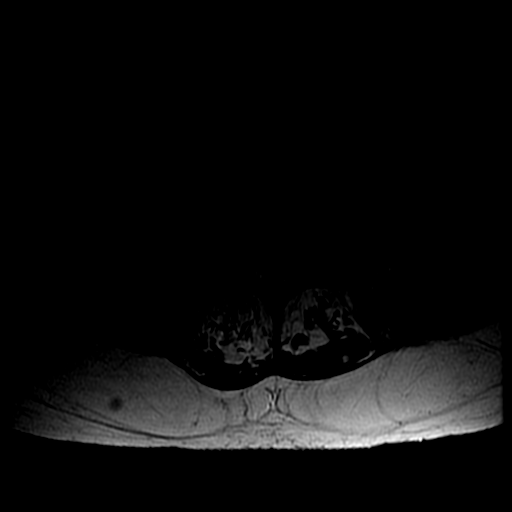
[im 18/18]
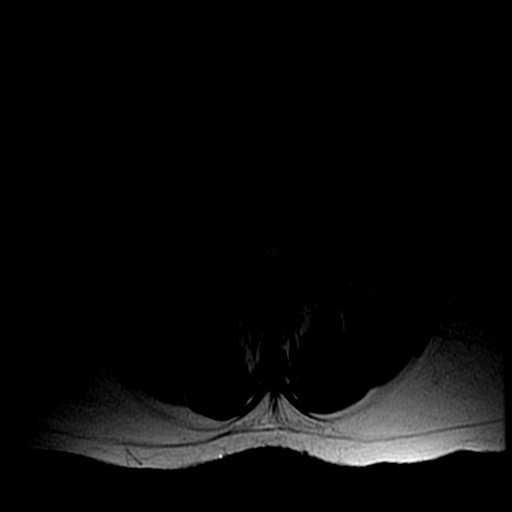

[18 of 48 positions shown; findings below may reference images not displayed]

FINDINGS: Segmentation:  Standard.

Alignment:  No static listhesis.

Vertebrae: No fracture, evidence of discitis, or bone lesion.
Chronic mild wedging of the L1 vertebral body, unchanged.

Conus medullaris and cauda equina: Conus extends to the L1-L2 level.
Conus and cauda equina appear normal.

Paraspinal and other soft tissues: Prominent bilateral extrarenal
pelvises. No acute findings.

Disc levels:

T12-L1: No significant disc protrusion, foraminal stenosis, or canal
stenosis.

L1-L2: Mild diffuse disc bulge, slightly progressed from prior. No
foraminal or canal stenosis.

L2-L3: Mild diffuse disc bulge. No foraminal or canal stenosis.
Unchanged.

L3-L4: Mild diffuse disc bulge results in mild bilateral
subarticular recess narrowing without evidence of neural
compression. Mild canal stenosis. Mild bilateral foraminal stenosis.
Minimal interval progression from prior.

L4-L5: Diffuse disc bulge, eccentric to the right with mild
bilateral facet arthrosis and ligamentum flavum buckling. Findings
result in mild canal stenosis with mild right greater than left
subarticular recess stenosis. Mild bilateral foraminal stenosis.
Findings slightly progressed from prior.

L5-S1: Bilateral facet arthrosis. There is a facet joint effusion
emanating from the posterior aspect of the right L5-S1 facet (series
7, image 5). No significant disc protrusion, foraminal stenosis, or
canal stenosis.

Visualized sacrum and bilateral SI joints appear intact without
acute findings.
IMPRESSION: 1. Mild degenerative changes of the lumbar spine, slightly
progressed at L3-L4 and L4-L5.
2. Mild canal and bilateral foraminal stenosis at L3-L4 and L4-5. No
high-grade canal or foraminal stenosis at any lumbar level.
3. Joint effusion emanating from the posterior aspect of the right
L5-S1 facet joint, which may be a focal source of pain.

## 2021-10-30 ENCOUNTER — Ambulatory Visit (INDEPENDENT_AMBULATORY_CARE_PROVIDER_SITE_OTHER): Payer: Medicare HMO

## 2021-10-30 DIAGNOSIS — J309 Allergic rhinitis, unspecified: Secondary | ICD-10-CM | POA: Diagnosis not present

## 2021-11-19 ENCOUNTER — Ambulatory Visit: Payer: Medicare HMO

## 2021-11-19 ENCOUNTER — Encounter: Payer: Self-pay | Admitting: Gastroenterology

## 2021-11-19 ENCOUNTER — Other Ambulatory Visit: Payer: Medicare Other

## 2021-11-19 ENCOUNTER — Ambulatory Visit (INDEPENDENT_AMBULATORY_CARE_PROVIDER_SITE_OTHER): Payer: Medicare Other | Admitting: Gastroenterology

## 2021-11-19 VITALS — BP 134/80 | HR 80 | Ht 62.0 in | Wt 145.2 lb

## 2021-11-19 DIAGNOSIS — R197 Diarrhea, unspecified: Secondary | ICD-10-CM | POA: Diagnosis not present

## 2021-11-19 DIAGNOSIS — K529 Noninfective gastroenteritis and colitis, unspecified: Secondary | ICD-10-CM

## 2021-11-19 MED ORDER — MESALAMINE 1.2 G PO TBEC: 4.8000 g | DELAYED_RELEASE_TABLET | Freq: Every day | ORAL | 2 refills | Status: DC
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
## 2021-11-19 NOTE — Patient Instructions (Addendum)
It was a pleasure to see you today.  Please stop in the lab today for a stool calprotectin test.   I am hopeful that your insurance will cover [facility]. We will try to get the prescription switched from Pentasa to Lialda today.  Medicines like Celebrex can trigger and worsen colitis. I would like to your doctor about alternatives.   I recommend that you use sunscreen and sun-protective clothing whenever you are outside.  Please see your gynecologist at least every year.   Keeping your bones healthy is important. I recommend that you take daily calcium and Vitamin D supplements. Weight bearing exercise is also good for your bones.   I recommend that you go to the [facility] website for additional information about achieving good health.    I'd like to see you back in the office in about 3 months. Please let me know if you need anything before then.

## 2021-11-27 ENCOUNTER — Ambulatory Visit (INDEPENDENT_AMBULATORY_CARE_PROVIDER_SITE_OTHER): Payer: Medicare Other

## 2021-11-27 DIAGNOSIS — J309 Allergic rhinitis, unspecified: Secondary | ICD-10-CM

## 2021-12-03 NOTE — Progress Notes (Signed)
VIALS EXP 12-03-22

## 2021-12-04 DIAGNOSIS — J3089 Other allergic rhinitis: Secondary | ICD-10-CM | POA: Diagnosis not present

## 2021-12-05 DIAGNOSIS — J301 Allergic rhinitis due to pollen: Secondary | ICD-10-CM | POA: Diagnosis not present

## 2021-12-09 ENCOUNTER — Ambulatory Visit (INDEPENDENT_AMBULATORY_CARE_PROVIDER_SITE_OTHER): Payer: Medicare Other | Admitting: Orthopaedic Surgery

## 2021-12-09 ENCOUNTER — Ambulatory Visit: Payer: Self-pay

## 2021-12-09 ENCOUNTER — Other Ambulatory Visit: Payer: Self-pay

## 2021-12-09 ENCOUNTER — Encounter: Payer: Self-pay | Admitting: Orthopaedic Surgery

## 2021-12-09 DIAGNOSIS — M542 Cervicalgia: Secondary | ICD-10-CM

## 2021-12-09 DIAGNOSIS — M1611 Unilateral primary osteoarthritis, right hip: Secondary | ICD-10-CM | POA: Diagnosis not present

## 2021-12-09 DIAGNOSIS — M79604 Pain in right leg: Secondary | ICD-10-CM | POA: Diagnosis not present

## 2021-12-09 MED ORDER — PREDNISONE 50 MG PO TABS: ORAL_TABLET | ORAL | 0 refills | Status: DC

## 2021-12-09 MED ORDER — GABAPENTIN 300 MG PO CAPS: 300.0000 mg | ORAL_CAPSULE | Freq: Every day | ORAL | 0 refills | Status: DC

## 2021-12-09 NOTE — Progress Notes (Signed)
The patient is well-known to me.  She has well-documented end-stage arthritis of her right hip that we have seen her for for several years.  She is actually scheduled for hip replacement last year and ended up canceling that surgery.  She just states that she was not ready even though her x-rays show bone-on-bone wear and this is now affecting her posture and her lumbar spine.  Her pain is chronic and is daily and is also detrimental affecting her mobility, quality of life and actives daily living.  She feels now ready for hip replacement surgery.  She cannot get good rest at night.  She has not had this numbness and tingling going down her right leg.  She has a previous MRI of her lumbar spine showing some stenosis.  She had previous cervical spine surgeries and having significant neck pain.  She had a cervical fusion between C5 and C7 by Dr. Louanne Skye remotely.  She has seen him for her lumbar spine as well.  She is also dealt with inflammatory bowel disease and chronic kidney stones.  Her left hip moves smoothly normally.  Her right hip has almost no internal X rotation with severe pain with any attempted rotation this is confirmed with previous films of her pelvis and right hip showing severe end-stage arthritis.  Imaging of the cervical and lumbar spine show no acute findings but there is a previous fusion that is not instrumented between C5 and C7.  At this point I am glad to again set her up for a right hip replacement.  I will send in some Neurontin and 5 days of a steroid to see if this will help with her acute pain.  This can also help with her getting rest at night.  We will work on getting her hip replacement scheduled.  She will obtain follow-up with Dr. Louanne Skye for her cervical and lumbar spine.

## 2021-12-16 ENCOUNTER — Encounter: Payer: Self-pay | Admitting: Specialist

## 2021-12-16 ENCOUNTER — Other Ambulatory Visit: Payer: Self-pay

## 2021-12-16 ENCOUNTER — Ambulatory Visit (INDEPENDENT_AMBULATORY_CARE_PROVIDER_SITE_OTHER): Payer: Medicare Other | Admitting: Specialist

## 2021-12-16 VITALS — BP 133/65 | HR 106 | Ht 62.0 in | Wt 145.0 lb

## 2021-12-16 DIAGNOSIS — Z981 Arthrodesis status: Secondary | ICD-10-CM

## 2021-12-16 DIAGNOSIS — M47812 Spondylosis without myelopathy or radiculopathy, cervical region: Secondary | ICD-10-CM

## 2021-12-16 DIAGNOSIS — M79604 Pain in right leg: Secondary | ICD-10-CM

## 2021-12-16 DIAGNOSIS — M5136 Other intervertebral disc degeneration, lumbar region: Secondary | ICD-10-CM | POA: Diagnosis not present

## 2021-12-16 DIAGNOSIS — M47816 Spondylosis without myelopathy or radiculopathy, lumbar region: Secondary | ICD-10-CM

## 2021-12-16 DIAGNOSIS — M4005 Postural kyphosis, thoracolumbar region: Secondary | ICD-10-CM

## 2021-12-16 DIAGNOSIS — M1611 Unilateral primary osteoarthritis, right hip: Secondary | ICD-10-CM

## 2021-12-16 DIAGNOSIS — M542 Cervicalgia: Secondary | ICD-10-CM

## 2021-12-16 MED ORDER — PREGABALIN 50 MG PO CAPS: 50.0000 mg | ORAL_CAPSULE | Freq: Three times a day (TID) | ORAL | 0 refills | Status: DC

## 2021-12-16 MED ORDER — NAPROXEN 500 MG PO TABS: 500.0000 mg | ORAL_TABLET | Freq: Two times a day (BID) | ORAL | 3 refills | Status: DC
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
## 2021-12-16 NOTE — Patient Instructions (Addendum)
°  Plan: Avoid overhead lifting and overhead use of the arms. Do not lift greater than 5 lbs. Adjust head rest in vehicle to prevent hyperextension if rear ended. Take extra precautions to avoid falling.adjacent levels may be necessary but these levels do not appear to be related to your current symptoms or signs Avoid frequent bending and stooping  No lifting greater than 10 lbs. May use ice or moist heat for pain. Weight loss is of benefit. Best medication for lumbar disc disease is arthritis medications like naprosyn. Exercise is important to improve your indurance and does allow people to function better inspite of back pain. Stop celebrex with starting of naproxyn Start lyrica for pain 50 mg BID.  PT is ordered.  Lumbar therapy and cervical spine therapy Follow-Up Instructions: No follow-ups on file.

## 2021-12-16 NOTE — Addendum Note (Signed)
Addended by: Basil Dess on: 12/16/2021 05:11 PM   Modules accepted: Orders
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
## 2021-12-18 ENCOUNTER — Ambulatory Visit: Payer: Medicare Other | Attending: Specialist

## 2021-12-18 ENCOUNTER — Other Ambulatory Visit: Payer: Self-pay

## 2021-12-18 DIAGNOSIS — R293 Abnormal posture: Secondary | ICD-10-CM | POA: Diagnosis present

## 2021-12-18 DIAGNOSIS — M542 Cervicalgia: Secondary | ICD-10-CM | POA: Insufficient documentation

## 2021-12-18 DIAGNOSIS — R252 Cramp and spasm: Secondary | ICD-10-CM | POA: Insufficient documentation

## 2021-12-18 DIAGNOSIS — M436 Torticollis: Secondary | ICD-10-CM | POA: Diagnosis present

## 2021-12-18 DIAGNOSIS — M5136 Other intervertebral disc degeneration, lumbar region: Secondary | ICD-10-CM | POA: Insufficient documentation

## 2021-12-19 ENCOUNTER — Telehealth: Payer: Self-pay | Admitting: Specialist

## 2021-12-19 NOTE — Telephone Encounter (Signed)
Pt called and states that the pregabalin (LYRICA) 50 MG capsule   is making her feel drunk and weird. She wants someone to call her.

## 2021-12-19 NOTE — Telephone Encounter (Signed)
Please advise on message below. Thank you!

## 2021-12-20 ENCOUNTER — Telehealth: Payer: Self-pay | Admitting: Specialist

## 2021-12-20 NOTE — Telephone Encounter (Signed)
Called and left a VM advising patient of message below per Dr. Louanne Skye.  Advised patient to call the office.

## 2021-12-20 NOTE — Telephone Encounter (Signed)
Patient was retuning your call.  [phone] is her only phone number to be reached on.  Please advise.

## 2021-12-20 NOTE — Telephone Encounter (Signed)
Pt returned call back [phone]. Only phone number she has. Pt started yelling bout she has minutes and she don't always carry her phone with her. I explained to her if you are waiting from a call back from your doctor to listen out for your phone and ended call.

## 2021-12-24 ENCOUNTER — Other Ambulatory Visit: Payer: Self-pay

## 2021-12-25 ENCOUNTER — Other Ambulatory Visit: Payer: Self-pay

## 2021-12-25 ENCOUNTER — Encounter: Payer: Self-pay | Admitting: Physical Therapy

## 2021-12-25 ENCOUNTER — Ambulatory Visit: Payer: Medicare Other | Admitting: Physical Therapy

## 2021-12-25 DIAGNOSIS — R293 Abnormal posture: Secondary | ICD-10-CM

## 2021-12-25 DIAGNOSIS — M542 Cervicalgia: Secondary | ICD-10-CM | POA: Diagnosis not present

## 2021-12-25 DIAGNOSIS — R252 Cramp and spasm: Secondary | ICD-10-CM

## 2021-12-25 DIAGNOSIS — M436 Torticollis: Secondary | ICD-10-CM
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
## 2021-12-26 ENCOUNTER — Ambulatory Visit (INDEPENDENT_AMBULATORY_CARE_PROVIDER_SITE_OTHER): Payer: Medicare Other | Admitting: Allergy & Immunology

## 2021-12-26 ENCOUNTER — Encounter: Payer: Self-pay | Admitting: Allergy & Immunology

## 2021-12-26 ENCOUNTER — Ambulatory Visit: Payer: Self-pay | Admitting: *Deleted

## 2021-12-26 VITALS — BP 132/72 | HR 91 | Temp 98.0°F | Resp 20 | Ht 64.0 in | Wt 156.8 lb

## 2021-12-26 DIAGNOSIS — J3089 Other allergic rhinitis: Secondary | ICD-10-CM

## 2021-12-26 DIAGNOSIS — R011 Cardiac murmur, unspecified: Secondary | ICD-10-CM

## 2021-12-26 DIAGNOSIS — J309 Allergic rhinitis, unspecified: Secondary | ICD-10-CM

## 2021-12-26 DIAGNOSIS — J4540 Moderate persistent asthma, uncomplicated: Secondary | ICD-10-CM

## 2021-12-26 DIAGNOSIS — K219 Gastro-esophageal reflux disease without esophagitis: Secondary | ICD-10-CM

## 2021-12-26 NOTE — Patient Instructions (Addendum)
1. Moderate persistent asthma, uncomplicated - Spirometry (lung testing) looks awesome. - We are not going to make any changes at this time.  - Daily controller medication(s): Singulair (montelukast) 10mg  daily - Prior to physical activity: albuterol 2 puffs 10-15 minutes before physical activity. - Rescue medications: albuterol 4 puffs every 4-6 hours as needed - Changes during respiratory infections or worsening symptoms: Add on Symbicort to 2 puffs twice daily for TWO WEEKS. - Asthma control goals:  * Full participation in all desired activities (may need albuterol before activity) * Albuterol use two time or less a week on average (not counting use with activity) * Cough interfering with sleep two time or less a month * Oral steroids no more than once a year * No hospitalizations  2. Perennial and seasonal allergic rhinitis - Continue with allergy shots at the same schedule. - Continue with fluticasone nasal spray daily to help control runny nose as needed.  - Continue with azelastine nasal spray one spray per nostril up to twice daily as needed. - Continue with your cetirizine and the montelukast.   3. Return in about 6 months (around 06/25/2022).    Please inform us of any Emergency Department visits, hospitalizations, or changes in symptoms. Call us before going to the ED for breathing or allergy symptoms since we might be able to fit you in for a sick visit. Feel free to contact us anytime with any questions, problems, or concerns.  It was a pleasure to see you again today!  Websites that have reliable patient information: 1. American Academy of Asthma, Allergy, and Immunology: www.aaaai.org 2. Food Allergy Research and Education (FARE): foodallergy.org 3. Mothers of Asthmatics: http://www.asthmacommunitynetwork.org 4. American College of Allergy, Asthma, and Immunology: www.acaai.org   COVID-19 Vaccine Information can be found at:  ShippingScam.co.uk For questions related to vaccine distribution or appointments, please email vaccine@ .com or call [phone].   We realize that you might be concerned about having an allergic reaction to the COVID19 vaccines. To help with that concern, WE ARE OFFERING THE COVID19 VACCINES IN OUR OFFICE! Ask the front desk for dates!     Like [place] on [place] and Instagram for our latest updates!      A healthy democracy works best when [employer] participate! Make sure you are registered to vote! If you have moved or changed any of your contact information, you will need to get this updated before voting!  In some cases, you MAY be able to register to vote online: [email or web address]

## 2021-12-26 NOTE — Progress Notes (Signed)
FOLLOW UP  Date of Service/Encounter:  12/26/21   Assessment:   Moderate persistent asthma, uncomplicated   Perennial and seasonal allergic rhinitis (grasses, ragweed, weeds, molds, cat, dog, dust mite) - on allergen immunotherapy (started June 2019 and reached maintenance in October 2019)  Heart murmur - Cardiology referral in place   Chronic pain   Disabled status   Sheila Vincent is actually doing quite well with her allergen immunotherapy.  It has helped her to be able to control her asthma much more effectively.  In fact, she is no longer on a controller medication and having no exacerbations.  Allergy shots are well-tolerated and she will reach 5 years of allergy shots in October 2024.  We are going to continue for at least that much time to see how she does.  She does ask a lot of questions about a heart murmur, including its size and clinical implications.  She already has a cardiology appointment in place, but I did recommend that if she develops worsening chest pain or passes out or has any other concerns, she should go to the ER immediately.  She is going to call the cardiology clinic to see if she can schedule that appointment since the referral is already in place.  Plan/Recommendations:    1. Moderate persistent asthma, uncomplicated - Spirometry (lung testing) looks awesome. - We are not going to make any changes at this time.  - Daily controller medication(s): Singulair (montelukast) 10mg  daily - Prior to physical activity: albuterol 2 puffs 10-15 minutes before physical activity. - Rescue medications: albuterol 4 puffs every 4-6 hours as needed - Changes during respiratory infections or worsening symptoms: Add on Symbicort to 2 puffs twice daily for TWO WEEKS. - Asthma control goals:  * Full participation in all desired activities (may need albuterol before activity) * Albuterol use two time or less a week on average (not counting use with activity) * Cough interfering  with sleep two time or less a month * Oral steroids no more than once a year * No hospitalizations  2. Perennial and seasonal allergic rhinitis - Continue with allergy shots at the same schedule. - Continue with fluticasone nasal spray daily to help control runny nose as needed.  - Continue with azelastine nasal spray one spray per nostril up to twice daily as needed. - Continue with your cetirizine and the montelukast.   3. Return in about 6 months (around 06/25/2022).    Subjective:   Sheila Vincent is a 61 y.o. female presenting today for follow up of  Chief Complaint  Patient presents with   Other    Want to be checked to see if she has a heart murmur. Having pains in that area on her right side.   Asthma    Doing real good.   Allergic Rhinitis     Been taking the shots and it has been helping tremendously.      Sheila Vincent has a history of the following: Patient Active Problem List   Diagnosis Date Noted   IBD (inflammatory bowel disease) 08/08/2021   Symptomatic anemia 08/08/2021   Iron deficiency anemia due to chronic blood loss 08/08/2021   Unilateral primary osteoarthritis, right hip 01/18/2020   Eustachian tube dysfunction, right 09/05/2019   Bug bite 04/21/2019   Murmur 04/21/2019   GERD (gastroesophageal reflux disease) 04/06/2018   Cough, persistent 04/06/2018   Hypertension 03/30/2018   Perennial and seasonal allergic rhinitis 07/14/2015   Moderate persistent asthma 07/14/2015   Upper airway  cough syndrome 12/06/2014   Right foot pain 07/06/2014   Low back pain 07/06/2014    History obtained from: chart review and patient.  Sheila Vincent is a 61 y.o. female presenting for a follow up visit.  She was last seen in July 2022 by Webb Silversmith one of our nurse practitioners.  At that time, she was continued on montelukast as well as albuterol.  She was also continued on Symbicort 160 mcg 2 puffs twice daily as needed.  She had previously been on it routinely, but was  not needing it.  For her allergic rhinitis, she was continued on cetirizine, Flonase, and Astelin.  She did have a slightly elevated blood pressure at that visit of 142/82.  Since the last visit, she has mostly done well. However she is reporting that she has bene having some chest pain. This has been going on for several weeks. She feels that this is a "heart beat" pain and it lasts for a few minutes and then stays away for a while and comes back again. She has not had it for a few weeks. It looks like she has been referred to Cardiology just today by her PCP.   Asthma/Respiratory Symptom History: She thinks that her asthma is under good control.  She has not been using her Symbicort at all.  She does have albuterol on hand to use as needed, but uses it less than once per week.  She remains on the montelukast 10 mg daily.  Allergic Rhinitis Symptom History: She remains on the Flonase and the azelastine on a as needed basis. She is on her cetirizine which she takes 1 shot days and most other days for that matter.  She has not needed any antibiotics for sinus infections.  Sheila Vincent is on allergen immunotherapy. She receives two injections. Immunotherapy script #1 contains weeds, grasses, molds, dust mites, cat and dog. She currently receives 0.54mL of the RED vial (1/100). Immunotherapy script #2 contains molds. She currently receives 0.43mL of the RED vial (1/100). She started shots June of 2019 and reached maintenance in October 20219.   She is very concerned today about a heart murmur.  She tells me a very long story about this.  Review of her chart shows that she has had a heart murmur noted multiple times by her PCP.  She does have a referral to cardiology in place already.  This was just placed today.  She does have some chest pain, but it seems rather vague.  She has not gone to the hospital for evaluation of this.  She has a history of ulcerative colitis with chronic diarrhea. She was diagnosed a  few years ago and currently she is on Crohn's disease. This has been going on since 2021.  She is followed by Dr. Thornton Park at [facility].she last saw her in January 2023.  At that time, she was started on Lialda.    Bringing up the GI issues makes her remember her cat.  She tells me a long story about her cat who died in the last few years.  He was apparently 63 years old and had 3 legs.  She had him cremated and keeps his ashes in a wooden urn, which she kisses every night.  She also tells me that she opens the bottom of the urine by sliding the bottom out and rubs the bag every night.  Apparently the ashes are in a blue satin bag, but they are more bones and ashes.  Otherwise, there  have been no changes to her past medical history, surgical history, family history, or social history.    Review of Systems  Constitutional: Negative.  Negative for chills, fever, malaise/fatigue and weight loss.  HENT:  Positive for congestion. Negative for ear discharge, ear pain and sinus pain.   Eyes:  Negative for pain, discharge and redness.  Respiratory:  Negative for cough, sputum production, shortness of breath and wheezing.   Cardiovascular:  Positive for chest pain. Negative for palpitations.  Gastrointestinal:  Negative for abdominal pain, constipation, diarrhea, heartburn, nausea and vomiting.  Skin: Negative.  Negative for itching and rash.  Neurological:  Negative for dizziness and headaches.  Endo/Heme/Allergies:  Negative for environmental allergies. Does not bruise/bleed easily.      Objective:   Blood pressure 132/72, pulse 91, temperature 98 F (36.7 C), temperature source Temporal, resp. rate 20, height 5\' 4"  (1.626 m), weight 156 lb 12.8 oz (71.1 kg), SpO2 99 %. Body mass index is 26.91 kg/m.    Physical Exam Constitutional:      Appearance: She is well-developed.     Comments: Pleasant and interactive.   HENT:     Head: Normocephalic and atraumatic.      Right Ear: Tympanic membrane, ear canal and external ear normal.     Left Ear: Tympanic membrane, ear canal and external ear normal.     Nose: No nasal deformity, septal deviation, mucosal edema or rhinorrhea.     Right Turbinates: Enlarged, swollen and pale.     Left Turbinates: Enlarged, swollen and pale.     Right Sinus: No maxillary sinus tenderness or frontal sinus tenderness.     Left Sinus: No maxillary sinus tenderness or frontal sinus tenderness.     Mouth/Throat:     Mouth: Mucous membranes are not pale and not dry.     Pharynx: Uvula midline.     Comments: Mild to moderate cobblestoning. Eyes:     General:        Right eye: No discharge.        Left eye: No discharge.     Conjunctiva/sclera: Conjunctivae normal.     Right eye: Right conjunctiva is not injected. No chemosis.    Left eye: Left conjunctiva is not injected. No chemosis.    Pupils: Pupils are equal, round, and reactive to light.  Cardiovascular:     Rate and Rhythm: Normal rate and regular rhythm.     Heart sounds: Murmur heard.  Crescendo decrescendo systolic murmur is present with a grade of 3/6.  Pulmonary:     Effort: Pulmonary effort is normal. No tachypnea, accessory muscle usage or respiratory distress.     Breath sounds: Normal breath sounds. No wheezing, rhonchi or rales.     Comments: Moving air well in all lung fields. Chest:     Chest wall: No tenderness.  Lymphadenopathy:     Cervical: No cervical adenopathy.  Skin:    Coloration: Skin is not pale.     Findings: No abrasion, erythema, petechiae or rash. Rash is not papular, urticarial or vesicular.     Comments: No eczematous or urticarial lesions noted.  Neurological:     Mental Status: She is alert.  Psychiatric:        Behavior: Behavior is cooperative.     Diagnostic studies: none       Salvatore Marvel, MD  [facility] [place] [facility] [place]

## 2021-12-27 ENCOUNTER — Ambulatory Visit: Payer: Medicare Other | Admitting: Physical Therapy

## 2021-12-27 ENCOUNTER — Encounter: Payer: Medicare Other | Admitting: Physical Therapy

## 2021-12-27 ENCOUNTER — Other Ambulatory Visit: Payer: Self-pay

## 2021-12-27 ENCOUNTER — Encounter: Payer: Self-pay | Admitting: Physical Therapy

## 2021-12-27 DIAGNOSIS — M542 Cervicalgia: Secondary | ICD-10-CM

## 2021-12-27 DIAGNOSIS — R293 Abnormal posture: Secondary | ICD-10-CM

## 2021-12-27 DIAGNOSIS — R252 Cramp and spasm: Secondary | ICD-10-CM

## 2021-12-27 DIAGNOSIS — M436 Torticollis: Secondary | ICD-10-CM

## 2021-12-27 MED ORDER — ALBUTEROL SULFATE HFA 108 (90 BASE) MCG/ACT IN AERS
2.0000 | INHALATION_SPRAY | RESPIRATORY_TRACT | 1 refills | Status: DC | PRN
Start: 2021-12-27 — End: 2021-12-30

## 2021-12-27 MED ORDER — MONTELUKAST SODIUM 10 MG PO TABS: 10.0000 mg | ORAL_TABLET | Freq: Every day | ORAL | 1 refills | Status: DC
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
## 2021-12-30 ENCOUNTER — Other Ambulatory Visit: Payer: Self-pay

## 2021-12-30 ENCOUNTER — Ambulatory Visit: Payer: Medicare Other | Admitting: Cardiology

## 2021-12-30 ENCOUNTER — Encounter: Payer: Self-pay | Admitting: Cardiology

## 2021-12-30 VITALS — BP 130/75 | HR 81 | Temp 98.0°F | Resp 16 | Ht 64.0 in | Wt 155.0 lb

## 2021-12-30 DIAGNOSIS — Z8616 Personal history of COVID-19: Secondary | ICD-10-CM

## 2021-12-30 DIAGNOSIS — I10 Essential (primary) hypertension: Secondary | ICD-10-CM

## 2021-12-30 DIAGNOSIS — R0989 Other specified symptoms and signs involving the circulatory and respiratory systems: Secondary | ICD-10-CM

## 2021-12-30 DIAGNOSIS — R072 Precordial pain: Secondary | ICD-10-CM

## 2021-12-30 DIAGNOSIS — R011 Cardiac murmur, unspecified: Secondary | ICD-10-CM

## 2021-12-30 MED ORDER — ASPIRIN EC 81 MG PO TBEC: 81.0000 mg | DELAYED_RELEASE_TABLET | Freq: Every day | ORAL | 11 refills | Status: DC
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
## 2021-12-31 ENCOUNTER — Ambulatory Visit: Payer: Medicare Other

## 2021-12-31 DIAGNOSIS — R293 Abnormal posture: Secondary | ICD-10-CM

## 2021-12-31 DIAGNOSIS — R252 Cramp and spasm: Secondary | ICD-10-CM

## 2021-12-31 DIAGNOSIS — M542 Cervicalgia: Secondary | ICD-10-CM

## 2021-12-31 DIAGNOSIS — M436 Torticollis: Secondary | ICD-10-CM
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
## 2022-01-03 ENCOUNTER — Ambulatory Visit: Payer: Medicare Other

## 2022-01-04 ENCOUNTER — Other Ambulatory Visit: Payer: Self-pay | Admitting: Family Medicine

## 2022-01-04 ENCOUNTER — Other Ambulatory Visit: Payer: Self-pay | Admitting: Orthopaedic Surgery

## 2022-01-07 ENCOUNTER — Ambulatory Visit: Payer: Medicare Other | Attending: Specialist

## 2022-01-07 ENCOUNTER — Other Ambulatory Visit: Payer: Self-pay

## 2022-01-07 DIAGNOSIS — R252 Cramp and spasm: Secondary | ICD-10-CM | POA: Diagnosis present

## 2022-01-07 DIAGNOSIS — M436 Torticollis: Secondary | ICD-10-CM | POA: Insufficient documentation

## 2022-01-07 DIAGNOSIS — M542 Cervicalgia: Secondary | ICD-10-CM | POA: Insufficient documentation

## 2022-01-07 DIAGNOSIS — R293 Abnormal posture: Secondary | ICD-10-CM | POA: Insufficient documentation
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
## 2022-01-08 ENCOUNTER — Ambulatory Visit: Payer: Medicare Other | Admitting: Orthopaedic Surgery
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
## 2022-01-09 ENCOUNTER — Ambulatory Visit: Payer: Medicare Other

## 2022-01-09 ENCOUNTER — Other Ambulatory Visit: Payer: Self-pay

## 2022-01-09 ENCOUNTER — Ambulatory Visit: Payer: Medicare Other | Admitting: Orthopaedic Surgery

## 2022-01-09 ENCOUNTER — Other Ambulatory Visit: Payer: Medicare Other

## 2022-01-09 ENCOUNTER — Other Ambulatory Visit: Payer: Self-pay | Admitting: Allergy & Immunology

## 2022-01-09 DIAGNOSIS — M542 Cervicalgia: Secondary | ICD-10-CM

## 2022-01-09 DIAGNOSIS — R252 Cramp and spasm: Secondary | ICD-10-CM

## 2022-01-09 DIAGNOSIS — M436 Torticollis: Secondary | ICD-10-CM

## 2022-01-09 DIAGNOSIS — R293 Abnormal posture: Secondary | ICD-10-CM

## 2022-01-10 ENCOUNTER — Ambulatory Visit: Payer: Medicare Other

## 2022-01-10 DIAGNOSIS — R072 Precordial pain: Secondary | ICD-10-CM

## 2022-01-10 DIAGNOSIS — R011 Cardiac murmur, unspecified: Secondary | ICD-10-CM

## 2022-01-10 DIAGNOSIS — R0989 Other specified symptoms and signs involving the circulatory and respiratory systems: Secondary | ICD-10-CM

## 2022-01-14 ENCOUNTER — Ambulatory Visit: Payer: Medicare Other

## 2022-01-14 ENCOUNTER — Other Ambulatory Visit: Payer: Self-pay

## 2022-01-14 DIAGNOSIS — M436 Torticollis: Secondary | ICD-10-CM

## 2022-01-14 DIAGNOSIS — R293 Abnormal posture: Secondary | ICD-10-CM

## 2022-01-14 DIAGNOSIS — M542 Cervicalgia: Secondary | ICD-10-CM

## 2022-01-14 DIAGNOSIS — R252 Cramp and spasm: Secondary | ICD-10-CM
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
## 2022-01-16 ENCOUNTER — Ambulatory Visit: Payer: Medicare Other

## 2022-01-16 ENCOUNTER — Other Ambulatory Visit: Payer: Self-pay

## 2022-01-16 ENCOUNTER — Other Ambulatory Visit: Payer: Self-pay | Admitting: Cardiology

## 2022-01-16 DIAGNOSIS — M542 Cervicalgia: Secondary | ICD-10-CM | POA: Diagnosis not present

## 2022-01-16 LAB — LIPID PANEL WITH LDL/HDL RATIO
Cholesterol, Total: 247 mg/dL — ABNORMAL HIGH (ref 100–199)
HDL: 38 mg/dL — ABNORMAL LOW (ref >39)
LDL Chol Calc (NIH): 133 mg/dL — ABNORMAL HIGH (ref 0–99)
LDL/HDL Ratio: 3.5 ratio — ABNORMAL HIGH (ref 0.0–3.2)
Triglycerides: 417 mg/dL — ABNORMAL HIGH (ref 0–149)
VLDL Cholesterol Cal: 76 mg/dL — ABNORMAL HIGH (ref 5–40)

## 2022-01-16 LAB — CMP14+EGFR
ALT: 19 IU/L (ref 0–32)
AST: 20 IU/L (ref 0–40)
Albumin/Globulin Ratio: 1.7 (ref 1.2–2.2)
Albumin: 4.4 g/dL (ref 3.8–4.8)
Alkaline Phosphatase: 145 IU/L — ABNORMAL HIGH (ref 44–121)
BUN/Creatinine Ratio: 17 (ref 12–28)
BUN: 12 mg/dL (ref 8–27)
Bilirubin Total: 0.2 mg/dL (ref 0.0–1.2)
CO2: 25 mmol/L (ref 20–29)
Calcium: 9.9 mg/dL (ref 8.7–10.3)
Chloride: 98 mmol/L (ref 96–106)
Creatinine, Ser: 0.72 mg/dL (ref 0.57–1.00)
Globulin, Total: 2.6 g/dL (ref 1.5–4.5)
Glucose: 118 mg/dL — ABNORMAL HIGH (ref 70–99)
Potassium: 4.8 mmol/L (ref 3.5–5.2)
Sodium: 138 mmol/L (ref 134–144)
Total Protein: 7 g/dL (ref 6.0–8.5)
eGFR: 94 mL/min/{1.73_m2} (ref >59)

## 2022-01-16 LAB — LDL CHOLESTEROL, DIRECT: LDL Direct: 140 mg/dL — ABNORMAL HIGH (ref 0–99)

## 2022-01-16 MED ORDER — ATORVASTATIN CALCIUM 40 MG PO TABS: 40.0000 mg | ORAL_TABLET | Freq: Every day | ORAL | 0 refills | Status: DC

## 2022-01-16 MED ORDER — EZETIMIBE 10 MG PO TABS: 10.0000 mg | ORAL_TABLET | Freq: Every day | ORAL | 0 refills | Status: DC

## 2022-01-16 NOTE — Progress Notes (Signed)
Patient is aware of results.

## 2022-01-16 NOTE — Progress Notes (Signed)
Called patient, NA, LMAM. ? ?- Patient has another message in the queue. -

## 2022-01-16 NOTE — Progress Notes (Signed)
Called patient, NA, LMAM. ? ?- Patient has another message in queue. -

## 2022-01-17 ENCOUNTER — Ambulatory Visit (INDEPENDENT_AMBULATORY_CARE_PROVIDER_SITE_OTHER): Payer: Medicare Other | Admitting: Specialist

## 2022-01-17 ENCOUNTER — Encounter: Payer: Self-pay | Admitting: Specialist

## 2022-01-17 VITALS — BP 129/76 | HR 80 | Ht 62.0 in | Wt 145.0 lb

## 2022-01-17 DIAGNOSIS — M25551 Pain in right hip: Secondary | ICD-10-CM

## 2022-01-17 DIAGNOSIS — Z981 Arthrodesis status: Secondary | ICD-10-CM

## 2022-01-17 DIAGNOSIS — M5136 Other intervertebral disc degeneration, lumbar region: Secondary | ICD-10-CM | POA: Diagnosis not present

## 2022-01-17 DIAGNOSIS — M47816 Spondylosis without myelopathy or radiculopathy, lumbar region: Secondary | ICD-10-CM

## 2022-01-17 DIAGNOSIS — M4005 Postural kyphosis, thoracolumbar region: Secondary | ICD-10-CM | POA: Diagnosis not present

## 2022-01-17 DIAGNOSIS — M542 Cervicalgia: Secondary | ICD-10-CM

## 2022-01-17 DIAGNOSIS — M47812 Spondylosis without myelopathy or radiculopathy, cervical region: Secondary | ICD-10-CM

## 2022-01-17 DIAGNOSIS — M1611 Unilateral primary osteoarthritis, right hip: Secondary | ICD-10-CM

## 2022-01-17 MED ORDER — CODEINE SULFATE 30 MG PO TABS: 30.0000 mg | ORAL_TABLET | Freq: Four times a day (QID) | ORAL | 0 refills | Status: DC | PRN
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
## 2022-01-17 NOTE — Addendum Note (Signed)
Addended by: Basil Dess on: 01/17/2022 02:32 PM ? ? Modules accepted: Orders ? ?
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
## 2022-01-20 ENCOUNTER — Other Ambulatory Visit: Payer: Medicare HMO

## 2022-01-20 ENCOUNTER — Other Ambulatory Visit: Payer: Self-pay

## 2022-01-20 ENCOUNTER — Ambulatory Visit: Payer: Medicare Other

## 2022-01-20 DIAGNOSIS — R072 Precordial pain: Secondary | ICD-10-CM

## 2022-01-21 ENCOUNTER — Ambulatory Visit: Payer: Medicare Other

## 2022-01-23 ENCOUNTER — Other Ambulatory Visit: Payer: Self-pay | Admitting: Physician Assistant

## 2022-01-23 ENCOUNTER — Ambulatory Visit (INDEPENDENT_AMBULATORY_CARE_PROVIDER_SITE_OTHER): Payer: Medicare Other

## 2022-01-23 ENCOUNTER — Other Ambulatory Visit: Payer: Self-pay

## 2022-01-23 ENCOUNTER — Ambulatory Visit: Payer: Medicare Other

## 2022-01-23 ENCOUNTER — Encounter (HOSPITAL_COMMUNITY): Payer: Self-pay | Admitting: Emergency Medicine

## 2022-01-23 ENCOUNTER — Ambulatory Visit (HOSPITAL_COMMUNITY)
Admission: EM | Admit: 2022-01-23 | Discharge: 2022-01-23 | Disposition: A | Discharge status: Home / Self Care | Payer: Medicare Other | Attending: Nurse Practitioner | Admitting: Nurse Practitioner

## 2022-01-23 DIAGNOSIS — M1990 Unspecified osteoarthritis, unspecified site: Secondary | ICD-10-CM

## 2022-01-23 DIAGNOSIS — R293 Abnormal posture: Secondary | ICD-10-CM

## 2022-01-23 DIAGNOSIS — J309 Allergic rhinitis, unspecified: Secondary | ICD-10-CM | POA: Diagnosis not present

## 2022-01-23 DIAGNOSIS — M1611 Unilateral primary osteoarthritis, right hip: Secondary | ICD-10-CM

## 2022-01-23 DIAGNOSIS — M542 Cervicalgia: Secondary | ICD-10-CM

## 2022-01-23 DIAGNOSIS — M436 Torticollis: Secondary | ICD-10-CM

## 2022-01-23 DIAGNOSIS — R252 Cramp and spasm: Secondary | ICD-10-CM

## 2022-01-23 MED ORDER — ACETAMINOPHEN 325 MG PO TABS
ORAL_TABLET | ORAL | Status: AC
  Filled 2022-01-23: qty 2

## 2022-01-23 MED ORDER — ACETAMINOPHEN 325 MG PO TABS
650.0000 mg | ORAL_TABLET | Freq: Once | ORAL | Status: AC
Start: 2022-01-23 — End: 2022-01-23
  Administered 2022-01-23: 650 mg via ORAL
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
## 2022-01-23 NOTE — Discharge Instructions (Addendum)
Your symptoms are consistent with arthritic changes, or osteoarthritis. ?I recommend taking over-the-counter Tylenol Arthritis Strength 650 mg.  You can take 1 to 2 tablets every 8 hours as needed for pain. ?You will need to follow-up with your primary care physician for continued management of your symptoms. ?As discussed, the cardiac procedure that you had on 3/20 is not related to any of your symptoms. ?Follow-up as needed. ?
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
## 2022-01-23 NOTE — ED Triage Notes (Signed)
Pt had to have issues with her hip and before she can have surgery in April, had to have testing done to be cleared by cardiology. Pt had pain in left elbow, lower arm and left leg after doing test at cardiology office on Monday.  ?

## 2022-01-29 LAB — CMP14+EGFR
ALT: 14 IU/L (ref 0–32)
AST: 16 IU/L (ref 0–40)
Albumin/Globulin Ratio: 2 (ref 1.2–2.2)
Albumin: 4.4 g/dL (ref 3.8–4.8)
Alkaline Phosphatase: 127 IU/L — ABNORMAL HIGH (ref 44–121)
BUN/Creatinine Ratio: 15 (ref 12–28)
BUN: 10 mg/dL (ref 8–27)
Bilirubin Total: 0.3 mg/dL (ref 0.0–1.2)
CO2: 23 mmol/L (ref 20–29)
Calcium: 9.4 mg/dL (ref 8.7–10.3)
Chloride: 105 mmol/L (ref 96–106)
Creatinine, Ser: 0.68 mg/dL (ref 0.57–1.00)
Globulin, Total: 2.2 g/dL (ref 1.5–4.5)
Glucose: 117 mg/dL — ABNORMAL HIGH (ref 70–99)
Potassium: 5 mmol/L (ref 3.5–5.2)
Sodium: 144 mmol/L (ref 134–144)
Total Protein: 6.6 g/dL (ref 6.0–8.5)
eGFR: 98 mL/min/{1.73_m2} (ref >59)

## 2022-01-29 LAB — LIPID PANEL
Chol/HDL Ratio: 3.9 ratio (ref 0.0–4.4)
Cholesterol, Total: 125 mg/dL (ref 100–199)
HDL: 32 mg/dL — ABNORMAL LOW (ref >39)
LDL Chol Calc (NIH): 61 mg/dL (ref 0–99)
Triglycerides: 195 mg/dL — ABNORMAL HIGH (ref 0–149)
VLDL Cholesterol Cal: 32 mg/dL (ref 5–40)

## 2022-01-29 LAB — LDL CHOLESTEROL, DIRECT: LDL Direct: 59 mg/dL (ref 0–99)

## 2022-01-30 ENCOUNTER — Ambulatory Visit: Payer: Medicare Other

## 2022-01-30 ENCOUNTER — Encounter: Payer: Self-pay | Admitting: Cardiology

## 2022-01-30 ENCOUNTER — Ambulatory Visit: Payer: Medicare Other | Admitting: Cardiology

## 2022-01-30 VITALS — BP 130/83 | HR 77 | Temp 97.7°F | Resp 16 | Ht 62.0 in | Wt 153.4 lb

## 2022-01-30 DIAGNOSIS — E781 Pure hyperglyceridemia: Secondary | ICD-10-CM

## 2022-01-30 DIAGNOSIS — E7800 Pure hypercholesterolemia, unspecified: Secondary | ICD-10-CM

## 2022-01-30 DIAGNOSIS — Z8616 Personal history of COVID-19: Secondary | ICD-10-CM

## 2022-01-30 DIAGNOSIS — I10 Essential (primary) hypertension: Secondary | ICD-10-CM

## 2022-01-30 DIAGNOSIS — R072 Precordial pain: Secondary | ICD-10-CM
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
## 2022-01-31 ENCOUNTER — Telehealth: Payer: Self-pay | Admitting: Orthopaedic Surgery

## 2022-01-31 NOTE — Telephone Encounter (Signed)
Pt is calling to state that her PCP , wants surgery on hold for 1 month -- Chest condition, information was supposed to be sent to [facility]  ? ?Pt is calling to make sure you are aware  ? ?Surgery April 14. ?

## 2022-02-01 ENCOUNTER — Telehealth: Payer: Self-pay | Admitting: Orthopaedic Surgery

## 2022-02-03 ENCOUNTER — Other Ambulatory Visit: Payer: Self-pay

## 2022-02-03 ENCOUNTER — Encounter (HOSPITAL_COMMUNITY)
Admission: RE | Admit: 2022-02-03 | Discharge: 2022-02-03 | Disposition: A | Discharge status: Home / Self Care | Payer: Medicare Other | Source: Ambulatory Visit | Attending: Anesthesiology | Admitting: Anesthesiology

## 2022-02-03 DIAGNOSIS — I10 Essential (primary) hypertension: Secondary | ICD-10-CM

## 2022-02-03 DIAGNOSIS — E781 Pure hyperglyceridemia: Secondary | ICD-10-CM

## 2022-02-03 DIAGNOSIS — Z01818 Encounter for other preprocedural examination: Secondary | ICD-10-CM

## 2022-02-03 DIAGNOSIS — E7800 Pure hypercholesterolemia, unspecified: Secondary | ICD-10-CM

## 2022-02-03 NOTE — Patient Instructions (Signed)
DUE TO COVID-19 ONLY ONE VISITOR  (aged 64 and older)  IS ALLOWED TO COME WITH YOU AND STAY IN THE WAITING ROOM ONLY DURING PRE OP AND PROCEDURE.   ?**NO VISITORS ARE ALLOWED IN THE SHORT STAY AREA OR RECOVERY ROOM!!** ? ?IF YOU WILL BE ADMITTED INTO THE HOSPITAL YOU ARE ALLOWED ONLY TWO SUPPORT PEOPLE DURING VISITATION HOURS ONLY (7 AM -8PM)   ?The support person(s) must pass our screening, gel in and out, and wear a mask at all times, including in the patient?s room. ?Patients must also wear a mask when staff or their support person are in the room. ?Visitors GUEST BADGE MUST BE WORN VISIBLY  ?One adult visitor may remain with you overnight and MUST be in the room by 8 P.M. ?  ? ? Your procedure is scheduled on: 02/14/22 ? ? Report to [facility] Main Entrance ? ?  Report to admitting at 5:15 AM ? ? Call this number if you have problems the morning of surgery [phone] ? ? Do not eat food :After Midnight. ? ? After Midnight you may have the following liquids until 4:15 AM DAY OF SURGERY ? ?Water ?Black Coffee (sugar ok, NO MILK/CREAM OR CREAMERS)  ?Tea (sugar ok, NO MILK/CREAM OR CREAMERS) regular and decaf                             ?Plain Jell-O (NO RED)                                           ?Fruit ices (not with fruit pulp, NO RED)                                     ?Popsicles (NO RED)                                                                  ?Juice: apple, WHITE grape, WHITE cranberry ?Sports drinks like Gatorade (NO RED) ?Clear broth(vegetable,chicken,beef) ?  ?  ?The day of surgery:  ?Drink ONE (1) Pre-Surgery Clear Ensure at 4:15 AM the morning of surgery. Drink in one sitting. Do not sip.  ?This drink was given to you during your hospital  ?pre-op appointment visit. ?Nothing else to drink after completing the  ?Pre-Surgery Clear Ensure. ?  ?       If you have questions, please contact your surgeon?s office. ? ? ?FOLLOW BOWEL PREP AND ANY ADDITIONAL PRE OP INSTRUCTIONS YOU RECEIVED  FROM YOUR SURGEON'S OFFICE!!! ?  ?  ?Oral Hygiene is also important to reduce your risk of infection.                                    ?Remember - BRUSH YOUR TEETH THE MORNING OF SURGERY WITH YOUR REGULAR TOOTHPASTE ? ? Do NOT smoke after Midnight ? ? Take these medicines the morning of surgery with A SIP OF WATER: Inhalers, Atorvastatin, Zetia, Gabapentin, Omeprazole.  ? ?  Bring CPAP mask and tubing day of surgery. ?                  ?           You may not have any metal on your body including hair pins, jewelry, and body piercing ? ?           Do not wear make-up, lotions, powders, perfumes, or deodorant ? ?Do not wear nail polish including gel and S&S, artificial/acrylic nails, or any other type of covering on natural nails including finger and toenails. If you have artificial nails, gel coating, etc. that needs to be removed by a nail salon please have this removed prior to surgery or surgery may need to be canceled/ delayed if the surgeon/ anesthesia feels like they are unable to be safely monitored.  ? ?Do not shave  48 hours prior to surgery.  ? ? Do not bring valuables to the hospital. [facility] NOT ?            RESPONSIBLE   FOR VALUABLES. ? ? Contacts, dentures or bridgework may not be worn into surgery. ? ? Bring small overnight bag day of surgery. ?  ? Special Instructions: Bring a copy of your healthcare power of attorney and living will documents         the day of surgery if you haven't scanned them before. ? ?            Please read over the following fact sheets you were given: IF [place] [phone]- Apolonio Schneiders ? ?   [place] - Preparing for Surgery ?Before surgery, you can play an important role.  Because skin is not sterile, your skin needs to be as free of germs as possible.  You can reduce the number of germs on your skin by washing with CHG (chlorahexidine gluconate) soap before surgery.  CHG is an antiseptic cleaner which kills  germs and bonds with the skin to continue killing germs even after washing. ?Please DO NOT use if you have an allergy to CHG or antibacterial soaps.  If your skin becomes reddened/irritated stop using the CHG and inform your nurse when you arrive at Short Stay. ?Do not shave (including legs and underarms) for at least 48 hours prior to the first CHG shower.  You may shave your face/neck. ? ?Please follow these instructions carefully: ? 1.  Shower with CHG Soap the night before surgery and the  morning of surgery. ? 2.  If you choose to wash your hair, wash your hair first as usual with your normal  shampoo. ? 3.  After you shampoo, rinse your hair and body thoroughly to remove the shampoo.                            ? 4.  Use CHG as you would any other liquid soap.  You can apply chg directly to the skin and wash.  Gently with a scrungie or clean washcloth. ? 5.  Apply the CHG Soap to your body ONLY FROM THE NECK DOWN.   Do   not use on face/ open      ?                     Wound or open sores. Avoid contact with eyes, ears mouth and   genitals (private parts).  ?  Wash face,  Genitals (private parts) with your normal soap. ?            6.  Wash thoroughly, paying special attention to the area where your    surgery  will be performed. ? 7.  Thoroughly rinse your body with warm water from the neck down. ? 8.  DO NOT shower/wash with your normal soap after using and rinsing off the CHG Soap. ?               9.  Pat yourself dry with a clean towel. ?           10.  Wear clean pajamas. ?           11.  Place clean sheets on your bed the night of your first shower and do not  sleep with pets. ?Day of Surgery : ?Do not apply any lotions/deodorants the morning of surgery.  Please wear clean clothes to the hospital/surgery center. ? ?FAILURE TO FOLLOW THESE INSTRUCTIONS MAY RESULT IN THE CANCELLATION OF YOUR SURGERY ? ?PATIENT SIGNATURE_________________________________ ? ?NURSE  SIGNATURE__________________________________ ? ?________________________________________________________________________  ? ?Incentive Spirometer ? ?An incentive spirometer is a tool that can help keep your lungs clear and active. This tool measures how well you are filling your lungs with each breath. Taking long deep breaths may help reverse or decrease the chance of developing breathing (pulmonary) problems (especially infection) following: ?A long period of time when you are unable to move or be active. ?BEFORE THE PROCEDURE  ?If the spirometer includes an indicator to show your best effort, your nurse or respiratory therapist will set it to a desired goal. ?If possible, sit up straight or lean slightly forward. Try not to slouch. ?Hold the incentive spirometer in an upright position. ?INSTRUCTIONS FOR USE  ?Sit on the edge of your bed if possible, or sit up as far as you can in bed or on a chair. ?Hold the incentive spirometer in an upright position. ?Breathe out normally. ?Place the mouthpiece in your mouth and seal your lips tightly around it. ?Breathe in slowly and as deeply as possible, raising the piston or the ball toward the top of the column. ?Hold your breath for 3-5 seconds or for as long as possible. Allow the piston or ball to fall to the bottom of the column. ?Remove the mouthpiece from your mouth and breathe out normally. ?Rest for a few seconds and repeat Steps 1 through 7 at least 10 times every 1-2 hours when you are awake. Take your time and take a few normal breaths between deep breaths. ?The spirometer may include an indicator to show your best effort. Use the indicator as a goal to work toward during each repetition. ?After each set of 10 deep breaths, practice coughing to be sure your lungs are clear. If you have an incision (the cut made at the time of surgery), support your incision when coughing by placing a pillow or rolled up towels firmly against it. ?Once you are able to get out of  bed, walk around indoors and cough well. You may stop using the incentive spirometer when instructed by your caregiver.  ?RISKS AND COMPLICATIONS ?Take your time so you do not get dizzy or light-headed. ?If you are in pain, you may n

## 2022-02-03 NOTE — Telephone Encounter (Signed)
Spoke with patient and PCP office. Found office note from PCP in Media tab.  Will postpone surgery per PCP. ?

## 2022-02-03 NOTE — Progress Notes (Signed)
COVID Vaccine Completed: ?Date COVID Vaccine completed: ?Has received booster: ?COVID vaccine manufacturer: [place]  ? ?Date of COVID positive in last 90 days: ? ?PCP - Gala Romney, MD ?Cardiologist - Fredrik Cove, DO ? ?Cardiac clearance?? ? ?Chest x-ray - 05/27/21 Epic ?EKG - 12/30/21 Epic ?Stress Test - 01/20/22 Epic ?ECHO - 01/10/22 Epic ?Cardiac Cath -  ?Pacemaker/ICD device last checked: ?Spinal Cord Stimulator: ? ?Bowel Prep -  ? ?Sleep Study -  ?CPAP -  ? ?Fasting Blood Sugar -  ?Checks Blood Sugar _____ times a day ? ?Blood Thinner Instructions: ?Aspirin Instructions: ?Last Dose: ? ?Activity level:  Can go up a flight of stairs and perform activities of daily living without stopping and without symptoms of chest pain or shortness of breath. ?  Able to exercise without symptoms ? ?Unable to go up a flight of stairs without symptoms of  ?   ? ?Anesthesia review: chest pain, HTN, asthma ? ?Patient denies shortness of breath, fever, cough and chest pain at PAT appointment ? ? ?Patient verbalized understanding of instructions that were given to them at the PAT appointment. Patient was also instructed that they will need to review over the PAT instructions again at home before surgery.  ?

## 2022-02-04 ENCOUNTER — Ambulatory Visit: Payer: Medicare Other | Attending: Specialist

## 2022-02-04 DIAGNOSIS — M436 Torticollis: Secondary | ICD-10-CM | POA: Insufficient documentation

## 2022-02-04 DIAGNOSIS — R293 Abnormal posture: Secondary | ICD-10-CM | POA: Insufficient documentation

## 2022-02-04 DIAGNOSIS — M542 Cervicalgia: Secondary | ICD-10-CM | POA: Insufficient documentation

## 2022-02-04 DIAGNOSIS — R252 Cramp and spasm: Secondary | ICD-10-CM | POA: Diagnosis present
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
## 2022-02-07 ENCOUNTER — Other Ambulatory Visit: Payer: Self-pay | Admitting: Gastroenterology

## 2022-02-08 ENCOUNTER — Other Ambulatory Visit: Payer: Self-pay | Admitting: Family Medicine

## 2022-02-10 ENCOUNTER — Telehealth: Payer: Self-pay | Admitting: Allergy & Immunology

## 2022-02-10 ENCOUNTER — Other Ambulatory Visit: Payer: Self-pay | Admitting: *Deleted

## 2022-02-10 ENCOUNTER — Telehealth: Payer: Self-pay | Admitting: *Deleted

## 2022-02-10 MED ORDER — FAMOTIDINE 20 MG PO TABS: 20.0000 mg | ORAL_TABLET | Freq: Two times a day (BID) | ORAL | 5 refills | Status: DC

## 2022-02-10 NOTE — Telephone Encounter (Signed)
Called and spoke with the patient, she stated that she doesn't feel like the Omeprazole is working for her anymore, she has tried Omeprazole, Pepcid, and Pantoprazole. She would like to try something different.  ?

## 2022-02-10 NOTE — Telephone Encounter (Signed)
Called patient and advised of Anne's recommendation. Patient verbalized understanding.  ?

## 2022-02-10 NOTE — Telephone Encounter (Signed)
Please have her begin or increase saline nasal rinses and make sure she has great Flonase application technique. She can change her antihistamine if she has been on the same one for longer than a few months. Thank you

## 2022-02-10 NOTE — Telephone Encounter (Signed)
Called and spoke with patient and she states that the heart burn comes and goes. I sent in new prescription, patient verbalized understanding, she states for the last few months she has had a mucous in the back of her throat and with the onset of pollen season it has become worse. She is taking her daily antihistamine along with the flonase and astelin. She is wondering if there is something else she can do.  ?

## 2022-02-10 NOTE — Telephone Encounter (Signed)
Patient called stating that she received her omeprazole from the pharmacy and the capsules were smaller than usual. [name] discussed this with the pharmacist and the pharmacist assured her that the capsules are smaller but they are still 30m. Patient stated that she is not a pharmacist but she is not stupid either. Patient states that what the pharmacist gave her is not working. Patient requests a call back from nurse to discuss this. Her best phone number to reach her is [phone] ?

## 2022-02-10 NOTE — Telephone Encounter (Signed)
Please find out how often she is having episodes of heartburn. One possible explanation of the different sized tablets could be that insurance companied generally contract with a drug manufacturer with cost as one of the bargaining points. When an Radio producer with a Risk analyst of a drug the tablet can look slightly different. It sounds as though she feels uncomfortable with the tablets that do not look the same as she is used to so lets have her switch to a different medication. Please have her begin famotidine 20 mg twice a day for reflux. This will take the place of omeprazole. Please also have her check in with her gastroenterologist to make sure we are in line with her GI treatment plan. Thank you

## 2022-02-10 NOTE — Telephone Encounter (Signed)
Called and left a voicemail asking for patient to return call to discuss.  °

## 2022-02-10 NOTE — Telephone Encounter (Signed)
error 

## 2022-02-12 ENCOUNTER — Ambulatory Visit: Payer: Medicare Other

## 2022-02-12 DIAGNOSIS — M542 Cervicalgia: Secondary | ICD-10-CM | POA: Diagnosis not present

## 2022-02-12 DIAGNOSIS — M436 Torticollis: Secondary | ICD-10-CM

## 2022-02-12 DIAGNOSIS — R293 Abnormal posture: Secondary | ICD-10-CM

## 2022-02-12 DIAGNOSIS — R252 Cramp and spasm: Secondary | ICD-10-CM
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
## 2022-02-14 ENCOUNTER — Ambulatory Visit: Admit: 2022-02-14 | Payer: Medicare Other | Admitting: Orthopaedic Surgery

## 2022-02-14 SURGERY — ARTHROPLASTY, HIP, TOTAL, ANTERIOR APPROACH
Anesthesia: Choice | Site: Hip | Laterality: Right

## 2022-02-17 ENCOUNTER — Ambulatory Visit (INDEPENDENT_AMBULATORY_CARE_PROVIDER_SITE_OTHER): Payer: Medicare Other | Admitting: *Deleted

## 2022-02-17 DIAGNOSIS — J309 Allergic rhinitis, unspecified: Secondary | ICD-10-CM | POA: Diagnosis not present

## 2022-02-17 NOTE — Telephone Encounter (Signed)
Patient states that the Famotidine that she started last week helped for a little bit and then because ineffective. She developed terrible heartburn over the weekend. She has tried and failed Omeprazole, Pepcid, and Pantoprazole ?

## 2022-02-18 ENCOUNTER — Ambulatory Visit: Payer: Self-pay | Admitting: Cardiology

## 2022-02-18 DIAGNOSIS — I10 Essential (primary) hypertension: Secondary | ICD-10-CM

## 2022-02-18 NOTE — Telephone Encounter (Signed)
I think she needs to talk to her Gastroenterologist about this. She has one and he or she would be much more useful to address this. ? ?Salvatore Marvel, MD ?Allergy and [place] [facility] ? ?

## 2022-02-19 ENCOUNTER — Ambulatory Visit: Payer: Medicare Other

## 2022-02-19 DIAGNOSIS — M436 Torticollis: Secondary | ICD-10-CM

## 2022-02-19 DIAGNOSIS — M542 Cervicalgia: Secondary | ICD-10-CM | POA: Diagnosis not present

## 2022-02-19 NOTE — Telephone Encounter (Signed)
Called and spoke with patient and advised. Patient verbalized understanding.  ?
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
## 2022-02-20 ENCOUNTER — Telehealth: Payer: Self-pay | Admitting: Orthopaedic Surgery

## 2022-02-21 ENCOUNTER — Telehealth: Payer: Self-pay | Admitting: Orthopaedic Surgery

## 2022-02-21 ENCOUNTER — Other Ambulatory Visit: Payer: Self-pay | Admitting: Orthopaedic Surgery

## 2022-02-21 MED ORDER — TIZANIDINE HCL 4 MG PO TABS: 4.0000 mg | ORAL_TABLET | Freq: Three times a day (TID) | ORAL | 0 refills | Status: DC | PRN

## 2022-02-21 NOTE — Telephone Encounter (Signed)
I called patient and discussed.  Will confirm okay to reschedule with PCP office and call her back.  Called Dr. Leontine Locket office and left voice mail for return call. ?

## 2022-02-21 NOTE — Telephone Encounter (Signed)
Please advise 

## 2022-02-21 NOTE — Telephone Encounter (Signed)
I advised pt.Pt said she doesn't have sx scheduled any time soon. She hasnt heard back from [place]. She stated she is in so much pain and tylenol and ibu isnt helping. It is waking her up at night. She is begging for something for pain.  Please advise ?

## 2022-02-21 NOTE — Telephone Encounter (Signed)
Patient called. Says she needs something stronger for pain. Nothing is working. Would like a call. Her call back number is [phone] ?

## 2022-02-24 NOTE — Telephone Encounter (Signed)
Patient is stating that the medication tizanidine is not helpful she is in pain and barely can get out of bed but the old medication Codeine 30 mg was working fine and she would like that to help with the back pain. ?

## 2022-02-26 ENCOUNTER — Ambulatory Visit: Payer: Medicare Other
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
## 2022-02-27 ENCOUNTER — Encounter: Payer: Medicare Other | Admitting: Orthopaedic Surgery

## 2022-02-28 ENCOUNTER — Ambulatory Visit (INDEPENDENT_AMBULATORY_CARE_PROVIDER_SITE_OTHER): Payer: Medicare Other

## 2022-02-28 DIAGNOSIS — J309 Allergic rhinitis, unspecified: Secondary | ICD-10-CM | POA: Diagnosis not present

## 2022-02-28 NOTE — Telephone Encounter (Signed)
Obtained clearance from PCP, Dr. Jonelle Sidle.  Awaiting clearance from cardiologist, Dr. Terri Skains.  That office has confirmed they have it and are waiting for it from the doctor.  I called patient and advised I would reschedule her once this was received. ?

## 2022-03-04 ENCOUNTER — Ambulatory Visit (INDEPENDENT_AMBULATORY_CARE_PROVIDER_SITE_OTHER): Payer: Medicare Other

## 2022-03-04 DIAGNOSIS — J309 Allergic rhinitis, unspecified: Secondary | ICD-10-CM

## 2022-03-05 ENCOUNTER — Ambulatory Visit: Payer: Medicare Other

## 2022-03-10 ENCOUNTER — Telehealth: Payer: Self-pay

## 2022-03-10 ENCOUNTER — Ambulatory Visit (INDEPENDENT_AMBULATORY_CARE_PROVIDER_SITE_OTHER): Payer: Medicare Other

## 2022-03-10 ENCOUNTER — Telehealth: Payer: Self-pay | Admitting: Cardiology

## 2022-03-10 DIAGNOSIS — J309 Allergic rhinitis, unspecified: Secondary | ICD-10-CM

## 2022-03-10 NOTE — Telephone Encounter (Signed)
Patient states she has been waiting on a clearance form to clear her for hip surgery. She cannot have this completed until [facility] receives the clearance. It was sent to our office on 02/26/22. Once this is faxed, I can let her know. ?

## 2022-03-10 NOTE — Telephone Encounter (Signed)
Patient has been having hoariness with mucus in her throat since the weekend. She has been taking mucinex along with all of her allergy medications. She has not needed to use the albuterol or Symbicort inhaler for any asthma flares. She stated no change in her symptoms since being on the mucinex. Please advise on any other mediation she can add on.  ?

## 2022-03-11 ENCOUNTER — Encounter: Payer: Self-pay | Admitting: Cardiology

## 2022-03-11 NOTE — Telephone Encounter (Signed)
She could try some OTC cold medication to see if this can help - something with pseudoephedrine or phenylephrine. Continue with the Mucinex. Sometimes the antihistamines can counteract the activity of Mucinex (one dried while the other makes things more liquid and flowing), so she could hold her antihistamine for a few days. ? ?Any fever? COVID testing?  ? ?Salvatore Marvel, MD ?Allergy and [place] [facility] ? ?

## 2022-03-11 NOTE — Telephone Encounter (Signed)
Called patient - DOB verified - advised of provider notation.  ? ?Patient advised the following:  ? ?* She has not done a Covid home test  ?* Has not been running a fever ? ?Patient verbalized understanding, no further questions. ? ? ?

## 2022-03-11 NOTE — Telephone Encounter (Signed)
Letter is sent to Dr. Jean Rosenthal via epic. ? ?Sorry for the delay. ? ?Please reach out if any questions or concerns arise. ? ?Rex Kras, DO, FACC ? ?Pager: [phone] ?Office: [phone] ?

## 2022-03-12 ENCOUNTER — Ambulatory Visit: Payer: Medicare Other

## 2022-03-12 NOTE — Telephone Encounter (Signed)
Noted.  Lets call her tomorrow morning and see how she is feeling.  We might just have to add her to our nurse practitioner schedule for a sick visit. ? ?Salvatore Marvel, MD ?Allergy and [place] [facility] ? ?

## 2022-03-13 NOTE — Telephone Encounter (Signed)
Called and spoke to patient. She stated that she still has it but it is tolerable and does not feel that she needs to be seen. However, she said she will call and make an appointment if it got worse. Patient was advised to definitely call and make an appointment to be seen if it got worse. ?

## 2022-03-14 NOTE — Telephone Encounter (Signed)
Noted - sounds good!  ? ?Salvatore Marvel, MD ?Allergy and [place] [facility] ? ?

## 2022-03-16 ENCOUNTER — Other Ambulatory Visit: Payer: Self-pay | Admitting: Family Medicine

## 2022-03-18 ENCOUNTER — Ambulatory Visit (INDEPENDENT_AMBULATORY_CARE_PROVIDER_SITE_OTHER): Payer: Medicare Other

## 2022-03-18 DIAGNOSIS — J309 Allergic rhinitis, unspecified: Secondary | ICD-10-CM

## 2022-03-19 ENCOUNTER — Ambulatory Visit: Payer: Medicare Other

## 2022-04-02 ENCOUNTER — Ambulatory Visit: Payer: Medicare Other | Attending: Specialist

## 2022-04-02 DIAGNOSIS — M542 Cervicalgia: Secondary | ICD-10-CM | POA: Insufficient documentation

## 2022-04-02 DIAGNOSIS — R293 Abnormal posture: Secondary | ICD-10-CM | POA: Diagnosis present

## 2022-04-02 DIAGNOSIS — M436 Torticollis: Secondary | ICD-10-CM | POA: Diagnosis present

## 2022-04-02 DIAGNOSIS — R252 Cramp and spasm: Secondary | ICD-10-CM | POA: Diagnosis present
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
## 2022-04-06 ENCOUNTER — Other Ambulatory Visit: Payer: Self-pay | Admitting: Specialist

## 2022-04-10 ENCOUNTER — Telehealth: Payer: Self-pay | Admitting: Allergy & Immunology

## 2022-04-10 MED ORDER — ALBUTEROL SULFATE HFA 108 (90 BASE) MCG/ACT IN AERS: 2.0000 | INHALATION_SPRAY | Freq: Four times a day (QID) | RESPIRATORY_TRACT | 1 refills | Status: DC | PRN

## 2022-04-10 NOTE — Telephone Encounter (Signed)
Albuterol inhaler has been sent in

## 2022-04-10 NOTE — Telephone Encounter (Signed)
Patient called and needs to have albuterol inhaler called into [facility] on [address]. Has appointment on 06/26/2022. //[phone].

## 2022-04-11 ENCOUNTER — Other Ambulatory Visit: Payer: Self-pay | Admitting: *Deleted

## 2022-04-11 ENCOUNTER — Telehealth: Payer: Self-pay | Admitting: Allergy & Immunology

## 2022-04-11 MED ORDER — ALBUTEROL SULFATE HFA 108 (90 BASE) MCG/ACT IN AERS: INHALATION_SPRAY | RESPIRATORY_TRACT | 1 refills | Status: DC

## 2022-04-11 NOTE — Telephone Encounter (Signed)
RX for generic Proventil sent to pharmacy and left a message informing patient.

## 2022-04-11 NOTE — Telephone Encounter (Signed)
Heather from [employer] called and stated that ventolin was called in for patient but insurance does not cover this drug. She stated that available medications for this patient's insurance is generic Proventil 6.7g and 8.5g which is a tier 2 drug and would be cheaper for patient. Brand name [employer]ne[employer] is covered also but it is a tier 3 drug and is more expensive. Pharmacy is [facility] [phone]. Patient requests a call when this situation is taken care of.

## 2022-04-11 NOTE — Telephone Encounter (Signed)
Patient called back in without listening to her voicemail - DOB verified - advised of below message. Patient verbalized understanding, no further questions.

## 2022-04-15 ENCOUNTER — Other Ambulatory Visit: Payer: Self-pay | Admitting: Orthopaedic Surgery

## 2022-04-15 ENCOUNTER — Ambulatory Visit (INDEPENDENT_AMBULATORY_CARE_PROVIDER_SITE_OTHER): Payer: Medicare Other

## 2022-04-15 DIAGNOSIS — J309 Allergic rhinitis, unspecified: Secondary | ICD-10-CM | POA: Diagnosis not present

## 2022-04-21 ENCOUNTER — Ambulatory Visit (INDEPENDENT_AMBULATORY_CARE_PROVIDER_SITE_OTHER): Payer: Medicare Other | Admitting: Specialist

## 2022-04-21 ENCOUNTER — Other Ambulatory Visit: Payer: Medicare HMO

## 2022-04-21 ENCOUNTER — Encounter: Payer: Self-pay | Admitting: Specialist

## 2022-04-21 ENCOUNTER — Ambulatory Visit: Payer: Medicare HMO | Admitting: Hematology and Oncology

## 2022-04-21 VITALS — BP 133/85 | HR 80 | Ht 62.0 in | Wt 145.0 lb

## 2022-04-21 DIAGNOSIS — M47816 Spondylosis without myelopathy or radiculopathy, lumbar region: Secondary | ICD-10-CM | POA: Diagnosis not present

## 2022-04-21 DIAGNOSIS — M51369 Other intervertebral disc degeneration, lumbar region without mention of lumbar back pain or lower extremity pain: Secondary | ICD-10-CM

## 2022-04-21 DIAGNOSIS — M5136 Other intervertebral disc degeneration, lumbar region: Secondary | ICD-10-CM | POA: Diagnosis not present

## 2022-04-21 DIAGNOSIS — M4005 Postural kyphosis, thoracolumbar region: Secondary | ICD-10-CM | POA: Diagnosis not present

## 2022-04-21 DIAGNOSIS — R200 Anesthesia of skin: Secondary | ICD-10-CM

## 2022-04-21 DIAGNOSIS — Z981 Arthrodesis status: Secondary | ICD-10-CM

## 2022-04-21 DIAGNOSIS — R202 Paresthesia of skin: Secondary | ICD-10-CM

## 2022-04-21 DIAGNOSIS — M25551 Pain in right hip: Secondary | ICD-10-CM

## 2022-04-21 MED ORDER — GABAPENTIN 100 MG PO CAPS: 100.0000 mg | ORAL_CAPSULE | Freq: Every day | ORAL | 3 refills | Status: DC

## 2022-04-21 MED ORDER — ETODOLAC 400 MG PO TABS: 400.0000 mg | ORAL_TABLET | Freq: Two times a day (BID) | ORAL | 3 refills | Status: DC

## 2022-04-21 NOTE — Patient Instructions (Signed)
Avoid frequent bending and stooping  No lifting greater than 10 lbs. May use ice or moist heat for pain. Weight loss is of benefit. Best medication for lumbar disc disease is arthritis medications like motrin, celebrex and naprosyn. Exercise is important to improve your indurance and does allow people to function better inspite of back pain.  Avoid overhead lifting and overhead use of the arms. Do not lift greater than 5 lbs. Adjust head rest in vehicle to prevent hyperextension if rear ended. Take extra precautions to avoid falling. Risks of surgery include risks of infection, bleeding and risks to the spinal cord and  Risks of sore throat and difficulty swallowing which should  Improve over the next 4-6 weeks following surgery. Surgery is indicated due to upper extremity radiculopathy. In the future surgery at adjacent levels may be necessary but these levels do not appear to be related to your current symptoms or signs.
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
## 2022-04-27 NOTE — Progress Notes (Signed)
Rescheduled

## 2022-04-28 ENCOUNTER — Inpatient Hospital Stay: Payer: Medicare Other

## 2022-04-28 ENCOUNTER — Inpatient Hospital Stay: Payer: Medicare Other | Admitting: Hematology and Oncology

## 2022-04-28 DIAGNOSIS — D500 Iron deficiency anemia secondary to blood loss (chronic): Secondary | ICD-10-CM

## 2022-05-16 ENCOUNTER — Other Ambulatory Visit: Payer: Self-pay

## 2022-05-16 ENCOUNTER — Inpatient Hospital Stay: Payer: Medicare Other | Attending: Hematology and Oncology

## 2022-05-16 ENCOUNTER — Inpatient Hospital Stay (HOSPITAL_BASED_OUTPATIENT_CLINIC_OR_DEPARTMENT_OTHER): Payer: Medicare Other | Admitting: Hematology and Oncology

## 2022-05-16 VITALS — BP 122/90 | HR 89 | Temp 97.5°F | Resp 17 | Wt 157.8 lb

## 2022-05-16 DIAGNOSIS — I10 Essential (primary) hypertension: Secondary | ICD-10-CM | POA: Insufficient documentation

## 2022-05-16 DIAGNOSIS — K5090 Crohn's disease, unspecified, without complications: Secondary | ICD-10-CM | POA: Insufficient documentation

## 2022-05-16 DIAGNOSIS — Z90710 Acquired absence of both cervix and uterus: Secondary | ICD-10-CM | POA: Insufficient documentation

## 2022-05-16 DIAGNOSIS — D500 Iron deficiency anemia secondary to blood loss (chronic): Secondary | ICD-10-CM | POA: Diagnosis present

## 2022-05-16 LAB — CMP (CANCER CENTER ONLY)
ALT: 31 U/L (ref 0–44)
AST: 23 U/L (ref 15–41)
Albumin: 4.1 g/dL (ref 3.5–5.0)
Alkaline Phosphatase: 142 U/L — ABNORMAL HIGH (ref 38–126)
Anion gap: 9 (ref 5–15)
BUN: 10 mg/dL (ref 8–23)
CO2: 24 mmol/L (ref 22–32)
Calcium: 9.1 mg/dL (ref 8.9–10.3)
Chloride: 103 mmol/L (ref 98–111)
Creatinine: 0.73 mg/dL (ref 0.44–1.00)
GFR, Estimated: >60 mL/min (ref >60)
Glucose, Bld: 219 mg/dL — ABNORMAL HIGH (ref 70–99)
Potassium: 4.1 mmol/L (ref 3.5–5.1)
Sodium: 136 mmol/L (ref 135–145)
Total Bilirubin: 0.3 mg/dL (ref 0.3–1.2)
Total Protein: 6.9 g/dL (ref 6.5–8.1)

## 2022-05-16 LAB — RETIC PANEL
Immature Retic Fract: 9.7 % (ref 2.3–15.9)
RBC.: 4.89 MIL/uL (ref 3.87–5.11)
Retic Count, Absolute: 32.8 10*3/uL (ref 19.0–186.0)
Retic Ct Pct: 0.7 % (ref 0.4–3.1)
Reticulocyte Hemoglobin: 32.3 pg (ref >27.9)

## 2022-05-16 LAB — CBC WITH DIFFERENTIAL (CANCER CENTER ONLY)
Abs Immature Granulocytes: 0.05 10*3/uL (ref 0.00–0.07)
Basophils Absolute: 0.1 10*3/uL (ref 0.0–0.1)
Basophils Relative: 0 %
Eosinophils Absolute: 0.2 10*3/uL (ref 0.0–0.5)
Eosinophils Relative: 2 %
HCT: 40.6 % (ref 36.0–46.0)
Hemoglobin: 13.2 g/dL (ref 12.0–15.0)
Immature Granulocytes: 0 %
Lymphocytes Relative: 23 %
Lymphs Abs: 2.8 10*3/uL (ref 0.7–4.0)
MCH: 26.5 pg (ref 26.0–34.0)
MCHC: 32.5 g/dL (ref 30.0–36.0)
MCV: 81.4 fL (ref 80.0–100.0)
Monocytes Absolute: 0.7 10*3/uL (ref 0.1–1.0)
Monocytes Relative: 6 %
Neutro Abs: 8.1 10*3/uL — ABNORMAL HIGH (ref 1.7–7.7)
Neutrophils Relative %: 69 %
Platelet Count: 337 10*3/uL (ref 150–400)
RBC: 4.99 MIL/uL (ref 3.87–5.11)
RDW: 14.1 % (ref 11.5–15.5)
WBC Count: 11.8 10*3/uL — ABNORMAL HIGH (ref 4.0–10.5)
nRBC: 0 % (ref 0.0–0.2)

## 2022-05-16 LAB — IRON AND IRON BINDING CAPACITY (CC-WL,HP ONLY)
Iron: 72 ug/dL (ref 28–170)
Saturation Ratios: 19 % (ref 10.4–31.8)
TIBC: 382 ug/dL (ref 250–450)
UIBC: 310 ug/dL (ref 148–442)

## 2022-05-16 LAB — FERRITIN: Ferritin: 26 ng/mL (ref 11–307)
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
## 2022-05-19 ENCOUNTER — Other Ambulatory Visit: Payer: Self-pay | Admitting: Orthopaedic Surgery

## 2022-05-22 ENCOUNTER — Encounter: Payer: Self-pay | Admitting: Specialist

## 2022-05-22 ENCOUNTER — Ambulatory Visit (INDEPENDENT_AMBULATORY_CARE_PROVIDER_SITE_OTHER): Payer: Medicare Other | Admitting: Specialist

## 2022-05-22 ENCOUNTER — Ambulatory Visit (INDEPENDENT_AMBULATORY_CARE_PROVIDER_SITE_OTHER): Payer: Medicare Other | Admitting: *Deleted

## 2022-05-22 VITALS — BP 140/79 | HR 79 | Ht 62.0 in | Wt 158.0 lb

## 2022-05-22 DIAGNOSIS — M5136 Other intervertebral disc degeneration, lumbar region: Secondary | ICD-10-CM | POA: Diagnosis not present

## 2022-05-22 DIAGNOSIS — R200 Anesthesia of skin: Secondary | ICD-10-CM | POA: Diagnosis not present

## 2022-05-22 DIAGNOSIS — J309 Allergic rhinitis, unspecified: Secondary | ICD-10-CM

## 2022-05-22 DIAGNOSIS — M25551 Pain in right hip: Secondary | ICD-10-CM

## 2022-05-22 DIAGNOSIS — M47816 Spondylosis without myelopathy or radiculopathy, lumbar region: Secondary | ICD-10-CM | POA: Diagnosis not present

## 2022-05-22 DIAGNOSIS — R202 Paresthesia of skin: Secondary | ICD-10-CM
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
## 2022-05-26 ENCOUNTER — Other Ambulatory Visit: Payer: Self-pay

## 2022-06-03 ENCOUNTER — Other Ambulatory Visit: Payer: Self-pay | Admitting: Orthopaedic Surgery

## 2022-06-05 ENCOUNTER — Ambulatory Visit (INDEPENDENT_AMBULATORY_CARE_PROVIDER_SITE_OTHER): Payer: Medicare Other | Admitting: Allergy & Immunology

## 2022-06-05 ENCOUNTER — Other Ambulatory Visit: Payer: Self-pay

## 2022-06-05 ENCOUNTER — Encounter: Payer: Self-pay | Admitting: Allergy & Immunology

## 2022-06-05 VITALS — BP 128/76 | HR 78 | Temp 98.2°F | Resp 17

## 2022-06-05 DIAGNOSIS — D235 Other benign neoplasm of skin of trunk: Secondary | ICD-10-CM | POA: Diagnosis not present

## 2022-06-05 DIAGNOSIS — K219 Gastro-esophageal reflux disease without esophagitis: Secondary | ICD-10-CM | POA: Diagnosis not present

## 2022-06-05 DIAGNOSIS — J3089 Other allergic rhinitis: Secondary | ICD-10-CM | POA: Diagnosis not present

## 2022-06-05 DIAGNOSIS — J4540 Moderate persistent asthma, uncomplicated: Secondary | ICD-10-CM

## 2022-06-05 DIAGNOSIS — J302 Other seasonal allergic rhinitis: Secondary | ICD-10-CM

## 2022-06-05 DIAGNOSIS — K5010 Crohn's disease of large intestine without complications: Secondary | ICD-10-CM | POA: Insufficient documentation

## 2022-06-05 DIAGNOSIS — K625 Hemorrhage of anus and rectum: Secondary | ICD-10-CM | POA: Insufficient documentation

## 2022-06-05 DIAGNOSIS — R197 Diarrhea, unspecified: Secondary | ICD-10-CM | POA: Insufficient documentation

## 2022-06-05 MED ORDER — CLOBETASOL PROPIONATE 0.05 % EX OINT: 1.0000 | TOPICAL_OINTMENT | Freq: Two times a day (BID) | CUTANEOUS | 2 refills | Status: DC

## 2022-06-05 NOTE — Progress Notes (Signed)
FOLLOW UP  Date of Service/Encounter:  06/06/22   Assessment:   Moderate persistent asthma, uncomplicated   Perennial and seasonal allergic rhinitis (grasses, ragweed, weeds, molds, cat, dog, dust mite) - on allergen immunotherapy (started June 2019 and reached maintenance in October 2019)  Back cyst - starting prednisone and topical steroid (recommended talking to Dermatology about this as well)    Heart murmur and hypertension - followed by Cardiology  Iron deficiency anemia - followed by hematology  Inflammatory bowel disease - followed by Dr. Tarri Glenn  GERD   Chronic pain   Disabled status  Plan/Recommendations:   1. Moderate persistent asthma, uncomplicated - Spirometry (lung testing) looks awesome today!  - We are not going to make any changes at this time.  - Daily controller medication(s): Singulair (montelukast) 33m daily - Prior to physical activity: albuterol 2 puffs 10-15 minutes before physical activity. - Rescue medications: albuterol 4 puffs every 4-6 hours as needed - Changes during respiratory infections or worsening symptoms: Add on Symbicort to 2 puffs twice daily for TWO WEEKS. - Asthma control goals:  * Full participation in all desired activities (may need albuterol before activity) * Albuterol use two time or less a week on average (not counting use with activity) * Cough interfering with sleep two time or less a month * Oral steroids no more than once a year * No hospitalizations  2. Perennial and seasonal allergic rhinitis - Continue with allergy shots at the same schedule. - Continue with fluticasone nasal spray daily to help control runny nose as needed.  - Continue with azelastine nasal spray one spray per nostril up to twice daily as needed. - Continue with your cetirizine and the montelukast.   3. Lesions on back - Add on topical clobetasol ointment twice daily and use consistently for two weeks. - Apply via a back scratcher and see  how that works.  - Start prednisone 177mtwice daily for one week to see if this helps at all.  - Call usKoreaonday with an update.   4. Return in about 6 months (around 12/06/2022).    Subjective:   Sheila Vincent a [age].o. female presenting today for follow up of  Chief Complaint  Patient presents with   Asthma   Allergic Rhinitis     KiKataya Vincent a history of the following: Patient Active Problem List   Diagnosis Date Noted   [place]/01/2022   Diarrhea 06/05/2022   Rectal bleeding 06/05/2022   IBD (inflammatory bowel disease) 08/08/2021   Symptomatic anemia 08/08/2021   Iron deficiency anemia due to chronic blood loss 08/08/2021   Unilateral primary osteoarthritis, right hip 01/18/2020   Laryngopharyngeal reflux (LPR) 09/09/2019   Sudden right hearing loss 09/09/2019   Tinnitus of right ear 09/09/2019   Eustachian tube dysfunction, right 09/05/2019   Bug bite 04/21/2019   Murmur 04/21/2019   De Quervain's syndrome (tenosynovitis) 02/23/2019   Primary osteoarthritis of first carpometacarpal joint of left hand 10/11/2018   GERD (gastroesophageal reflux disease) 04/06/2018   Cough, persistent 04/06/2018   Hypertension 03/30/2018   Perennial and seasonal allergic rhinitis 07/14/2015   Moderate persistent asthma 07/14/2015   Upper airway cough syndrome 12/06/2014   Right foot pain 07/06/2014   Low back pain 07/06/2014    History obtained from: chart review and patient.  Care team PCP: GaElwyn ReachMD Orthopedic surgeon: Dr. JaBasil Dessardiology: Dr. SuRex Krashysical Therapist: DaOctavio Mannsastroenterologist: Dr.  Thornton Park Hematologist: Dr. Narda Rutherford  Urologist: Dr. Alexis Frock  Sheila Vincent is a 61 y.o. female presenting for a follow up visit.  She was last seen in February 2023.  At that time, her asthma is under good control without a controller medication aside from Singulair.  We continue with  albuterol as needed and she had Symbicort that she had during respiratory flares.  Her allergy shots are going well and had allowed her to decrease her asthma controller medications.  We continue with Flonase and Astelin as needed and cetirizine and montelukast.  She had a cardiology referral pending at the last visit due to the presence of a heart murmur.  She did end up seeing a cardiologist for she was diagnosed with aortic stenosis. She also had a carotid duplex to evaluate for carotid artery atherosclerosis.  Since last visit, she has mostly done well, at least from an asthma and allergic standpoint.   She did end up having an echocardiogram that showed normal left ventricular systolic function with an ejection fraction of 60 to 65%.  The left ventricle cavity was normal in size.  There was normal left ventricular wall thickness as well as wall motion.  There was a normal diastolic filling pattern.  There was no valvular heart disease. She tells me that she was not aware of these results. I printed off a copy of her echocardiogram report for her in clinic today.    She is on some hypertenson medications. They are causing her to urinate a lot. She is on Flomax for treatment of bladder spasms.   Asthma/Respiratory Symptom History: Asthma has been under good control with the montelukast alone. We changed her Symbicort to twice daily as needed at the last visit. Her allergy shots have been doing very well on controlling her asthma. She has not been on prednisone for her breathing since the last visit and she has not been using her albuterol much at all.   Allergic Rhinitis Symptom History: She remains on the fluticasone as well as azelastine twice daily. She is also on her cetirizine and her montelukast. Symptoms are overall well controlled on this regimen. She has not been on antibiotics and has had no problems with sinus infections or ear infections.   Ayleah is on allergen immunotherapy. She  receives two injections. Immunotherapy script #1 contains weeds, grasses, molds, dust mites, cat and dog. She currently receives 0.45m of the RED vial (1/100). Immunotherapy script #2 contains molds. She currently receives 0.557mof the RED vial (1/100). She started shots June of 2019 and reached maintenance in October 2019.  She is doing to be having a hip replacement. This is now scheduled for August 25th for now, but this is likely going to be changed. This is going to postponed because her food is going numb and her right hand has an essential tremor. She has been diagnosed with neuropathy or a pinched nerve.   Skin Symptom History: She has been having issues with ithching on her back. One has been there for a "pretty good while" which apparently means weeks or maybe 5 months. The other one showed up relatively recently in the last couple of days.     She is doing well with her Crohn's disease. She was on Lialda for her Crohn's disease, but this was never covered by insurance.  Per the last visit, she was also continued on dicyclomine, omeprazole, and Pentasa 1000 mg 4 times a day.  She has been on Celebrex  for her hip pain.  Per the visit note, she has a history of Crohn's disease, but her biopsies from her colonoscopy in October 2022 were more consistent with ulcerative colitis.  Otherwise, there have been no changes to her past medical history, surgical history, family history, or social history.    Review of Systems  Constitutional: Negative.  Negative for chills, fever, malaise/fatigue and weight loss.  HENT:  Positive for congestion. Negative for ear discharge, ear pain and sinus pain.   Eyes:  Negative for pain, discharge and redness.  Respiratory:  Negative for cough, sputum production, shortness of breath and wheezing.   Cardiovascular: Negative.  Negative for chest pain and palpitations.  Gastrointestinal:  Negative for abdominal pain, constipation, diarrhea, heartburn, nausea and  vomiting.  Skin: Negative.  Negative for itching and rash.  Neurological:  Negative for dizziness and headaches.  Endo/Heme/Allergies:  Positive for environmental allergies. Does not bruise/bleed easily.       Objective:   Blood pressure 128/76, pulse 78, temperature 98.2 F (36.8 C), temperature source Temporal, resp. rate 17, SpO2 98 %. There is no height or weight on file to calculate BMI.    Physical Exam Vitals reviewed.  Constitutional:      Appearance: She is well-developed.     Comments: Very lovely. Cooperative with the exam.   HENT:     Head: Normocephalic and atraumatic.     Right Ear: Tympanic membrane, ear canal and external ear normal.     Left Ear: Tympanic membrane, ear canal and external ear normal.     Nose: No nasal deformity, septal deviation, mucosal edema or rhinorrhea.     Right Turbinates: Enlarged, swollen and pale.     Left Turbinates: Enlarged, swollen and pale.     Right Sinus: No maxillary sinus tenderness or frontal sinus tenderness.     Left Sinus: No maxillary sinus tenderness or frontal sinus tenderness.     Comments: No nasal polyps noted.     Mouth/Throat:     Mouth: Mucous membranes are not pale and not dry.     Pharynx: Uvula midline.  Eyes:     General: Lids are normal. No allergic shiner.       Right eye: No discharge.        Left eye: No discharge.     Conjunctiva/sclera: Conjunctivae normal.     Right eye: Right conjunctiva is not injected. No chemosis.    Left eye: Left conjunctiva is not injected. No chemosis.    Pupils: Pupils are equal, round, and reactive to light.  Cardiovascular:     Rate and Rhythm: Normal rate and regular rhythm.     Heart sounds: Normal heart sounds.  Pulmonary:     Effort: Pulmonary effort is normal. No tachypnea, accessory muscle usage or respiratory distress.     Breath sounds: Normal breath sounds. No wheezing, rhonchi or rales.     Comments: Moving air well in all lung fields. No increased work of  breathing noted.  Chest:     Chest wall: No tenderness.  Lymphadenopathy:     Cervical: No cervical adenopathy.  Skin:    General: Skin is warm.     Capillary Refill: Capillary refill takes less than 2 seconds.     Coloration: Skin is not pale.     Findings: No abrasion, erythema, petechiae or rash. Rash is not papular, urticarial or vesicular.  Neurological:     Mental Status: She is alert.  Psychiatric:  Behavior: Behavior is cooperative.      Diagnostic studies:    Spirometry: results normal (FEV1: 1.91/79%, FVC: 2.49/81%, FEV1/FVC: 77%).    Spirometry consistent with normal pattern.   Allergy Studies: none       Salvatore Marvel, MD  [facility]

## 2022-06-05 NOTE — Patient Instructions (Addendum)
1. Moderate persistent asthma, uncomplicated - Spirometry (lung testing) looks awesome today!  - We are not going to make any changes at this time.  - Daily controller medication(s): Singulair (montelukast) 33m daily - Prior to physical activity: albuterol 2 puffs 10-15 minutes before physical activity. - Rescue medications: albuterol 4 puffs every 4-6 hours as needed - Changes during respiratory infections or worsening symptoms: Add on Symbicort to 2 puffs twice daily for TWO WEEKS. - Asthma control goals:  * Full participation in all desired activities (may need albuterol before activity) * Albuterol use two time or less a week on average (not counting use with activity) * Cough interfering with sleep two time or less a month * Oral steroids no more than once a year * No hospitalizations  2. Perennial and seasonal allergic rhinitis - Continue with allergy shots at the same schedule. - Continue with fluticasone nasal spray daily to help control runny nose as needed.  - Continue with azelastine nasal spray one spray per nostril up to twice daily as needed. - Continue with your cetirizine and the montelukast.   3. Lesions on back - Add on topical clobetasol ointment twice daily and use consistently for two weeks. - Apply via a back scratcher and see how that works.  - Start prednisone 162mtwice daily for one week to see if this helps at all.  - Call usKoreaonday with an update.   4. Return in about 6 months (around 12/06/2022).    Please inform usKoreaf any Emergency Department visits, hospitalizations, or changes in symptoms. Call usKoreaefore going to the ED for breathing or allergy symptoms since we might be able to fit you in for a sick visit. Feel free to contact usKoreanytime with any questions, problems, or concerns.  It was a pleasure to see you again today!  Websites that have reliable patient information: 1. American Academy of Asthma, Allergy, and Immunology: www.aaaai.org 2. Food  Allergy Research and Education (FARE): foodallergy.org 3. Mothers of Asthmatics: http://www.asthmacommunitynetwork.org 4. American College of Allergy, Asthma, and Immunology: www.acaai.org   COVID-19 Vaccine Information can be found at: [email or web address] questions related to vaccine distribution or appointments, please email [email or web address] or call [phone]  We realize that you might be concerned about having an allergic reaction to the COVID19 vaccines. To help with that concern, WE ARE OFFERING THE COVID19 VACCINES IN OUR OFFICE! Ask the front desk for dates!     "Like" usKorean Facebook and Instagram for our latest updates!      A healthy democracy works best when ALNew York Life Insurancearticipate! Make sure you are registered to vote! If you have moved or changed any of your contact information, you will need to get this updated before voting!  In some cases, you MAY be able to register to vote online: [email or web address]

## 2022-06-09 ENCOUNTER — Telehealth: Payer: Self-pay

## 2022-06-09 NOTE — Telephone Encounter (Signed)
Left message for patient to call back  

## 2022-06-09 NOTE — Telephone Encounter (Signed)
Thornton Park, MD  Carl Best, RN  Patient overdue follow-up. Please arrange office visit with me or an APP unless she is receiving care for her IBD through another GI practice.   Thanks.   KLB

## 2022-06-10 NOTE — Telephone Encounter (Signed)
Left message for patient to call back  

## 2022-06-10 NOTE — Telephone Encounter (Signed)
Patient has been scheduled for 08/05/22.

## 2022-06-13 ENCOUNTER — Ambulatory Visit: Payer: Medicare Other | Admitting: Specialist

## 2022-06-16 ENCOUNTER — Ambulatory Visit (INDEPENDENT_AMBULATORY_CARE_PROVIDER_SITE_OTHER): Payer: Medicare Other | Admitting: Neurology

## 2022-06-16 ENCOUNTER — Encounter: Payer: Self-pay | Admitting: Neurology

## 2022-06-16 VITALS — BP 129/65 | HR 85 | Ht 62.0 in | Wt 157.0 lb

## 2022-06-16 DIAGNOSIS — R269 Unspecified abnormalities of gait and mobility: Secondary | ICD-10-CM | POA: Diagnosis not present

## 2022-06-16 DIAGNOSIS — I679 Cerebrovascular disease, unspecified: Secondary | ICD-10-CM | POA: Insufficient documentation

## 2022-06-16 DIAGNOSIS — R200 Anesthesia of skin: Secondary | ICD-10-CM

## 2022-06-16 DIAGNOSIS — M25551 Pain in right hip: Secondary | ICD-10-CM | POA: Diagnosis not present

## 2022-06-16 MED ORDER — CELECOXIB 100 MG PO CAPS: 100.0000 mg | ORAL_CAPSULE | Freq: Two times a day (BID) | ORAL | 4 refills | Status: DC
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
## 2022-06-17 ENCOUNTER — Telehealth: Payer: Self-pay | Admitting: Neurology

## 2022-06-17 NOTE — Telephone Encounter (Signed)
[employer] medicare order sent to GI, [facility] they will reach out to the patient to schedule.  

## 2022-06-18 ENCOUNTER — Ambulatory Visit (INDEPENDENT_AMBULATORY_CARE_PROVIDER_SITE_OTHER): Payer: Medicare Other

## 2022-06-18 DIAGNOSIS — J309 Allergic rhinitis, unspecified: Secondary | ICD-10-CM | POA: Diagnosis not present

## 2022-06-19 LAB — SEDIMENTATION RATE: Sed Rate: 9 mm/hr (ref 0–40)

## 2022-06-19 LAB — TSH: TSH: 0.482 u[IU]/mL (ref 0.450–4.500)

## 2022-06-19 LAB — HGB A1C W/O EAG: Hgb A1c MFr Bld: 7.2 % — ABNORMAL HIGH (ref 4.8–5.6)

## 2022-06-19 LAB — MULTIPLE MYELOMA PANEL, SERUM
Albumin SerPl Elph-Mcnc: 3.8 g/dL (ref 2.9–4.4)
Albumin/Glob SerPl: 1.4 (ref 0.7–1.7)
Alpha 1: 0.2 g/dL (ref 0.0–0.4)
Alpha2 Glob SerPl Elph-Mcnc: 0.8 g/dL (ref 0.4–1.0)
B-Globulin SerPl Elph-Mcnc: 1 g/dL (ref 0.7–1.3)
Gamma Glob SerPl Elph-Mcnc: 1 g/dL (ref 0.4–1.8)
Globulin, Total: 2.9 g/dL (ref 2.2–3.9)
IgA/Immunoglobulin A, Serum: 169 mg/dL (ref 87–352)
IgG (Immunoglobin G), Serum: 879 mg/dL (ref 586–1602)
IgM (Immunoglobulin M), Srm: 213 mg/dL (ref 26–217)
Total Protein: 6.7 g/dL (ref 6.0–8.5)

## 2022-06-19 LAB — CK: Total CK: 78 U/L (ref 32–182)

## 2022-06-19 LAB — VITAMIN B12: Vitamin B-12: 247 pg/mL (ref 232–1245)

## 2022-06-19 LAB — C-REACTIVE PROTEIN: CRP: 7 mg/L (ref 0–10)

## 2022-06-19 LAB — ANA W/REFLEX IF POSITIVE: Anti Nuclear Antibody (ANA): NEGATIVE

## 2022-06-20 ENCOUNTER — Ambulatory Visit (HOSPITAL_COMMUNITY)
Admission: RE | Admit: 2022-06-20 | Discharge: 2022-06-20 | Disposition: A | Discharge status: Home / Self Care | Payer: Medicare Other | Source: Ambulatory Visit | Attending: Neurology | Admitting: Neurology

## 2022-06-20 DIAGNOSIS — M25551 Pain in right hip: Secondary | ICD-10-CM

## 2022-06-20 DIAGNOSIS — R269 Unspecified abnormalities of gait and mobility: Secondary | ICD-10-CM | POA: Diagnosis present

## 2022-06-20 DIAGNOSIS — I679 Cerebrovascular disease, unspecified: Secondary | ICD-10-CM | POA: Diagnosis not present

## 2022-06-20 DIAGNOSIS — R200 Anesthesia of skin: Secondary | ICD-10-CM | POA: Diagnosis not present

## 2022-06-20 NOTE — Progress Notes (Signed)
Bilateral carotid duplex study completed. Please see CV Proc for preliminary results.  Jakie Debow BS, RVT 06/20/2022 1:00 PM

## 2022-06-23 ENCOUNTER — Telehealth: Payer: Self-pay | Admitting: Neurology

## 2022-06-23 NOTE — Telephone Encounter (Signed)
Please call patient, laboratory evaluation showed elevated A1c 7.2, consistent with diagnosis of diabetes, I have forwarded results to her primary care physician Sheila Reach, MD   She may contact him for management  Rest of the laboratory evaluation showed no significant abnormality

## 2022-06-24 NOTE — Telephone Encounter (Signed)
I called pt and left a vm ok per dpr updating on results of labs   Pt advised to call back with any questions.

## 2022-06-25 ENCOUNTER — Encounter: Payer: Self-pay | Admitting: Specialist

## 2022-06-25 ENCOUNTER — Ambulatory Visit (INDEPENDENT_AMBULATORY_CARE_PROVIDER_SITE_OTHER): Payer: Medicare Other | Admitting: Specialist

## 2022-06-25 VITALS — BP 153/92 | HR 78 | Ht 62.0 in | Wt 158.0 lb

## 2022-06-25 DIAGNOSIS — Z981 Arthrodesis status: Secondary | ICD-10-CM | POA: Diagnosis not present

## 2022-06-25 DIAGNOSIS — M5136 Other intervertebral disc degeneration, lumbar region: Secondary | ICD-10-CM | POA: Diagnosis not present

## 2022-06-25 DIAGNOSIS — M47812 Spondylosis without myelopathy or radiculopathy, cervical region: Secondary | ICD-10-CM

## 2022-06-25 DIAGNOSIS — M4005 Postural kyphosis, thoracolumbar region: Secondary | ICD-10-CM

## 2022-06-25 DIAGNOSIS — M4156 Other secondary scoliosis, lumbar region: Secondary | ICD-10-CM

## 2022-06-25 DIAGNOSIS — M47816 Spondylosis without myelopathy or radiculopathy, lumbar region: Secondary | ICD-10-CM
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
## 2022-06-25 NOTE — Patient Instructions (Signed)
Plan: Avoid frequent bending and stooping  No lifting greater than 10 lbs. May use ice or moist heat for pain. Weight loss is of benefit. Best medication for lumbar disc disease is arthritis medications like motrin, celebrex and naprosyn. Exercise is important to improve your indurance and does allow people to function better inspite of back pain. Lumbar MRI is ordered to assess for cause of worsening lumbar and right lumbosacral pain, pain is likely due to  Worsening degenerative disc disease, may need to consider a spinal cord stimulator vs long segment T-L fusion.

## 2022-06-26 ENCOUNTER — Telehealth: Payer: Self-pay

## 2022-06-26 ENCOUNTER — Ambulatory Visit: Payer: Medicare Other | Admitting: Allergy & Immunology

## 2022-06-26 ENCOUNTER — Ambulatory Visit
Admission: RE | Admit: 2022-06-26 | Discharge: 2022-06-26 | Disposition: A | Discharge status: Home / Self Care | Payer: Medicare Other | Source: Ambulatory Visit | Attending: Specialist | Admitting: Specialist

## 2022-06-26 ENCOUNTER — Telehealth: Payer: Self-pay | Admitting: Neurology

## 2022-06-26 ENCOUNTER — Ambulatory Visit
Admission: RE | Admit: 2022-06-26 | Discharge: 2022-06-26 | Disposition: A | Discharge status: Home / Self Care | Payer: Medicare Other | Source: Ambulatory Visit | Attending: Neurology | Admitting: Neurology

## 2022-06-26 DIAGNOSIS — M25551 Pain in right hip: Secondary | ICD-10-CM

## 2022-06-26 DIAGNOSIS — I679 Cerebrovascular disease, unspecified: Secondary | ICD-10-CM

## 2022-06-26 DIAGNOSIS — R269 Unspecified abnormalities of gait and mobility: Secondary | ICD-10-CM

## 2022-06-26 DIAGNOSIS — M4156 Other secondary scoliosis, lumbar region: Secondary | ICD-10-CM

## 2022-06-26 DIAGNOSIS — R200 Anesthesia of skin: Secondary | ICD-10-CM

## 2022-06-26 DIAGNOSIS — M47812 Spondylosis without myelopathy or radiculopathy, cervical region: Secondary | ICD-10-CM

## 2022-06-26 DIAGNOSIS — M47816 Spondylosis without myelopathy or radiculopathy, lumbar region: Secondary | ICD-10-CM

## 2022-06-26 DIAGNOSIS — M5136 Other intervertebral disc degeneration, lumbar region: Secondary | ICD-10-CM

## 2022-06-26 NOTE — Telephone Encounter (Signed)
I spoke with the patient and informed her of the ultrasound results. She is agreeable to continuing on the current treatment. She verbalized understanding of the findings and expressed appreciation for the call.

## 2022-06-26 NOTE — Telephone Encounter (Signed)
Please call patient MRI of the brain showed chronic lacunar infarction in the left cerebellum, no change compared to previous MRI in December 2020  Evidence of mild small vessel disease  There was no acute abnormalities   IMPRESSION: This MRI of the brain without contrast shows the following: Chronic lacunar infarction in the left cerebellar hemisphere, unchanged compared to the 10/10/2019 MRI. Scattered T2/FLAIR hyperintense foci in the hemispheres and pons in a pattern most consistent with chronic microvascular ischemic change, essentially unchanged compared to the 2020 MRI. No acute findings.

## 2022-06-26 NOTE — Telephone Encounter (Signed)
-----   Message from Marcial Pacas, MD sent at 06/24/2022  5:52 PM EDT ----- Please call patient,  Ultrasound of carotid artery showed left carotid artery less than 39% stenosis, right carotid artery near normal.  Antegrade flow of bilateral vertebral artery.  Keep current medication management

## 2022-06-27 ENCOUNTER — Ambulatory Visit: Admit: 2022-06-27 | Payer: Medicare Other | Admitting: Orthopaedic Surgery

## 2022-06-27 SURGERY — ARTHROPLASTY, HIP, TOTAL, ANTERIOR APPROACH
Anesthesia: Choice | Site: Hip | Laterality: Right

## 2022-06-30 NOTE — Telephone Encounter (Signed)
I called pt. No answer, left a message asking pt to call me back.   

## 2022-07-01 NOTE — Telephone Encounter (Signed)
Is returning a call to nurse. Pt is requesting nurse call her back.

## 2022-07-02 NOTE — Telephone Encounter (Signed)
Pt is asking for a call back from RN regarding message from 08-28

## 2022-07-02 NOTE — Telephone Encounter (Signed)
Pt verified by name and DOB, results given per provider, pt voiced understanding all question answered. 

## 2022-07-10 ENCOUNTER — Other Ambulatory Visit: Payer: Self-pay | Admitting: *Deleted

## 2022-07-10 ENCOUNTER — Telehealth: Payer: Self-pay | Admitting: Allergy & Immunology

## 2022-07-10 ENCOUNTER — Encounter: Payer: Medicare Other | Admitting: Orthopaedic Surgery

## 2022-07-10 MED ORDER — MONTELUKAST SODIUM 10 MG PO TABS: 10.0000 mg | ORAL_TABLET | Freq: Every day | ORAL | 1 refills | Status: DC

## 2022-07-10 MED ORDER — AZELASTINE HCL 0.1 % NA SOLN: NASAL | 5 refills | Status: DC

## 2022-07-10 MED ORDER — FLUTICASONE PROPIONATE 50 MCG/ACT NA SUSP: 2.0000 | Freq: Every day | NASAL | 1 refills | Status: DC

## 2022-07-10 NOTE — Telephone Encounter (Signed)
pat

## 2022-07-14 ENCOUNTER — Ambulatory Visit (INDEPENDENT_AMBULATORY_CARE_PROVIDER_SITE_OTHER): Payer: Medicare Other

## 2022-07-14 DIAGNOSIS — J309 Allergic rhinitis, unspecified: Secondary | ICD-10-CM

## 2022-07-17 ENCOUNTER — Ambulatory Visit (INDEPENDENT_AMBULATORY_CARE_PROVIDER_SITE_OTHER): Payer: Medicare Other | Admitting: Specialist

## 2022-07-17 ENCOUNTER — Encounter: Payer: Self-pay | Admitting: Specialist

## 2022-07-17 VITALS — BP 134/83 | HR 75 | Ht 62.0 in | Wt 158.0 lb

## 2022-07-17 DIAGNOSIS — M51369 Other intervertebral disc degeneration, lumbar region without mention of lumbar back pain or lower extremity pain: Secondary | ICD-10-CM

## 2022-07-17 DIAGNOSIS — R202 Paresthesia of skin: Secondary | ICD-10-CM

## 2022-07-17 DIAGNOSIS — M4005 Postural kyphosis, thoracolumbar region: Secondary | ICD-10-CM

## 2022-07-17 DIAGNOSIS — M5136 Other intervertebral disc degeneration, lumbar region: Secondary | ICD-10-CM

## 2022-07-17 DIAGNOSIS — M47816 Spondylosis without myelopathy or radiculopathy, lumbar region: Secondary | ICD-10-CM

## 2022-07-17 DIAGNOSIS — R200 Anesthesia of skin: Secondary | ICD-10-CM

## 2022-07-17 DIAGNOSIS — M4156 Other secondary scoliosis, lumbar region: Secondary | ICD-10-CM

## 2022-07-17 DIAGNOSIS — M25551 Pain in right hip: Secondary | ICD-10-CM

## 2022-07-17 DIAGNOSIS — M1611 Unilateral primary osteoarthritis, right hip: Secondary | ICD-10-CM

## 2022-07-17 NOTE — Patient Instructions (Signed)
  Plan: Avoid frequent bending and stooping  No lifting greater than 10 lbs. May use ice or moist heat for pain. Weight loss is of benefit. Best medication for lumbar disc disease is arthritis medications like motrin, celebrex and naprosyn. Exercise is important to improve your indurance and does allow people to function better inspite of back pain. Lumbar MRI is ordered to assess for cause of worsening lumbar and right lumbosacral pain, pain is likely due to  Worsening degenerative disc disease, may need to consider a spinal cord stimulator vs long segment T-L fusion.   I recommend that you have the EMG/NCV of the legs that are ordered. Contact Dr. Kassie Mends office to see him and be treated for new diagnosed diabetes that needs to be treated.
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
## 2022-07-29 ENCOUNTER — Other Ambulatory Visit: Payer: Self-pay | Admitting: Orthopaedic Surgery

## 2022-07-29 NOTE — Patient Instructions (Signed)
Avoid frequent bending and stooping  No lifting greater than 10 lbs. May use ice or moist heat for pain. Weight loss is of benefit. Best medication for lumbar disc disease is arthritis medications like motrin, celebrex and naprosyn. Exercise is important to improve your indurance and does allow people to function better inspite of back pain.

## 2022-07-30 ENCOUNTER — Encounter: Payer: Medicare Other | Admitting: Neurology

## 2022-08-01 ENCOUNTER — Ambulatory Visit: Payer: Medicare Other | Admitting: Cardiology

## 2022-08-01 ENCOUNTER — Ambulatory Visit: Payer: Medicare Other | Admitting: Neurology

## 2022-08-01 ENCOUNTER — Encounter: Payer: Self-pay | Admitting: Neurology

## 2022-08-01 ENCOUNTER — Ambulatory Visit (INDEPENDENT_AMBULATORY_CARE_PROVIDER_SITE_OTHER): Payer: Medicare Other | Admitting: Neurology

## 2022-08-01 VITALS — BP 139/79 | HR 75 | Ht 64.0 in | Wt 152.0 lb

## 2022-08-01 DIAGNOSIS — R2689 Other abnormalities of gait and mobility: Secondary | ICD-10-CM

## 2022-08-01 DIAGNOSIS — R200 Anesthesia of skin: Secondary | ICD-10-CM

## 2022-08-01 DIAGNOSIS — M25551 Pain in right hip: Secondary | ICD-10-CM

## 2022-08-01 DIAGNOSIS — I679 Cerebrovascular disease, unspecified: Secondary | ICD-10-CM

## 2022-08-01 DIAGNOSIS — R269 Unspecified abnormalities of gait and mobility: Secondary | ICD-10-CM

## 2022-08-01 NOTE — Procedures (Signed)
Full Name: Sheila Vincent Gender: Female MRN #: [id number] Date of Birth: 08/30/1961    Visit Date: 08/01/2022 08:46 Age: 61 Years Referring Physician: Marcial Pacas Height: 5 feet 4 inch History: 61 year old female presenting with worsening right hip pain, low back pain, intermittent right median first toe numbness  Summary of the test: Nerve conduction study: Right sural, peroneal sensory response showed good snap amplitude, mild variations in peak latency could due to cold limb temperature, and optimal stimulation site,  Right peroneal to EDB motor response showed mildly decreased CMAP amplitude, right tibial motor responses were within normal limits  Right median sensory and motor response showed moderately prolonged latency, were otherwise normal.  Right ulnar sensory responses were within normal limit  Electromyography: Selected needle examinations of right lower extremity and right lumbosacral paraspinal muscles were normal.  Conclusion: This is an abnormal study.  There is no evidence of large fiber peripheral neuropathy or active right lumbosacral radiculopathy.  There is evidence of moderate right median neuropathy across the wrist consistent with right carpal tunnel syndrome.    ------------------------------- Catalina Pizza.D.Ph.D  [facility] [address], [place], [place] [address] Tel: [phone] Fax: [phone]  Verbal informed consent was obtained from the patient, patient was informed of potential risk of procedure, including bruising, bleeding, hematoma formation, infection, muscle weakness, muscle pain, numbness, among others.        [place]    Nerve / Sites Muscle Latency Ref. Amplitude Ref. Rel Amp Segments Distance Velocity Ref. Area    ms ms mV mV %  cm m/s m/s mVms  R Median - APB     Wrist APB 5.1 ?4.4 5.5 ?4.0 100 Wrist - APB 7   19.3     Upper arm APB 8.6  4.7  85.7 Upper arm - Wrist 18 52 ?49 17.5  R Peroneal -  EDB     Ankle EDB 6.0 ?6.5 2.0 ?2.0 100 Ankle - EDB 9   9.5     Fib head EDB 10.9  1.7  84.7 Fib head - Ankle 12 25 ?44 8.9     Pop fossa EDB 15.0  1.2  68.5 Pop fossa - Fib head 21 51 ?44 5.9         Pop fossa - Ankle      R Tibial - AH     Ankle AH 3.8 ?5.8 12.4 ?4.0 100 Ankle - AH 9   34.0     Pop fossa AH 12.8  10.4  84.1 Pop fossa - Ankle 37 41 ?41 30.7           SNC    Nerve / Sites Rec. Site Peak Lat Ref.  Amp Ref. Segments Distance    ms ms V V  cm  R Sural - Ankle (Calf)     Calf Ankle 4.6 ?4.4 8 ?6 Calf - Ankle 14  R Superficial peroneal - Ankle     Lat leg Ankle 2.5 ?4.4 10 ?6 Lat leg - Ankle 7  R Median - Orthodromic (Dig II, Mid palm)     Dig II Wrist 4.2 ?3.4 11 ?10 Dig II - Wrist 13  R Ulnar - Orthodromic, (Dig V, Mid palm)     Dig V Wrist 2.9 ?3.1 11 ?5 Dig V - Wrist 71             F  Wave    Nerve F Lat Ref.   ms ms  R Tibial - AH 54.1 ?56.0       EMG Summary Table    Spontaneous MUAP Recruitment  Muscle IA Fib PSW Fasc Other Amp Dur. Poly Pattern  R. Tibialis anterior Normal None None None _______ Normal Normal Normal Normal  R. Tibialis posterior Normal None None None _______ Normal Normal Normal Normal  R. Peroneus longus Normal None None None _______ Normal Normal Normal Normal  R. Gastrocnemius (Medial head) Normal None None None _______ Normal Normal Normal Normal  R. Vastus lateralis Normal None None None _______ Normal Normal Normal Normal  R. Lumbar paraspinals (low) Normal None None None _______ Normal Normal Normal Normal  R. Lumbar paraspinals (mid) Normal None None None _______ Normal Normal Normal Normal
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
## 2022-08-05 ENCOUNTER — Ambulatory Visit (INDEPENDENT_AMBULATORY_CARE_PROVIDER_SITE_OTHER): Payer: Medicare Other | Admitting: Gastroenterology

## 2022-08-05 ENCOUNTER — Encounter: Payer: Self-pay | Admitting: Gastroenterology

## 2022-08-05 ENCOUNTER — Other Ambulatory Visit: Payer: Medicare Other

## 2022-08-05 VITALS — BP 130/90 | HR 84

## 2022-08-05 DIAGNOSIS — R197 Diarrhea, unspecified: Secondary | ICD-10-CM | POA: Diagnosis not present

## 2022-08-05 DIAGNOSIS — K529 Noninfective gastroenteritis and colitis, unspecified: Secondary | ICD-10-CM

## 2022-08-05 MED ORDER — MESALAMINE 1.2 G PO TBEC: 4.8000 g | DELAYED_RELEASE_TABLET | Freq: Every day | ORAL | 5 refills | Status: DC
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
## 2022-08-05 NOTE — Patient Instructions (Addendum)
I did not change your medications today.  I recommended a stool test today. If the test is normal, we will want to discuss alternatives to metformin with your prescribing doctor.   Consider adding Metamucil or Benefiber to your stools every day.   I recommend that you use sunscreen and sun-protective clothing whenever you are outside.  Keeping your bones healthy is important. I recommend that you take daily calcium and Vitamin D supplements. Weight bearing exercise is also good for your bones.   Please avoid all non-steroidal anti-inflammatory medications.   I recommend that you go to the [place] website for additional information about achieving good health.    Please follow-up with your primary care provider to arrange for GYN follow-up and mammogram.   Your provider has requested that you go to the basement level for lab work before leaving today. Press "B" on the elevator. The lab is located at the first door on the left as you exit the elevator.

## 2022-08-06 ENCOUNTER — Other Ambulatory Visit: Payer: Self-pay | Admitting: Specialist

## 2022-08-07 ENCOUNTER — Ambulatory Visit (INDEPENDENT_AMBULATORY_CARE_PROVIDER_SITE_OTHER): Payer: Medicare Other | Admitting: Specialist

## 2022-08-07 ENCOUNTER — Ambulatory Visit: Payer: Self-pay

## 2022-08-07 ENCOUNTER — Encounter: Payer: Self-pay | Admitting: Specialist

## 2022-08-07 VITALS — BP 139/88 | HR 45 | Ht 62.0 in | Wt 152.0 lb

## 2022-08-07 DIAGNOSIS — R202 Paresthesia of skin: Secondary | ICD-10-CM

## 2022-08-07 DIAGNOSIS — M5136 Other intervertebral disc degeneration, lumbar region: Secondary | ICD-10-CM | POA: Diagnosis not present

## 2022-08-07 DIAGNOSIS — M1611 Unilateral primary osteoarthritis, right hip: Secondary | ICD-10-CM

## 2022-08-07 DIAGNOSIS — M4156 Other secondary scoliosis, lumbar region: Secondary | ICD-10-CM

## 2022-08-07 DIAGNOSIS — R200 Anesthesia of skin: Secondary | ICD-10-CM

## 2022-08-07 DIAGNOSIS — M51369 Other intervertebral disc degeneration, lumbar region without mention of lumbar back pain or lower extremity pain: Secondary | ICD-10-CM

## 2022-08-07 DIAGNOSIS — M4005 Postural kyphosis, thoracolumbar region: Secondary | ICD-10-CM

## 2022-08-07 DIAGNOSIS — M47816 Spondylosis without myelopathy or radiculopathy, lumbar region: Secondary | ICD-10-CM

## 2022-08-07 DIAGNOSIS — G5601 Carpal tunnel syndrome, right upper limb: Secondary | ICD-10-CM

## 2022-08-07 MED ORDER — PREGABALIN 75 MG PO CAPS: 75.0000 mg | ORAL_CAPSULE | Freq: Two times a day (BID) | ORAL | 0 refills | Status: DC

## 2022-08-07 NOTE — Patient Instructions (Signed)
Carpal Tunnel Syndrome  Carpal tunnel syndrome is a condition that causes pain in your hand and arm. The carpal tunnel is a narrow area located on the palm side of your wrist. Repeated wrist motion or certain diseases may cause swelling within the tunnel. This swelling pinches the main nerve in the wrist (median nerve). What are the causes? This condition may be caused by: Repeated wrist motions. Wrist injuries. Arthritis. A cyst or tumor in the carpal tunnel. Fluid buildup during pregnancy. Sometimes the cause of this condition is not known. What increases the risk? This condition is more likely to develop in: People who have jobs that cause them to repeatedly move their wrists in the same motion, such as Art gallery manager. Women. People with certain conditions, such as: Diabetes. Obesity. An underactive thyroid (hypothyroidism). Kidney failure. What are the signs or symptoms? Symptoms of this condition include: A tingling feeling in your fingers, especially in your thumb, index, and middle fingers. Tingling or numbness in your hand. An aching feeling in your entire arm, especially when your wrist and elbow are bent for long periods of time. Wrist pain that goes up your arm to your shoulder. Pain that goes down into your palm or fingers. A weak feeling in your hands. You may have trouble grabbing and holding items. Your symptoms may feel worse during the night. How is this diagnosed? This condition is diagnosed with a medical history and physical exam. You may also have tests, including: An electromyogram (EMG). This test measures electrical signals sent by your nerves into the muscles. X-rays. How is this treated? Treatment for this condition includes: Lifestyle changes. It is important to stop doing or modify the activity that caused your condition. Physical or occupational therapy. Medicines for pain and inflammation. This may include medicine that is injected into your  wrist. A wrist splint. Surgery. Follow these instructions at home: If you have a splint:  Wear it as told by your health care provider. Remove it only as told by your health care provider. Loosen the splint if your fingers become numb and tingle, or if they turn cold and blue. Keep the splint clean and dry. General instructions  Take over-the-counter and prescription medicines only as told by your health care provider. Rest your wrist from any activity that may be causing your pain. If your condition is work related, talk to your employer about changes that can be made, such as getting a wrist pad to use while typing. If directed, apply ice to the painful area: Put ice in a plastic bag. Place a towel between your skin and the bag. Leave the ice on for 20 minutes, 2-3 times per day. Keep all follow-up visits as told by your health care provider. This is important. Do any exercises as told by your health care provider, physical therapist, or occupational therapist. Contact a health care provider if: You have new symptoms. Your pain is not controlled with medicines. Your symptoms get worse. This information is not intended to replace advice given to you by your health care provider. Make sure you discuss any questions you have with your health care provider. Document Released: 10/17/2000 Document Revised: 02/28/2016 Document Reviewed: 07/01/2017 Elsevier Interactive Patient Education  2017 [employer].  Gradually exchange Lyrica for evening dose of gabapentin for 4-5 days then exchange for both Evening and morning dce. Document Released: 10/17/2000

## 2022-08-07 NOTE — Addendum Note (Signed)
Addended by: Basil Dess on: 08/07/2022 03:13 PM   Modules accepted: Orders
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
## 2022-08-08 ENCOUNTER — Other Ambulatory Visit: Payer: Medicare Other

## 2022-08-08 ENCOUNTER — Ambulatory Visit (INDEPENDENT_AMBULATORY_CARE_PROVIDER_SITE_OTHER): Payer: Medicare Other

## 2022-08-08 DIAGNOSIS — R197 Diarrhea, unspecified: Secondary | ICD-10-CM

## 2022-08-08 DIAGNOSIS — J309 Allergic rhinitis, unspecified: Secondary | ICD-10-CM | POA: Diagnosis not present

## 2022-08-08 DIAGNOSIS — K529 Noninfective gastroenteritis and colitis, unspecified: Secondary | ICD-10-CM

## 2022-08-12 LAB — CALPROTECTIN, FECAL: Calprotectin, Fecal: 316 ug/g — ABNORMAL HIGH (ref 0–120)

## 2022-08-14 ENCOUNTER — Ambulatory Visit: Payer: Medicare Other | Admitting: Cardiology

## 2022-08-14 ENCOUNTER — Encounter: Payer: Self-pay | Admitting: Cardiology

## 2022-08-14 VITALS — BP 138/77 | HR 86 | Temp 96.7°F | Resp 16 | Ht 62.0 in | Wt 151.0 lb

## 2022-08-14 DIAGNOSIS — E781 Pure hyperglyceridemia: Secondary | ICD-10-CM

## 2022-08-14 DIAGNOSIS — I10 Essential (primary) hypertension: Secondary | ICD-10-CM

## 2022-08-14 DIAGNOSIS — E7800 Pure hypercholesterolemia, unspecified: Secondary | ICD-10-CM

## 2022-08-14 DIAGNOSIS — J3089 Other allergic rhinitis: Secondary | ICD-10-CM

## 2022-08-14 DIAGNOSIS — R072 Precordial pain: Secondary | ICD-10-CM
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
## 2022-08-14 NOTE — Progress Notes (Signed)
VIALS EXP 08-15-23

## 2022-08-15 DIAGNOSIS — J302 Other seasonal allergic rhinitis: Secondary | ICD-10-CM

## 2022-08-20 ENCOUNTER — Encounter: Payer: Self-pay | Admitting: Cardiology

## 2022-08-23 LAB — CMP14+EGFR
ALT: 17 IU/L (ref 0–32)
AST: 17 IU/L (ref 0–40)
Albumin/Globulin Ratio: 2 (ref 1.2–2.2)
Albumin: 4.6 g/dL (ref 3.9–4.9)
Alkaline Phosphatase: 146 IU/L — ABNORMAL HIGH (ref 44–121)
BUN/Creatinine Ratio: 19 (ref 12–28)
BUN: 12 mg/dL (ref 8–27)
Bilirubin Total: 0.4 mg/dL (ref 0.0–1.2)
CO2: 23 mmol/L (ref 20–29)
Calcium: 9.4 mg/dL (ref 8.7–10.3)
Chloride: 102 mmol/L (ref 96–106)
Creatinine, Ser: 0.64 mg/dL (ref 0.57–1.00)
Globulin, Total: 2.3 g/dL (ref 1.5–4.5)
Glucose: 86 mg/dL (ref 70–99)
Potassium: 4.5 mmol/L (ref 3.5–5.2)
Sodium: 140 mmol/L (ref 134–144)
Total Protein: 6.9 g/dL (ref 6.0–8.5)
eGFR: 99 mL/min/{1.73_m2} (ref >59)

## 2022-08-23 LAB — LIPID PANEL WITH LDL/HDL RATIO
Cholesterol, Total: 139 mg/dL (ref 100–199)
HDL: 34 mg/dL — ABNORMAL LOW (ref >39)
LDL Chol Calc (NIH): 62 mg/dL (ref 0–99)
LDL/HDL Ratio: 1.8 ratio (ref 0.0–3.2)
Triglycerides: 273 mg/dL — ABNORMAL HIGH (ref 0–149)
VLDL Cholesterol Cal: 43 mg/dL — ABNORMAL HIGH (ref 5–40)

## 2022-08-23 LAB — LDL CHOLESTEROL, DIRECT: LDL Direct: 63 mg/dL (ref 0–99)

## 2022-08-25 ENCOUNTER — Ambulatory Visit (INDEPENDENT_AMBULATORY_CARE_PROVIDER_SITE_OTHER): Payer: Medicare Other | Admitting: Specialist

## 2022-08-25 ENCOUNTER — Telehealth: Payer: Self-pay | Admitting: Specialist

## 2022-08-25 ENCOUNTER — Encounter: Payer: Self-pay | Admitting: Specialist

## 2022-08-25 VITALS — BP 162/76 | HR 91 | Ht 62.0 in | Wt 152.0 lb

## 2022-08-25 DIAGNOSIS — R200 Anesthesia of skin: Secondary | ICD-10-CM

## 2022-08-25 DIAGNOSIS — M47816 Spondylosis without myelopathy or radiculopathy, lumbar region: Secondary | ICD-10-CM

## 2022-08-25 DIAGNOSIS — M5136 Other intervertebral disc degeneration, lumbar region: Secondary | ICD-10-CM

## 2022-08-25 DIAGNOSIS — Z981 Arthrodesis status: Secondary | ICD-10-CM | POA: Diagnosis not present

## 2022-08-25 DIAGNOSIS — R202 Paresthesia of skin: Secondary | ICD-10-CM

## 2022-08-25 DIAGNOSIS — G5601 Carpal tunnel syndrome, right upper limb: Secondary | ICD-10-CM | POA: Diagnosis not present

## 2022-08-25 DIAGNOSIS — M25551 Pain in right hip: Secondary | ICD-10-CM

## 2022-08-25 DIAGNOSIS — M4156 Other secondary scoliosis, lumbar region: Secondary | ICD-10-CM

## 2022-08-25 DIAGNOSIS — M1611 Unilateral primary osteoarthritis, right hip: Secondary | ICD-10-CM

## 2022-08-25 MED ORDER — ETODOLAC 400 MG PO TABS: 400.0000 mg | ORAL_TABLET | Freq: Two times a day (BID) | ORAL | 3 refills | Status: DC

## 2022-08-25 NOTE — Patient Instructions (Signed)
Plan: Avoid bending, stooping and avoid lifting weights greater than 10 lbs. Avoid prolong standing and walking. Avoid frequent bending and stooping  No lifting greater than 10 lbs. May use ice or moist heat for pain. Weight loss is of benefit. Handicap license is approved. May wish to consider an epidulral steriod injection. If you decide you would want to try the injection call and we would scedule Dr. Romona Curls secretary/Assistant will call to arrange for epidural steroid injection
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
## 2022-08-25 NOTE — Addendum Note (Signed)
Addended by: Basil Dess on: 08/25/2022 03:02 PM   Modules accepted: Orders

## 2022-08-25 NOTE — Telephone Encounter (Signed)
Patient would like to know if Dr. Louanne Skye will be writing a RX for inserts? Her call back number is [phone]

## 2022-08-25 NOTE — Telephone Encounter (Signed)
Printed and gave to [facility]

## 2022-08-29 ENCOUNTER — Ambulatory Visit (INDEPENDENT_AMBULATORY_CARE_PROVIDER_SITE_OTHER): Payer: Medicare Other | Admitting: Sports Medicine

## 2022-08-29 ENCOUNTER — Ambulatory Visit: Payer: Self-pay

## 2022-08-29 DIAGNOSIS — M25551 Pain in right hip: Secondary | ICD-10-CM

## 2022-08-29 MED ORDER — LIDOCAINE HCL 1 % IJ SOLN
4.0000 mL | INTRAMUSCULAR | Status: AC | PRN
  Administered 2022-08-29: 4 mL

## 2022-08-29 MED ORDER — METHYLPREDNISOLONE ACETATE 40 MG/ML IJ SUSP
40.0000 mg | INTRAMUSCULAR | Status: AC | PRN
  Administered 2022-08-29: 40 mg via INTRA_ARTICULAR

## 2022-08-29 NOTE — Progress Notes (Signed)
   Procedure Note  Patient: Sheila Vincent             Date of Birth: 01/11/1961           MRN: [id number]             Visit Date: 08/29/2022  Procedures: Visit Diagnoses:  1. Pain in right hip    Large Joint Inj: R hip joint on 08/29/2022 1:25 PM Indications: pain Details: 22 G 3.5 in needle, ultrasound-guided anterior approach Medications: 4 mL lidocaine 1 %; 40 mg methylPREDNISolone acetate 40 MG/ML Outcome: tolerated well, no immediate complications  Procedure: US-guided intra-articular hip injection, right After discussion on risks/benefits/indications and informed verbal consent was obtained, a timeout was performed. Patient was lying supine on exam table. The hip was cleaned with betadine and alcohol swabs. Then utilizing ultrasound guidance, the patient's femoral head and neck junction was identified and subsequently injected with 4:1 lidocaine:depomedrol via an in-plane approach with ultrasound visualization of the injectate administered into the hip joint. Patient tolerated procedure well without immediate complications.  Procedure, treatment alternatives, risks and benefits explained, specific risks discussed. Consent was given by the patient. Immediately prior to procedure a time out was called to verify the correct patient, procedure, equipment, support staff and site/side marked as required. Patient was prepped and draped in the usual sterile fashion.     - I evaluated the patient about 10 minutes post-injection and she had improvement in pain and range of motion - follow-up with Dr. Louanne Skye or Ninfa Linden as indicated; I am happy to see them as needed - Did want to ask Dr. Louanne Skye about shoe inserts - will message him  Elba Barman, DO [place]  This note was dictated using Dragon naturally speaking software and may contain errors in syntax, spelling, or content which have not been identified prior to signing  this note.

## 2022-08-31 ENCOUNTER — Other Ambulatory Visit: Payer: Self-pay | Admitting: Cardiology

## 2022-08-31 DIAGNOSIS — E781 Pure hyperglyceridemia: Secondary | ICD-10-CM

## 2022-08-31 DIAGNOSIS — E7800 Pure hypercholesterolemia, unspecified: Secondary | ICD-10-CM

## 2022-08-31 MED ORDER — FENOFIBRATE 145 MG PO TABS: 145.0000 mg | ORAL_TABLET | Freq: Every day | ORAL | 0 refills | Status: DC

## 2022-08-31 MED ORDER — ATORVASTATIN CALCIUM 20 MG PO TABS: 20.0000 mg | ORAL_TABLET | Freq: Every day | ORAL | 0 refills | Status: DC

## 2022-08-31 NOTE — Progress Notes (Signed)
Spoke to the patient over the phone.  LDL levels are well controlled.  Decrease atorvastatin to 20 mg p.o. nightly  Triglyceride levels are not well controlled but better compared to the past.   Patient states that she is implemented as much lifestyle changes as possible and is willing to start fenofibrate.  Labs in 6 weeks to reevaluate lipids, specifically LDL and triglycerides  Follow-up in 3 months  Of note, her alkaline phosphatase levels are slightly above normal limits.  I have asked her to discuss this further with PCP. Please send a copy of the labs to PCP for reference.  I have made the medication changes as discussed above and placed labs.  But have the front desk reach out to her for 29-monthfollow-up visit  Josef Tourigny TVidor DO, [facility]

## 2022-08-31 NOTE — Progress Notes (Signed)
Result notes for labs 08/22/2022  Spoke to the patient over the phone.   LDL levels are well controlled.  Decrease atorvastatin to 20 mg p.o. nightly   Triglyceride levels are not well controlled but better compared to the past.    Patient states that she is implemented as much lifestyle changes as possible and is willing to start fenofibrate.   Labs in 6 weeks to reevaluate lipids, specifically LDL and triglycerides   Follow-up in 3 months   Of note, her alkaline phosphatase levels are slightly above normal limits.  I have asked her to discuss this further with PCP. Please send a copy of the labs to PCP for reference.   I have made the medication changes as discussed above and placed labs.   But have the front desk reach out to her for 71-monthfollow-up visit.    ICD-10-CM   1. Pure hypercholesterolemia  E78.00 atorvastatin (LIPITOR) 20 MG tablet    Lipid Panel With LDL/HDL Ratio    LDL cholesterol, direct    CMP14+EGFR    2. Hypertriglyceridemia  E78.1 fenofibrate (TRICOR) 145 MG tablet    Lipid Panel With LDL/HDL Ratio    LDL cholesterol, direct    CMP14+EGFR     Telephone encounter: 7 minutes.   Eriel Doyon THandley DO, [facility]

## 2022-09-03 ENCOUNTER — Telehealth: Payer: Self-pay | Admitting: Orthopaedic Surgery

## 2022-09-03 NOTE — Progress Notes (Signed)
Please reach out to her for 2-monthfollow-up visit, per ST.

## 2022-09-05 ENCOUNTER — Ambulatory Visit (INDEPENDENT_AMBULATORY_CARE_PROVIDER_SITE_OTHER): Payer: Medicare Other

## 2022-09-05 DIAGNOSIS — J309 Allergic rhinitis, unspecified: Secondary | ICD-10-CM

## 2022-09-08 ENCOUNTER — Encounter: Payer: Self-pay | Admitting: Family Medicine

## 2022-09-08 ENCOUNTER — Ambulatory Visit (INDEPENDENT_AMBULATORY_CARE_PROVIDER_SITE_OTHER): Payer: Medicare Other | Admitting: Family Medicine

## 2022-09-08 VITALS — BP 116/62 | HR 100 | Temp 98.4°F | Resp 16 | Wt 148.2 lb

## 2022-09-08 DIAGNOSIS — J019 Acute sinusitis, unspecified: Secondary | ICD-10-CM

## 2022-09-08 DIAGNOSIS — B9689 Other specified bacterial agents as the cause of diseases classified elsewhere: Secondary | ICD-10-CM

## 2022-09-08 DIAGNOSIS — J4540 Moderate persistent asthma, uncomplicated: Secondary | ICD-10-CM

## 2022-09-08 DIAGNOSIS — Z9189 Other specified personal risk factors, not elsewhere classified: Secondary | ICD-10-CM

## 2022-09-08 DIAGNOSIS — J302 Other seasonal allergic rhinitis: Secondary | ICD-10-CM

## 2022-09-08 DIAGNOSIS — J3089 Other allergic rhinitis: Secondary | ICD-10-CM

## 2022-09-08 DIAGNOSIS — R011 Cardiac murmur, unspecified: Secondary | ICD-10-CM

## 2022-09-08 MED ORDER — AMOXICILLIN-POT CLAVULANATE 875-125 MG PO TABS: 1.0000 | ORAL_TABLET | Freq: Two times a day (BID) | ORAL | 0 refills | Status: AC

## 2022-09-08 NOTE — Patient Instructions (Signed)
Acute sinusitis Begin Augmentin 875 mg twice a day for sinus infection  Asthma Continue montelukast 10 mg once a day to prevent cough or wheeze Continue albuterol 2 puffs once every 4 hours as needed for cough or wheeze You may use albuterol 2 puffs 5 to 15 minutes before activity to decrease cough or wheeze For asthma flare, begin Symbicort 160-2 puffs twice a day for 2 weeks or until cough and wheeze free  Allergic rhinitis Continue allergen avoidance measures directed toward grass pollen, weed pollen, dust mite, cat, dog, and mold as listed below Continue allergen immunotherapy and have access to an epinephrine autoinjector set. Do not get your injection until you are feeling well Continue azelastine 2 sprays in each nostril twice a day as needed for nasal issues Continue cetirizine 10 mg once a day as needed for runny nose or itch Continue Flonase 2 sprays in each nostril once a day as needed for a stuffy nose Consider saline nasal rinses as needed for nasal symptoms. Use this before any medicated nasal sprays for best result For thick post nasal drainage, begin Mucinex [phone] mg twice a day and increase your fluid intake as tolerated  COVID Your COVID test was negative at today's visit.  Continue to mask and isolate if you are feeling symptoms  Call the clinic if this treatment plan is not working well for you.  Follow up in 2 months or sooner if needed.  Reducing Pollen Exposure The American Academy of Allergy, Asthma and Immunology suggests the following steps to reduce your exposure to pollen during allergy seasons. Do not hang sheets or clothing out to dry; pollen may collect on these items. Do not mow lawns or spend time around freshly cut grass; mowing stirs up pollen. Keep windows closed at night.  Keep car windows closed while driving. Minimize morning activities outdoors, a time when pollen counts are usually at their highest. Stay indoors as much as possible when  pollen counts or humidity is high and on windy days when pollen tends to remain in the air longer. Use air conditioning when possible.  Many air conditioners have filters that trap the pollen spores. Use a HEPA room air filter to remove pollen form the indoor air you breathe.  Control of Dog or Cat Allergen Avoidance is the best way to manage a dog or cat allergy. If you have a dog or cat and are allergic to dog or cats, consider removing the dog or cat from the home. If you have a dog or cat but don't want to find it a new home, or if your family wants a pet even though someone in the household is allergic, here are some strategies that may help keep symptoms at bay:  Keep the pet out of your bedroom and restrict it to only a few rooms. Be advised that keeping the dog or cat in only one room will not limit the allergens to that room. Don't pet, hug or kiss the dog or cat; if you do, wash your hands with soap and water. High-efficiency particulate air (HEPA) cleaners run continuously in a bedroom or living room can reduce allergen levels over time. Regular use of a high-efficiency vacuum cleaner or a central vacuum can reduce allergen levels. Giving your dog or cat a bath at least once a week can reduce airborne allergen.  Control of Mold Allergen Mold and fungi can grow on a variety of surfaces provided certain temperature and moisture conditions exist.  Outdoor molds  grow on plants, decaying vegetation and soil.  The major outdoor mold, Alternaria and Cladosporium, are found in very high numbers during hot and dry conditions.  Generally, a late Summer - Fall peak is seen for common outdoor fungal spores.  Rain will temporarily lower outdoor mold spore count, but counts rise rapidly when the rainy period ends.  The most important indoor molds are Aspergillus and Penicillium.  Dark, humid and poorly ventilated basements are ideal sites for mold growth.  The next most common sites of mold growth are  the bathroom and the kitchen.  Outdoor [employer] Use air conditioning and keep windows closed Avoid exposure to decaying vegetation. Avoid leaf raking. Avoid grain handling. Consider wearing a face mask if working in moldy areas.  Indoor Mold Control Maintain humidity below 50%. Clean washable surfaces with 5% bleach solution. Remove sources e.g. Contaminated carpets.   Control of Dust Mite Allergen Dust mites play a major role in allergic asthma and rhinitis. They occur in environments with high humidity wherever human skin is found. Dust mites absorb humidity from the atmosphere (ie, they do not drink) and feed on organic matter (including shed human and animal skin). Dust mites are a microscopic type of insect that you cannot see with the naked eye. High levels of dust mites have been detected from mattresses, pillows, carpets, upholstered furniture, bed covers, clothes, soft toys and any woven material. The principal allergen of the dust mite is found in its feces. A gram of dust may contain 1,000 mites and 250,000 fecal particles. Mite antigen is easily measured in the air during house cleaning activities. Dust mites do not bite and do not cause harm to humans, other than by triggering allergies/asthma.  Ways to decrease your exposure to dust mites in your home:  1. Encase mattresses, box springs and pillows with a mite-impermeable barrier or cover  2. Wash sheets, blankets and drapes weekly in hot water (130 F) with detergent and dry them in a dryer on the hot setting.  3. Have the room cleaned frequently with a vacuum cleaner and a damp dust-mop. For carpeting or rugs, vacuuming with a vacuum cleaner equipped with a high-efficiency particulate air (HEPA) filter. The dust mite allergic individual should not be in a room which is being cleaned and should wait 1 hour after cleaning before going into the room.  4. Do not sleep on upholstered furniture (eg, couches).  5. If possible  removing carpeting, upholstered furniture and drapery from the home is ideal. Horizontal blinds should be eliminated in the rooms where the person spends the most time (bedroom, study, television room). Washable vinyl, roller-type shades are optimal.  6. Remove all non-washable stuffed toys from the bedroom. Wash stuffed toys weekly like sheets and blankets above.  7. Reduce indoor humidity to less than 50%. Inexpensive humidity monitors can be purchased at most hardware stores. Do not use a humidifier as can make the problem worse and are not recommended.

## 2022-09-08 NOTE — Progress Notes (Signed)
[place] [address] Dept: [phone]  FOLLOW UP NOTE  Patient ID: Sheila Vincent, female    DOB: 1960/12/13  Age: 61 y.o. MRN: [id number] Date of Office Visit: 09/08/2022  Assessment  Chief Complaint: Other (Got the Shingles Shot and then felt sick afterwards. Nauseous and weak. Feels just like when she got COVID. Taste is gone. ), Headache, and Nasal Congestion  HPI Sheila Vincent is a 61 year old female who presents to the clinic for evaluation of sinus issues.  She was last seen in this clinic on 06/05/2022 by Dr. Ernst Bowler for evaluation of asthma, allergic rhinitis on allergen immunotherapy, and back lesions.  In the interim, she reports that she did receive allergen immunotherapy on Friday with no adverse reaction.  On Sunday she received her first Shingrix injection, and on Monday she reports that she began to experience body aches, vomiting 1 time today, weakness, muscle aches, and loss of taste.  She reports that about 2 weeks ago she began to experience nasal symptoms including nasal congestion mostly on the left side, sneezing, and postnasal drainage.  She reports headache mostly occurring underneath her left eye.  She continues Flonase 1 spray in each nostril twice a day, azelastine 2 sprays in each nostril twice a day, montelukast 10 mg once a day, cetirizine 10 mg once a day, and nasal saline rinses.  She continues allergen immunotherapy directed toward grass pollen, weed pollen, dust mite, cat,, dog, and mold once a month with no large or local reactions.  She reports a significant decrease in her symptoms of allergic conjunctivitis while continuing on allergen immunotherapy.  She began allergen immunotherapy in 2019.  She does have a dog that sleeps in her room.  Asthma is reported as moderately well controlled with occasional intermittent wheeze and cough that has mostly resolved.  She continues montelukast 10 mg once a day and has not used albuterol  over the last  several weeks.  She has not used Symbicort 160 since her last visit to this clinic.  She reports that she takes antibiotics about once a year for sinusitis with relief of symptoms. She denies fever and sick contacts. Her current medications are listed in the chart.   Drug Allergies:  Allergies  Allergen Reactions   Sulfa Antibiotics     Nervous cannot sit still, paranoid   Sulfasalazine Other (See Comments)    Intolerance to sulfa   Bactrim Anxiety    Nervous,"cant sit still", paranoid    Physical Exam: BP 116/62   Pulse 100   Temp 98.4 F (36.9 C) (Temporal)   Resp 16   Wt 148 lb 3.2 oz (67.2 kg)   BMI 27.11 kg/m    Physical Exam Vitals reviewed.  Constitutional:      Appearance: She is well-developed.  HENT:     Head: Normocephalic and atraumatic.     Right Ear: Tympanic membrane normal.     Left Ear: Tympanic membrane normal.     Nose:     Comments: Bilateral nares edematous and pale with thick clear nasal drainage noted.  Pharynx slightly erythematous with no exudate.  Ears normal.  Eyes normal. Eyes:     Conjunctiva/sclera: Conjunctivae normal.  Cardiovascular:     Rate and Rhythm: Normal rate and regular rhythm.     Heart sounds: Murmur heard.  Pulmonary:     Effort: Pulmonary effort is normal.     Breath sounds: Normal breath sounds.     Comments: Lungs clear to  auscultation Musculoskeletal:        General: Normal range of motion.     Cervical back: Normal range of motion and neck supple.  Skin:    General: Skin is warm and dry.  Neurological:     Mental Status: She is alert and oriented to person, place, and time.  Psychiatric:        Mood and Affect: Mood normal.        Behavior: Behavior normal.        Thought Content: Thought content normal.        Judgment: Judgment normal.     Diagnostics: Spirometry deferred due to viral-like symptoms  Assessment and Plan: 1. Moderate persistent asthma without complication   2. Seasonal and perennial  allergic rhinitis   3. Acute bacterial sinusitis   4. Heart murmur   5. At increased risk of exposure to COVID-19 virus     Meds ordered this encounter  Medications   amoxicillin-clavulanate (AUGMENTIN) 875-125 MG tablet    Sig: Take 1 tablet by mouth 2 (two) times daily for 10 days.    Dispense:  20 tablet    Refill:  0    Patient Instructions  Acute sinusitis Begin Augmentin 875 mg twice a day for sinus infection  Asthma Continue montelukast 10 mg once a day to prevent cough or wheeze Continue albuterol 2 puffs once every 4 hours as needed for cough or wheeze You may use albuterol 2 puffs 5 to 15 minutes before activity to decrease cough or wheeze For asthma flare, begin Symbicort 160-2 puffs twice a day for 2 weeks or until cough and wheeze free  Allergic rhinitis Continue allergen avoidance measures directed toward grass pollen, weed pollen, dust mite, cat, dog, and mold as listed below Continue allergen immunotherapy and have access to an epinephrine autoinjector set. Do not get your injection until you are feeling well Continue azelastine 2 sprays in each nostril twice a day as needed for nasal issues Continue cetirizine 10 mg once a day as needed for runny nose or itch Continue Flonase 2 sprays in each nostril once a day as needed for a stuffy nose Consider saline nasal rinses as needed for nasal symptoms. Use this before any medicated nasal sprays for best result For thick post nasal drainage, begin Mucinex [phone] mg twice a day and increase your fluid intake as tolerated  COVID Your COVID test was negative at today's visit.  Continue to mask and isolate if you are feeling symptoms  Call the clinic if this treatment plan is not working well for you.  Follow up in 2 months or sooner if needed.   Return in about 2 months (around 11/08/2022), or if symptoms worsen or fail to improve.    Thank you for the opportunity to care for this patient.  Please do not hesitate  to contact me with questions.  Gareth Morgan, FNP [facility]

## 2022-09-22 ENCOUNTER — Ambulatory Visit (INDEPENDENT_AMBULATORY_CARE_PROVIDER_SITE_OTHER): Payer: Medicare Other | Admitting: Family Medicine

## 2022-09-22 ENCOUNTER — Encounter: Payer: Self-pay | Admitting: Family Medicine

## 2022-09-22 ENCOUNTER — Other Ambulatory Visit: Payer: Self-pay

## 2022-09-22 VITALS — BP 118/78 | Temp 98.2°F | Resp 16 | Ht 64.0 in | Wt 145.3 lb

## 2022-09-22 DIAGNOSIS — J3089 Other allergic rhinitis: Secondary | ICD-10-CM

## 2022-09-22 DIAGNOSIS — B999 Unspecified infectious disease: Secondary | ICD-10-CM

## 2022-09-22 DIAGNOSIS — J4540 Moderate persistent asthma, uncomplicated: Secondary | ICD-10-CM

## 2022-09-22 DIAGNOSIS — J302 Other seasonal allergic rhinitis: Secondary | ICD-10-CM

## 2022-09-22 MED ORDER — BUDESONIDE-FORMOTEROL FUMARATE 160-4.5 MCG/ACT IN AERO: 2.0000 | INHALATION_SPRAY | Freq: Two times a day (BID) | RESPIRATORY_TRACT | 5 refills | Status: DC

## 2022-09-22 MED ORDER — BUDESONIDE 0.5 MG/2ML IN SUSP: 0.5000 mg | Freq: Two times a day (BID) | RESPIRATORY_TRACT | 5 refills | Status: DC

## 2022-09-22 NOTE — Progress Notes (Signed)
[place] [address] Dept: [phone]  FOLLOW UP NOTE  Patient ID: Sheila Vincent, female    DOB: 1960-12-25  Age: 61 y.o. MRN: [id number] Date of Office Visit: 09/22/2022  Assessment  Chief Complaint: Sinus Problem (Nose congestion/)  HPI Sheila Vincent is a 61 year old female who presents to the clinic for evaluation of sinus issues.  She was last seen in this clinic on 09/08/2022 by Gareth Morgan, FNP, for evaluation of acute bacterial sinusitis, asthma, allergic rhinitis, and had negative COVID testing in the clinic.  Her current problem list includes diabetes type 2 and Crohn's disease.  In the interim, she completed the dose of Augmentin that was prescribed for acute sinusitis at her last visit. She reports that her symptoms of sinusitis resolved, however, nasal congestion returned 2 days after the antibiotic was finished. At today's visit, she reports that she continues to experience intermittent nasal congestion which occurs in 1 nostril at a time, clear thin rhinorrhea, occasional sneeze, and postnasal drainage.  She continues montelukast 10 mg once a day, azelastine, Flonase, Mucinex, and cetirizine.  She continues allergen immunotherapy directed toward grass pollen, weed pollen, dust mite, mold, cat, and dog with no large or local reactions.  She reports a significant decrease in her symptoms of allergic rhinitis while continuing on allergen immunotherapy.  She does report that she has had 3 or 4 antibiotics this year as well as steroid injections for her hip.  Her current medications are listed in the chart.   Drug Allergies:  Allergies  Allergen Reactions   Sulfa Antibiotics     Nervous cannot sit still, paranoid   Sulfasalazine Other (See Comments)    Intolerance to sulfa   Bactrim Anxiety    Nervous,"cant sit still", paranoid    Physical Exam: BP 118/78   Temp 98.2 F (36.8 C) (Temporal)   Resp 16   Ht 5' 4"  (1.626 m)   Wt 145 lb 4.8 oz (65.9 kg)    SpO2 97%   BMI 24.94 kg/m    Physical Exam Vitals reviewed.  Constitutional:      Appearance: Normal appearance.  HENT:     Head: Normocephalic and atraumatic.     Right Ear: Tympanic membrane normal.     Left Ear: Tympanic membrane normal.     Nose:     Comments: Bilateral nares slightly erythematous with clear nasal drainage noted.  Pharynx slightly erythematous with no exudate.  Ears normal.  Eyes normal. Eyes:     Conjunctiva/sclera: Conjunctivae normal.  Cardiovascular:     Rate and Rhythm: Normal rate and regular rhythm.     Heart sounds: Normal heart sounds. No murmur heard. Pulmonary:     Effort: Pulmonary effort is normal.     Breath sounds: Normal breath sounds.     Comments: Lungs clear to auscultation Musculoskeletal:        General: Normal range of motion.     Cervical back: Normal range of motion and neck supple.  Skin:    General: Skin is warm and dry.  Neurological:     Mental Status: She is alert and oriented to person, place, and time.  Psychiatric:        Mood and Affect: Mood normal.        Behavior: Behavior normal.        Thought Content: Thought content normal.        Judgment: Judgment normal.     Assessment and Plan: 1. Recurrent infections  2. Moderate persistent asthma without complication   3. Seasonal and perennial allergic rhinitis     Meds ordered this encounter  Medications   budesonide-formoterol (SYMBICORT) 160-4.5 MCG/ACT inhaler    Sig: Inhale 2 puffs into the lungs 2 (two) times daily.    Dispense:  10.2 g    Refill:  5   budesonide (PULMICORT) 0.5 MG/2ML nebulizer solution    Sig: Take 2 mLs (0.5 mg total) by nebulization 2 (two) times daily.    Dispense:  2 mL    Refill:  5    Patient Instructions  Asthma Continue montelukast 10 mg once a day to prevent cough or wheeze Continue albuterol 2 puffs once every 4 hours as needed for cough or wheeze You may use albuterol 2 puffs 5 to 15 minutes before activity to decrease  cough or wheeze For asthma flare, begin Symbicort 160-2 puffs twice a day for 2 weeks or until cough and wheeze free  Allergic rhinitis Continue allergen avoidance measures directed toward grass pollen, weed pollen, dust mite, cat, dog, and mold as listed below Continue allergen immunotherapy and have access to an epinephrine autoinjector set. Do not get your injection until you are feeling well Continue azelastine 2 sprays in each nostril twice a day as needed for nasal issues Continue cetirizine 10 mg once a day as needed for runny nose or itch Begin nasal rinses with budesonide in saline nasal rinse twice a day. This will replace Flonase Consider saline nasal rinses as needed for nasal symptoms. Use this before any medicated nasal sprays for best result For thick post nasal drainage, begin Mucinex [phone] mg twice a day and increase your fluid intake as tolerated Suggest ENT consult for further evaluation of chronic congestion  Frequent infections We have ordered some blood work to help [place] evaluate your immune system. We will call you when results become available Keep track of infections, antibiotics, and steroid use  Call the clinic if this treatment plan is not working well for you.  Follow up in 2 months or sooner if needed.   Return in about 2 months (around 11/22/2022), or if symptoms worsen or fail to improve.    Thank you for the opportunity to care for this patient.  Please do not hesitate to contact me with questions.  Gareth Morgan, FNP [facility]

## 2022-09-22 NOTE — Patient Instructions (Addendum)
Asthma Continue montelukast 10 mg once a day to prevent cough or wheeze Continue albuterol 2 puffs once every 4 hours as needed for cough or wheeze You may use albuterol 2 puffs 5 to 15 minutes before activity to decrease cough or wheeze For asthma flare, begin Symbicort 160-2 puffs twice a day for 2 weeks or until cough and wheeze free  Allergic rhinitis Continue allergen avoidance measures directed toward grass pollen, weed pollen, dust mite, cat, dog, and mold as listed below Continue allergen immunotherapy and have access to an epinephrine autoinjector set. Do not get your injection until you are feeling well Continue azelastine 2 sprays in each nostril twice a day as needed for nasal issues Continue cetirizine 10 mg once a day as needed for runny nose or itch Begin nasal rinses with budesonide in saline nasal rinse twice a day. This will replace Flonase Consider saline nasal rinses as needed for nasal symptoms. Use this before any medicated nasal sprays for best result For thick post nasal drainage, begin Mucinex [phone] mg twice a day and increase your fluid intake as tolerated Suggest ENT consult for further evaluation of chronic congestion  Frequent infections We have ordered some blood work to help [place] evaluate your immune system. We will call you when results become available Keep track of infections, antibiotics, and steroid use  Call the clinic if this treatment plan is not working well for you.  Follow up in 2 months or sooner if needed.  Reducing Pollen Exposure The American Academy of Allergy, Asthma and Immunology suggests the following steps to reduce your exposure to pollen during allergy seasons. Do not hang sheets or clothing out to dry; pollen may collect on these items. Do not mow lawns or spend time around freshly cut grass; mowing stirs up pollen. Keep windows closed at night.  Keep car windows closed while driving. Minimize morning activities outdoors, a time  when pollen counts are usually at their highest. Stay indoors as much as possible when pollen counts or humidity is high and on windy days when pollen tends to remain in the air longer. Use air conditioning when possible.  Many air conditioners have filters that trap the pollen spores. Use a HEPA room air filter to remove pollen form the indoor air you breathe.  Control of Dog or Cat Allergen Avoidance is the best way to manage a dog or cat allergy. If you have a dog or cat and are allergic to dog or cats, consider removing the dog or cat from the home. If you have a dog or cat but don't want to find it a new home, or if your family wants a pet even though someone in the household is allergic, here are some strategies that may help keep symptoms at bay:  Keep the pet out of your bedroom and restrict it to only a few rooms. Be advised that keeping the dog or cat in only one room will not limit the allergens to that room. Don't pet, hug or kiss the dog or cat; if you do, wash your hands with soap and water. High-efficiency particulate air (HEPA) cleaners run continuously in a bedroom or living room can reduce allergen levels over time. Regular use of a high-efficiency vacuum cleaner or a central vacuum can reduce allergen levels. Giving your dog or cat a bath at least once a week can reduce airborne allergen.  Control of Mold Allergen Mold and fungi can grow on a variety of surfaces provided certain temperature  and moisture conditions exist.  Outdoor molds grow on plants, decaying vegetation and soil.  The major outdoor mold, Alternaria and Cladosporium, are found in very high numbers during hot and dry conditions.  Generally, a late Summer - Fall peak is seen for common outdoor fungal spores.  Rain will temporarily lower outdoor mold spore count, but counts rise rapidly when the rainy period ends.  The most important indoor molds are Aspergillus and Penicillium.  Dark, humid and poorly ventilated  basements are ideal sites for mold growth.  The next most common sites of mold growth are the bathroom and the kitchen.  Outdoor [employer] Use air conditioning and keep windows closed Avoid exposure to decaying vegetation. Avoid leaf raking. Avoid grain handling. Consider wearing a face mask if working in moldy areas.  Indoor Mold Control Maintain humidity below 50%. Clean washable surfaces with 5% bleach solution. Remove sources e.g. Contaminated carpets.   Control of Dust Mite Allergen Dust mites play a major role in allergic asthma and rhinitis. They occur in environments with high humidity wherever human skin is found. Dust mites absorb humidity from the atmosphere (ie, they do not drink) and feed on organic matter (including shed human and animal skin). Dust mites are a microscopic type of insect that you cannot see with the naked eye. High levels of dust mites have been detected from mattresses, pillows, carpets, upholstered furniture, bed covers, clothes, soft toys and any woven material. The principal allergen of the dust mite is found in its feces. A gram of dust may contain 1,000 mites and 250,000 fecal particles. Mite antigen is easily measured in the air during house cleaning activities. Dust mites do not bite and do not cause harm to humans, other than by triggering allergies/asthma.  Ways to decrease your exposure to dust mites in your home:  1. Encase mattresses, box springs and pillows with a mite-impermeable barrier or cover  2. Wash sheets, blankets and drapes weekly in hot water (130 F) with detergent and dry them in a dryer on the hot setting.  3. Have the room cleaned frequently with a vacuum cleaner and a damp dust-mop. For carpeting or rugs, vacuuming with a vacuum cleaner equipped with a high-efficiency particulate air (HEPA) filter. The dust mite allergic individual should not be in a room which is being cleaned and should wait 1 hour after cleaning before going  into the room.  4. Do not sleep on upholstered furniture (eg, couches).  5. If possible removing carpeting, upholstered furniture and drapery from the home is ideal. Horizontal blinds should be eliminated in the rooms where the person spends the most time (bedroom, study, television room). Washable vinyl, roller-type shades are optimal.  6. Remove all non-washable stuffed toys from the bedroom. Wash stuffed toys weekly like sheets and blankets above.  7. Reduce indoor humidity to less than 50%. Inexpensive humidity monitors can be purchased at most hardware stores. Do not use a humidifier as can make the problem worse and are not recommended.

## 2022-09-23 ENCOUNTER — Other Ambulatory Visit: Payer: Self-pay | Admitting: Orthopaedic Surgery

## 2022-10-02 LAB — STREP PNEUMONIAE 23 SEROTYPES IGG
Pneumo Ab Type 1*: 8.8 ug/mL (ref >1.3)
Pneumo Ab Type 12 (12F)*: 0.3 ug/mL — ABNORMAL LOW (ref >1.3)
Pneumo Ab Type 14*: >18.7 ug/mL (ref >1.3)
Pneumo Ab Type 17 (17F)*: 4 ug/mL (ref >1.3)
Pneumo Ab Type 19 (19F)*: 32.2 ug/mL (ref >1.3)
Pneumo Ab Type 2*: 6 ug/mL (ref >1.3)
Pneumo Ab Type 20*: 13.3 ug/mL (ref >1.3)
Pneumo Ab Type 22 (22F)*: 6.4 ug/mL (ref >1.3)
Pneumo Ab Type 23 (23F)*: >14.4 ug/mL (ref >1.3)
Pneumo Ab Type 26 (6B)*: 1.7 ug/mL (ref >1.3)
Pneumo Ab Type 3*: 1.7 ug/mL (ref >1.3)
Pneumo Ab Type 34 (10A)*: 2.3 ug/mL (ref >1.3)
Pneumo Ab Type 4*: >8.3 ug/mL (ref >1.3)
Pneumo Ab Type 43 (11A)*: >7.6 ug/mL (ref >1.3)
Pneumo Ab Type 5*: >47.3 ug/mL (ref >1.3)
Pneumo Ab Type 51 (7F)*: 9.7 ug/mL (ref >1.3)
Pneumo Ab Type 54 (15B)*: 3.3 ug/mL (ref >1.3)
Pneumo Ab Type 56 (18C)*: 7.4 ug/mL (ref >1.3)
Pneumo Ab Type 57 (19A)*: 6.5 ug/mL (ref >1.3)
Pneumo Ab Type 68 (9V)*: 4.4 ug/mL (ref >1.3)
Pneumo Ab Type 70 (33F)*: >10.1 ug/mL (ref >1.3)
Pneumo Ab Type 8*: 23.2 ug/mL (ref >1.3)
Pneumo Ab Type 9 (9N)*: 2.6 ug/mL (ref >1.3)

## 2022-10-02 LAB — DIPHTHERIA / TETANUS ANTIBODY PANEL
Diphtheria Ab: >3 IU/mL (ref <0.10)
Tetanus Ab, IgG: 0.65 IU/mL (ref <0.10)

## 2022-10-02 LAB — IGG, IGA, IGM
IgA/Immunoglobulin A, Serum: 156 mg/dL (ref 87–352)
IgG (Immunoglobin G), Serum: 1134 mg/dL (ref 586–1602)
IgM (Immunoglobulin M), Srm: 201 mg/dL (ref 26–217)

## 2022-10-02 NOTE — Telephone Encounter (Signed)
Tried to call pt back about he lab results lm for her to call us back

## 2022-10-02 NOTE — Progress Notes (Signed)
Can you please let this patient know that her immune function is within normal limits. Please have her call the clinic with any questions. Thank you

## 2022-10-02 NOTE — Telephone Encounter (Signed)
Patient was returning a call about labs [phone]

## 2022-10-03 ENCOUNTER — Ambulatory Visit (INDEPENDENT_AMBULATORY_CARE_PROVIDER_SITE_OTHER): Payer: Medicare Other

## 2022-10-03 DIAGNOSIS — J309 Allergic rhinitis, unspecified: Secondary | ICD-10-CM

## 2022-10-03 NOTE — Telephone Encounter (Signed)
Patient called back and was advised of lab results from [facility].

## 2022-10-10 ENCOUNTER — Encounter: Payer: Self-pay | Admitting: Internal Medicine

## 2022-10-10 ENCOUNTER — Ambulatory Visit (INDEPENDENT_AMBULATORY_CARE_PROVIDER_SITE_OTHER): Payer: Medicare Other | Admitting: Internal Medicine

## 2022-10-10 VITALS — BP 128/76 | HR 80 | Temp 97.9°F | Resp 16

## 2022-10-10 DIAGNOSIS — J3089 Other allergic rhinitis: Secondary | ICD-10-CM | POA: Diagnosis not present

## 2022-10-10 DIAGNOSIS — B9689 Other specified bacterial agents as the cause of diseases classified elsewhere: Secondary | ICD-10-CM

## 2022-10-10 DIAGNOSIS — J019 Acute sinusitis, unspecified: Secondary | ICD-10-CM | POA: Diagnosis not present

## 2022-10-10 DIAGNOSIS — J4540 Moderate persistent asthma, uncomplicated: Secondary | ICD-10-CM | POA: Diagnosis not present

## 2022-10-10 DIAGNOSIS — H1045 Other chronic allergic conjunctivitis: Secondary | ICD-10-CM

## 2022-10-10 DIAGNOSIS — B999 Unspecified infectious disease: Secondary | ICD-10-CM

## 2022-10-10 MED ORDER — METHYLPREDNISOLONE ACETATE 40 MG/ML IJ SUSP
40.0000 mg | Freq: Once | INTRAMUSCULAR | Status: AC
  Administered 2022-10-10: 40 mg via INTRAMUSCULAR

## 2022-10-10 MED ORDER — DOXYCYCLINE MONOHYDRATE 100 MG PO TABS
100.0000 mg | ORAL_TABLET | Freq: Two times a day (BID) | ORAL | 0 refills | Status: AC
Start: 2022-10-10 — End: 2022-10-17

## 2022-10-10 NOTE — Progress Notes (Signed)
Follow Up Note  RE: Sheila Vincent MRN: [id number] DOB: 1960/12/04 Date of Office Visit: 10/10/2022  Referring provider: Elwyn Reach, MD Primary care provider: Elwyn Reach, MD  Chief Complaint: Sinusitis (Finished amoxicillin and did not help and took mucinex for 10 days. Post nasal drip. ) and Allergies (Dry throat, hoarse, scratchy throat. )  History of Present Illness: I had the pleasure of seeing Sheila Vincent for a follow up visit at the [facility] [place] on 10/10/2022. She is a 61 y.o. female, who is being followed for persistent asthma, recurrent sinus infections, allergic rhinitis on AIT. Her previous allergy office visit was on 09/22/22 with Gareth Morgan, [place]. Today is a  acute visit for sinus infection  .  History obtained from patient, chart review .  She reports about 5 days of increased postnasal drip, hoarseness, throat clearing cough.  Mild sinus tenderness.  No difficulty equalizing ear.  Treated in early November with Augmentin although this did not fully clear her symptoms.  Denies any fevers, had some ear fullness.  Reports asthma is well-controlled.  She has not needed her albuterol.  She has Symbicort to start for any persistent flares per asthma action plan.  She is compliant with her montelukast.  She is on allergy injections with last x-ray on 10/03/2022.  Denies any large locals or systemic reactions.  Does feel like allergy injections have reduce her total burden of her allergic rhinitis.  Did not decrease her sinus infections however.  She continues to have sinus infections about 4-5 times a year.  Immune workup obtained at last visit showed normal complements, normal immunoglobulins normal strep pneumonia titers.  She has no history of nasal polyposis and denies any hyposmia or ageusia.  Assessment and Plan: Sheila Vincent is a 61 y.o. female with: Acute bacterial sinusitis  Moderate persistent asthma without complication  Other allergic  rhinitis  Other chronic allergic conjunctivitis of both eyes  Recurrent infections Plan: Patient Instructions  Asthma: well controlled  Continue montelukast 10 mg once a day to prevent cough or wheeze Continue albuterol 2 puffs once every 4 hours as needed for cough or wheeze You may use albuterol 2 puffs 5 to 15 minutes before activity to decrease cough or wheeze For asthma flare, begin Symbicort 160-2 puffs twice a day for 2 weeks or until cough and wheeze free  Allergic rhinitis: with acute sinus infection  -Depo 40 mg IM given today -Discussed ENT consult given recurrent infections although patient is hesitant right now -Start doxycycline 100 mg twice a day for 7 days Continue allergen avoidance measures directed toward grass pollen, weed pollen, dust mite, cat, dog, and mold as listed below Continue allergen immunotherapy and have access to an epinephrine autoinjector set. Do not get your injection until you are feeling well Continue azelastine 2 sprays in each nostril twice a day as needed for nasal issues Continue cetirizine 10 mg once a day as needed for runny nose or itch Continue nasal rinses with budesonide in saline nasal rinse twice a day.  Consider saline nasal rinses as needed for nasal symptoms. Use this before any medicated nasal sprays for best result For thick post nasal drainage, begin Mucinex [phone] mg twice a day and increase your fluid intake as tolerated  Frequent infections Lab work up was reassuring  I suspect you will need sinus surgery at some point, but this can wait until after your hip surgery  Keep track of infections, antibiotics, and steroid use  Follow up: 3 months   Thank you so much for letting me partake in your care today.  Don't hesitate to reach out if you have any additional concerns!  Roney Marion, MD  [facility] [place]  No follow-ups on file.  Meds ordered this encounter  Medications    methylPREDNISolone acetate (DEPO-MEDROL) injection 40 mg   doxycycline (ADOXA) 100 MG tablet    Sig: Take 1 tablet (100 mg total) by mouth 2 (two) times daily for 7 days.    Dispense:  14 tablet    Refill:  0    Lab Orders  No laboratory test(s) ordered today   Diagnostics: None done    Medication List:  Current Outpatient Medications  Medication Sig Dispense Refill   albuterol (PROVENTIL HFA) 108 (90 Base) MCG/ACT inhaler Use as directed for life threatening allergic reactions 1 each 1   atorvastatin (LIPITOR) 20 MG tablet Take 1 tablet (20 mg total) by mouth at bedtime. 90 tablet 0   azelastine (ASTELIN) 0.1 % nasal spray PLACE 2 SPRAY IN EACH NOSTRIL TWICE DAILY AS DIRECTED 30 mL 5   Blood Glucose Monitoring Suppl (ACCU-CHEK GUIDE) w/Device KIT USE TO CHECK BLOOD SUGAR DAILY     budesonide (PULMICORT) 0.5 MG/2ML nebulizer solution Take 2 mLs (0.5 mg total) by nebulization 2 (two) times daily. 2 mL 5   clobetasol ointment (TEMOVATE) 1.57 % Apply 1 Application topically 2 (two) times daily. 60 g 2   doxycycline (ADOXA) 100 MG tablet Take 1 tablet (100 mg total) by mouth 2 (two) times daily for 7 days. 14 tablet 0   etodolac (LODINE) 400 MG tablet Take 1 tablet (400 mg total) by mouth 2 (two) times daily. 180 tablet 3   ezetimibe (ZETIA) 10 MG tablet TAKE 1 TABLET(10 MG) BY MOUTH DAILY 90 tablet 0   fenofibrate (TRICOR) 145 MG tablet Take 1 tablet (145 mg total) by mouth daily. 90 tablet 0   gabapentin (NEURONTIN) 300 MG capsule TAKE 1 CAPSULE(300 MG) BY MOUTH AT BEDTIME AS NEEDED FOR PAIN 30 capsule 3   levocetirizine (XYZAL) 5 MG tablet TAKE 1 TABLET(5 MG) BY MOUTH EVERY EVENING 30 tablet 5   mesalamine (LIALDA) 1.2 g EC tablet Take 4 tablets (4.8 g total) by mouth daily with breakfast. 120 tablet 5   metFORMIN (GLUCOPHAGE) 500 MG tablet Take 500 mg by mouth 2 (two) times daily.     montelukast (SINGULAIR) 10 MG tablet Take 1 tablet (10 mg total) by mouth at bedtime. 90 tablet 1    naproxen (NAPROSYN) 500 MG tablet Take 500 mg by mouth 2 (two) times daily. For hip pain     omeprazole (PRILOSEC) 20 MG capsule TAKE 1 CAPSULE(20 MG) BY MOUTH TWICE DAILY AS NEEDED 180 capsule 1   tiZANidine (ZANAFLEX) 4 MG tablet TAKE 1 TABLET(4 MG) BY MOUTH EVERY 8 HOURS AS NEEDED FOR MUSCLE SPASMS 60 tablet 0   budesonide-formoterol (SYMBICORT) 160-4.5 MCG/ACT inhaler Inhale 2 puffs into the lungs 2 (two) times daily. (Patient not taking: Reported on 10/10/2022) 10.2 g 5   fluticasone (FLONASE) 50 MCG/ACT nasal spray Place 2 sprays into both nostrils daily. (Patient not taking: Reported on 10/10/2022) 48 g 1   pantoprazole (PROTONIX) 40 MG tablet Take 40 mg by mouth daily. (Patient not taking: Reported on 10/10/2022)     No current facility-administered medications for this visit.   Allergies: Allergies  Allergen Reactions   Sulfa Antibiotics     Nervous cannot sit  still, paranoid   Sulfasalazine Other (See Comments)    Intolerance to sulfa   Bactrim Anxiety    Nervous,"cant sit still", paranoid   I reviewed her past medical history, social history, family history, and environmental history and no significant changes have been reported from her previous visit.  ROS: All others negative except as noted per HPI.   Objective: BP 128/76   Pulse 80   Temp 97.9 F (36.6 C) (Temporal)   Resp 16   SpO2 98%  There is no height or weight on file to calculate BMI. General Appearance:  Alert, cooperative, no distress, appears stated age  Head:  Normocephalic, without obvious abnormality, atraumatic  Eyes:  Conjunctiva clear, EOM's intact  Nose: Nares normal,  clear rhinorrhea with erythematous nasal mucosa, hypertrophic turbinates, no visible anterior polyps, and septum midline, serous fluid in the right tympanic membrane.  No bulging.  Throat: Lips, tongue normal; teeth and gums normal, no tonsillar exudate and + cobblestoning  Neck: Supple, symmetrical  Lungs:   clear to auscultation  bilaterally, Respirations unlabored, intermittent dry coughing  Heart:  regular rate and rhythm and no murmur, Appears well perfused  Extremities: No edema  Skin: Skin color, texture, turgor normal, no rashes or lesions on visualized portions of skin   Neurologic: No gross deficits   Previous notes and tests were reviewed. The plan was reviewed with the patient/family, and all questions/concerned were addressed.  It was my pleasure to see Sheila Vincent today and participate in her care. Please feel free to contact me with any questions or concerns.  Sincerely,  Roney Marion, MD  Allergy & Immunology  [facility] Office: [phone]

## 2022-10-10 NOTE — Patient Instructions (Addendum)
Asthma: well controlled  Continue montelukast 10 mg once a day to prevent cough or wheeze Continue albuterol 2 puffs once every 4 hours as needed for cough or wheeze You may use albuterol 2 puffs 5 to 15 minutes before activity to decrease cough or wheeze For asthma flare, begin Symbicort 160-2 puffs twice a day for 2 weeks or until cough and wheeze free  Allergic rhinitis: with acute sinus infection  -Depo 40 mg IM given today -Discussed ENT consult given recurrent infections although patient is hesitant right now -Start doxycycline 100 mg twice a day for 7 days Continue allergen avoidance measures directed toward grass pollen, weed pollen, dust mite, cat, dog, and mold as listed below Continue allergen immunotherapy and have access to an epinephrine autoinjector set. Do not get your injection until you are feeling well Continue azelastine 2 sprays in each nostril twice a day as needed for nasal issues Continue cetirizine 10 mg once a day as needed for runny nose or itch Continue nasal rinses with budesonide in saline nasal rinse twice a day.  Consider saline nasal rinses as needed for nasal symptoms. Use this before any medicated nasal sprays for best result For thick post nasal drainage, begin Mucinex [phone] mg twice a day and increase your fluid intake as tolerated  Frequent infections Lab work up was reassuring  I suspect you will need sinus surgery at some point, but this can wait until after your hip surgery  Keep track of infections, antibiotics, and steroid use  Follow up: 3 months   Thank you so much for letting me partake in your care today.  Don't hesitate to reach out if you have any additional concerns!  Roney Marion, MD  [facility] [place] [place]

## 2022-10-13 ENCOUNTER — Ambulatory Visit: Payer: Self-pay

## 2022-10-13 ENCOUNTER — Encounter: Payer: Self-pay | Admitting: Family Medicine

## 2022-10-13 ENCOUNTER — Other Ambulatory Visit: Payer: Self-pay

## 2022-10-13 ENCOUNTER — Ambulatory Visit (INDEPENDENT_AMBULATORY_CARE_PROVIDER_SITE_OTHER): Payer: Medicare Other | Admitting: Family Medicine

## 2022-10-13 VITALS — BP 124/68 | HR 86 | Temp 98.0°F | Resp 16 | Wt 144.1 lb

## 2022-10-13 DIAGNOSIS — Z9189 Other specified personal risk factors, not elsewhere classified: Secondary | ICD-10-CM

## 2022-10-13 DIAGNOSIS — J309 Allergic rhinitis, unspecified: Secondary | ICD-10-CM

## 2022-10-13 DIAGNOSIS — J302 Other seasonal allergic rhinitis: Secondary | ICD-10-CM

## 2022-10-13 DIAGNOSIS — B999 Unspecified infectious disease: Secondary | ICD-10-CM | POA: Diagnosis not present

## 2022-10-13 DIAGNOSIS — J4540 Moderate persistent asthma, uncomplicated: Secondary | ICD-10-CM | POA: Diagnosis not present

## 2022-10-13 NOTE — Progress Notes (Addendum)
[facility] [place] [address] Dept: [phone]  FOLLOW UP NOTE  Patient ID: Sheila Vincent, female    DOB: 06/02/1961  Age: 61 y.o. MRN: [id number] Date of Office Visit: 10/13/2022  Assessment  Chief Complaint: Other (Same day visit: same issue as last week, 2 more days of doxycyline, in the morning systems are worse with congestion, behind on allergy shots and she is concerned that this is going to become another issue)  HPI Sheila Vincent is a 61 year old female who presents to the clinic for a same-day visit for congestion.  She was last seen in this clinic on 10/10/2022 by Dr. Edison Pace for evaluation of acute bacterial sinusitis requiring Depo-Medrol injection in the clinic and doxycycline, asthma, and recurrent infection.  At today's visit, she reports that she continues to experience symptoms of allergic rhinitis including nasal congestion, sneezing, and postnasal drainage all of which are improving at this time.  She reports that she is mainly feels nasal congestion in the morning which clears throughout the day.  She continues budesonide nasal saline rinses at this time, however, she reports these are making her nostrils feel very dry and is interested in stopping this therapy at this time.  She continues cetirizine 10 mg once a day.  She continues allergen immunotherapy directed toward grass pollen, weed pollen, dust mite, cat, dog, and mold with no large or local reactions. For acute sinusitis, she continues Doxycycline 100 mg twice a day with 2 1/2 days left of her most recent prescription. She has not been to ENT for evaluation of nasal symptoms at this time. Asthma is reported as well-controlled with no symptoms including shortness of breath, cough, or wheeze with activity or rest.  She has not used albuterol or Symbicort since her last visit to this clinic.  Her current medications are listed in the chart.   Drug Allergies:  Allergies  Allergen Reactions   Sulfa Antibiotics      Nervous cannot sit still, paranoid   Sulfasalazine Other (See Comments)    Intolerance to sulfa   Bactrim Anxiety    Nervous,"cant sit still", paranoid    Physical Exam: BP 124/68   Pulse 86   Temp 98 F (36.7 C) (Temporal)   Resp 16   Wt 144 lb 1.6 oz (65.4 kg)   SpO2 99%   BMI 24.73 kg/m    Physical Exam Vitals reviewed.  Constitutional:      Appearance: Normal appearance.  HENT:     Head: Normocephalic and atraumatic.     Right Ear: Tympanic membrane normal.     Left Ear: Tympanic membrane normal.     Nose:     Comments: Slightly erythematous with clear nasal drainage noted.  Pharynx normal.  Ears normal.  Eyes normal.    Mouth/Throat:     Pharynx: Oropharynx is clear.  Eyes:     Conjunctiva/sclera: Conjunctivae normal.  Cardiovascular:     Rate and Rhythm: Normal rate and regular rhythm.     Heart sounds: Normal heart sounds. No murmur heard. Pulmonary:     Effort: Pulmonary effort is normal.     Breath sounds: Normal breath sounds.     Comments: Lungs clear to auscultation Musculoskeletal:        General: Normal range of motion.     Cervical back: Normal range of motion and neck supple.  Skin:    General: Skin is warm and dry.  Neurological:     Mental Status: She is alert and  oriented to person, place, and time.  Psychiatric:        Mood and Affect: Mood normal.        Behavior: Behavior normal.        Thought Content: Thought content normal.        Judgment: Judgment normal.     Assessment and Plan: 1. Moderate persistent asthma without complication   2. Seasonal and perennial allergic rhinitis   3. Recurrent infections   4. At increased risk of exposure to COVID-19 virus      Patient Instructions  Asthma Continue montelukast 10 mg once a day to prevent cough or wheeze Continue albuterol 2 puffs once every 4 hours as needed for cough or wheeze You may use albuterol 2 puffs 5 to 15 minutes before activity to decrease cough or wheeze For  asthma flare, begin Symbicort 160-2 puffs twice a day for 2 weeks or until cough and wheeze free  Allergic rhinitis Continue allergen avoidance measures directed toward grass pollen, weed pollen, dust mite, cat, dog, and mold as listed below Continue allergen immunotherapy and have access to an epinephrine autoinjector set.  Begin Flonase or Nasacort 2 sprays in each nostril once a day. This will replace the budesonide and saline nasal rinses as you were not able to tolerate these Continue azelastine 2 sprays in each nostril twice a day as needed for nasal issues Continue cetirizine 10 mg once a day as needed for runny nose or itch Consider saline nasal rinses as needed for nasal symptoms. Use this before any medicated nasal sprays for best result For thick post nasal drainage, begin Mucinex [phone] mg twice a day and increase your fluid intake as tolerated We will place an ENT consult for further evaluation of chronic congestion  Frequent infections Keep track of infections, antibiotics, and steroid use  COVID Your COVID test in the clinic was negative.  Wear a mask if you are feeling symptoms.   Call the clinic if this treatment plan is not working well for you.  Follow up in 2 months or sooner if needed.   Return in about 2 months (around 12/14/2022), or if symptoms worsen or fail to improve.    Thank you for the opportunity to care for this patient.  Please do not hesitate to contact me with questions.  Gareth Morgan, FNP [facility]

## 2022-10-13 NOTE — Patient Instructions (Addendum)
Asthma Continue montelukast 10 mg once a day to prevent cough or wheeze Continue albuterol 2 puffs once every 4 hours as needed for cough or wheeze You may use albuterol 2 puffs 5 to 15 minutes before activity to decrease cough or wheeze For asthma flare, begin Symbicort 160-2 puffs twice a day for 2 weeks or until cough and wheeze free  Allergic rhinitis Continue allergen avoidance measures directed toward grass pollen, weed pollen, dust mite, cat, dog, and mold as listed below Continue allergen immunotherapy and have access to an epinephrine autoinjector set.  Begin Flonase or Nasacort 2 sprays in each nostril once a day. This will replace the budesonide and saline nasal rinses as you were not able to tolerate these Continue azelastine 2 sprays in each nostril twice a day as needed for nasal issues Continue cetirizine 10 mg once a day as needed for runny nose or itch Consider saline nasal rinses as needed for nasal symptoms. Use this before any medicated nasal sprays for best result For thick post nasal drainage, begin Mucinex [phone] mg twice a day and increase your fluid intake as tolerated We will place an ENT consult for further evaluation of chronic congestion  Frequent infections Keep track of infections, antibiotics, and steroid use  COVID Your COVID test in the clinic was negative.  Wear a mask if you are feeling symptoms.   Call the clinic if this treatment plan is not working well for you.  Follow up in 2 months or sooner if needed.  Reducing Pollen Exposure The American Academy of All[employer], [employer] and Immun[employer] suggests the following steps to reduce your exposure to pollen during allergy seasons. Do not hang sheets or clothing out to dry; pollen may collect on these items. Do not mow lawns or spend time around freshly cut grass; mowing stirs up pollen. Keep windows closed at night.  Keep car windows closed while driving. Minimize morning activities outdoors, a time when  pollen counts are usually at their highest. Stay indoors as much as possible when pollen counts or humidity is high and on windy days when pollen tends to remain in the air longer. Use air conditioning when possible.  Many air conditioners have filters that trap the pollen spores. Use a HEPA room air filter to remove pollen form the indoor air you breathe.  Control of Dog or Cat Allergen Avoidance is the best way to manage a dog or cat allergy. If you have a dog or cat and are allergic to dog or cats, consider removing the dog or cat from the home. If you have a dog or cat but don't want to find it a new home, or if your family wants a pet even though someone in the household is allergic, here are some strategies that may help keep symptoms at bay:  Keep the pet out of your bedroom and restrict it to only a few rooms. Be advised that keeping the dog or cat in only one room will not limit the allergens to that room. Don't pet, hug or kiss the dog or cat; if you do, wash your hands with soap and water. High-efficiency particulate air (HEPA) cleaners run continuously in a bedroom or living room can reduce allergen levels over time. Regular use of a high-efficiency vacuum cleaner or a central vacuum can reduce allergen levels. Giving your dog or cat a bath at least once a week can reduce airborne allergen.  Control of Mold Allergen Mold and fungi can grow on a  variety of surfaces provided certain temperature and moisture conditions exist.  Outdoor molds grow on plants, decaying vegetation and soil.  The major outdoor mold, Alternaria and Cladosporium, are found in very high numbers during hot and dry conditions.  Generally, a late Summer - Fall peak is seen for common outdoor fungal spores.  Rain will temporarily lower outdoor mold spore count, but counts rise rapidly when the rainy period ends.  The most important indoor molds are Aspergillus and Penicillium.  Dark, humid and poorly ventilated basements  are ideal sites for mold growth.  The next most common sites of mold growth are the bathroom and the kitchen.  Outdoor [employer] Use air conditioning and keep windows closed Avoid exposure to decaying vegetation. Avoid leaf raking. Avoid grain handling. Consider wearing a face mask if working in moldy areas.  Indoor Mold Control Maintain humidity below 50%. Clean washable surfaces with 5% bleach solution. Remove sources e.g. Contaminated carpets.   Control of Dust Mite Allergen Dust mites play a major role in allergic asthma and rhinitis. They occur in environments with high humidity wherever human skin is found. Dust mites absorb humidity from the atmosphere (ie, they do not drink) and feed on organic matter (including shed human and animal skin). Dust mites are a microscopic type of insect that you cannot see with the naked eye. High levels of dust mites have been detected from mattresses, pillows, carpets, upholstered furniture, bed covers, clothes, soft toys and any woven material. The principal allergen of the dust mite is found in its feces. A gram of dust may contain 1,000 mites and 250,000 fecal particles. Mite antigen is easily measured in the air during house cleaning activities. Dust mites do not bite and do not cause harm to humans, other than by triggering allergies/asthma.  Ways to decrease your exposure to dust mites in your home:  1. Encase mattresses, box springs and pillows with a mite-impermeable barrier or cover  2. Wash sheets, blankets and drapes weekly in hot water (130 F) with detergent and dry them in a dryer on the hot setting.  3. Have the room cleaned frequently with a vacuum cleaner and a damp dust-mop. For carpeting or rugs, vacuuming with a vacuum cleaner equipped with a high-efficiency particulate air (HEPA) filter. The dust mite allergic individual should not be in a room which is being cleaned and should wait 1 hour after cleaning before going into the  room.  4. Do not sleep on upholstered furniture (eg, couches).  5. If possible removing carpeting, upholstered furniture and drapery from the home is ideal. Horizontal blinds should be eliminated in the rooms where the person spends the most time (bedroom, study, television room). Washable vinyl, roller-type shades are optimal.  6. Remove all non-washable stuffed toys from the bedroom. Wash stuffed toys weekly like sheets and blankets above.  7. Reduce indoor humidity to less than 50%. Inexpensive humidity monitors can be purchased at most hardware stores. Do not use a humidifier as can make the problem worse and are not recommended

## 2022-10-16 ENCOUNTER — Telehealth: Payer: Self-pay

## 2022-10-16 NOTE — Telephone Encounter (Signed)
Patient is scheduled to see Ann Lions Spainhour, PA-C on 11/27/2022 @ 2:00 PM. Referral notes have been faxed to their office. Patient has seen their office before. I called and left a detailed message for the patient and sent a detailed letter in the mail.

## 2022-10-16 NOTE — Telephone Encounter (Signed)
Patient returned call to office. Confirmed appt at ENT location [address], [address], [place]. 11/27/2022 @ 2:00 PM. Pt verbalized understanding and appreciation.

## 2022-10-16 NOTE — Telephone Encounter (Signed)
-----   Message from Dara Hoyer, FNP sent at 09/22/2022 12:20 PM EST ----- Any chance we could refer to ENT to check anatomy for recurrent sinusitis? Thank you

## 2022-10-22 ENCOUNTER — Ambulatory Visit (INDEPENDENT_AMBULATORY_CARE_PROVIDER_SITE_OTHER): Payer: Medicare Other

## 2022-10-22 DIAGNOSIS — J309 Allergic rhinitis, unspecified: Secondary | ICD-10-CM | POA: Diagnosis not present

## 2022-10-23 ENCOUNTER — Encounter: Payer: Self-pay | Admitting: Orthopedic Surgery

## 2022-10-23 ENCOUNTER — Ambulatory Visit (INDEPENDENT_AMBULATORY_CARE_PROVIDER_SITE_OTHER): Payer: Medicare Other | Admitting: Orthopedic Surgery

## 2022-10-23 ENCOUNTER — Ambulatory Visit (INDEPENDENT_AMBULATORY_CARE_PROVIDER_SITE_OTHER): Payer: Medicare Other

## 2022-10-23 VITALS — BP 133/78 | HR 81 | Ht 64.0 in | Wt 144.3 lb

## 2022-10-23 DIAGNOSIS — M47816 Spondylosis without myelopathy or radiculopathy, lumbar region: Secondary | ICD-10-CM
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
## 2022-10-30 ENCOUNTER — Ambulatory Visit: Payer: Medicare Other | Admitting: Family Medicine

## 2022-10-30 ENCOUNTER — Ambulatory Visit (INDEPENDENT_AMBULATORY_CARE_PROVIDER_SITE_OTHER): Payer: Medicare Other

## 2022-10-30 DIAGNOSIS — J309 Allergic rhinitis, unspecified: Secondary | ICD-10-CM

## 2022-10-30 IMAGING — CR DG SHOULDER 2+V*R*
2 series · 2 of 2 positions shown · non-contrast
Comparison: None.

CLINICAL DATA: Shoulder pain

EXAM:
RIGHT SHOULDER - 2+ VIEW

[w shoulder internal right]
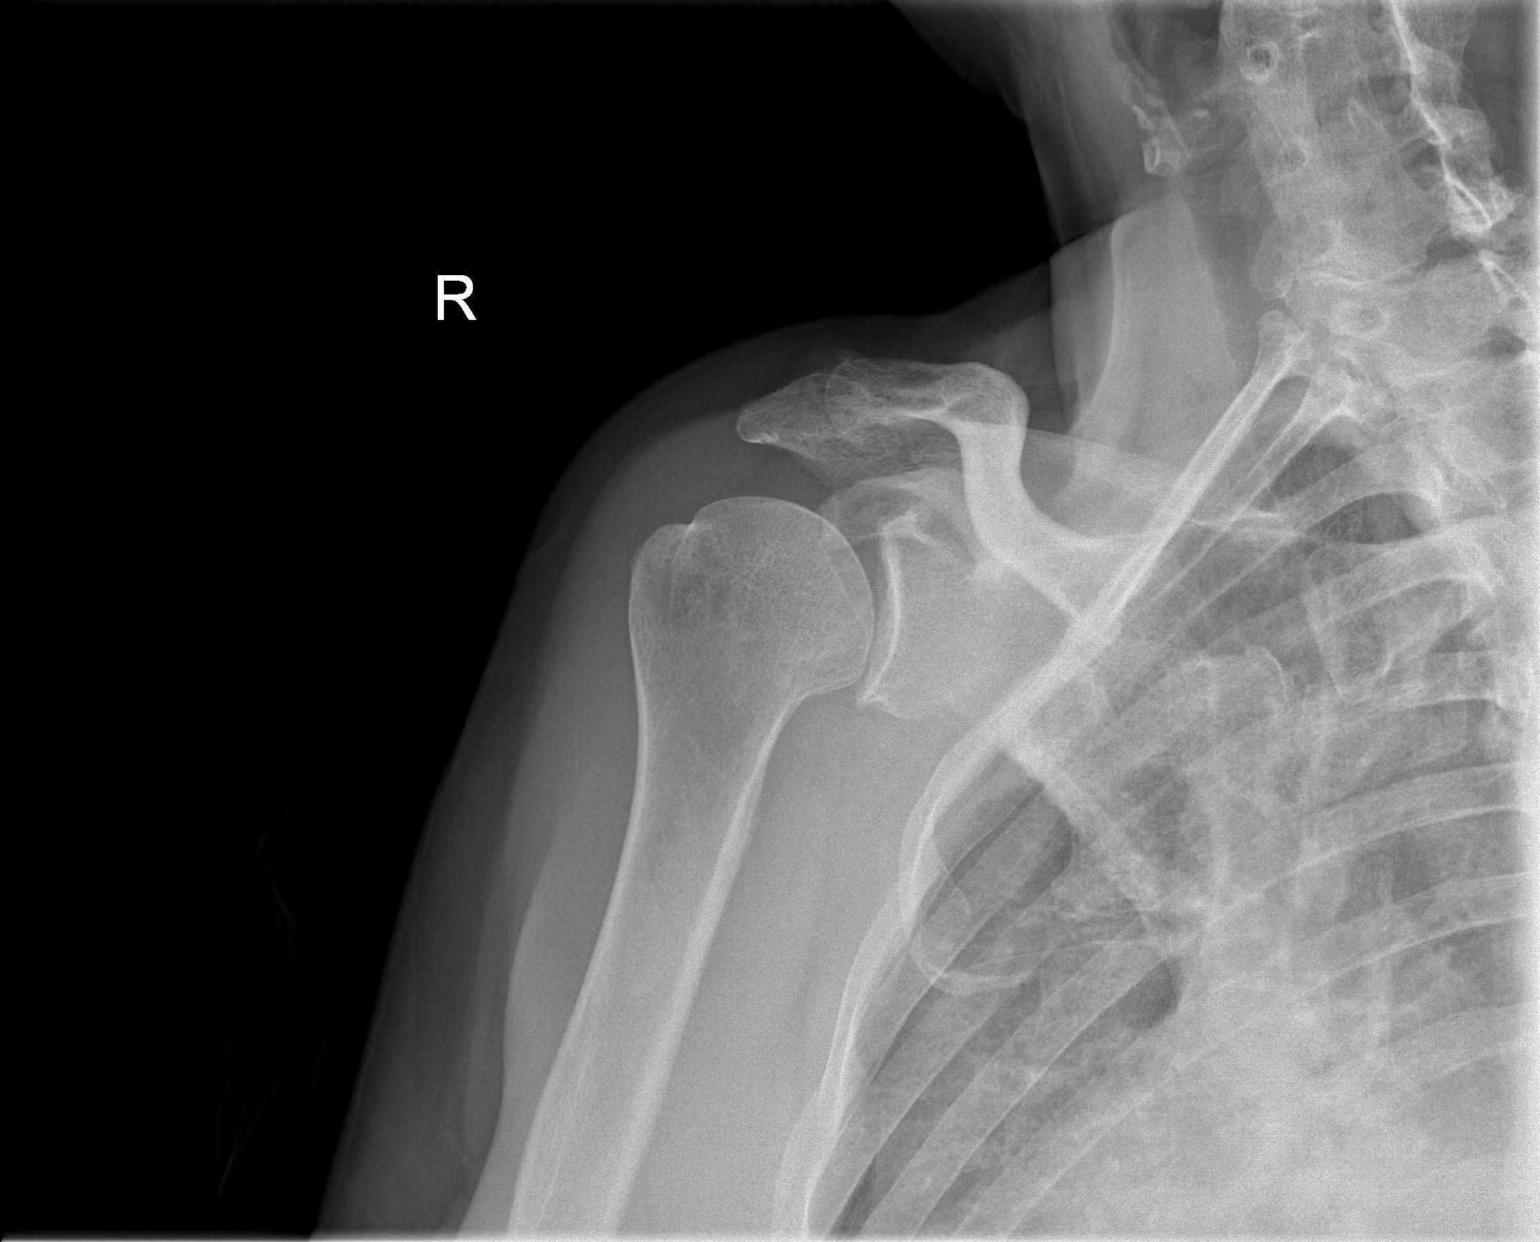

[w shoulder y-view right]
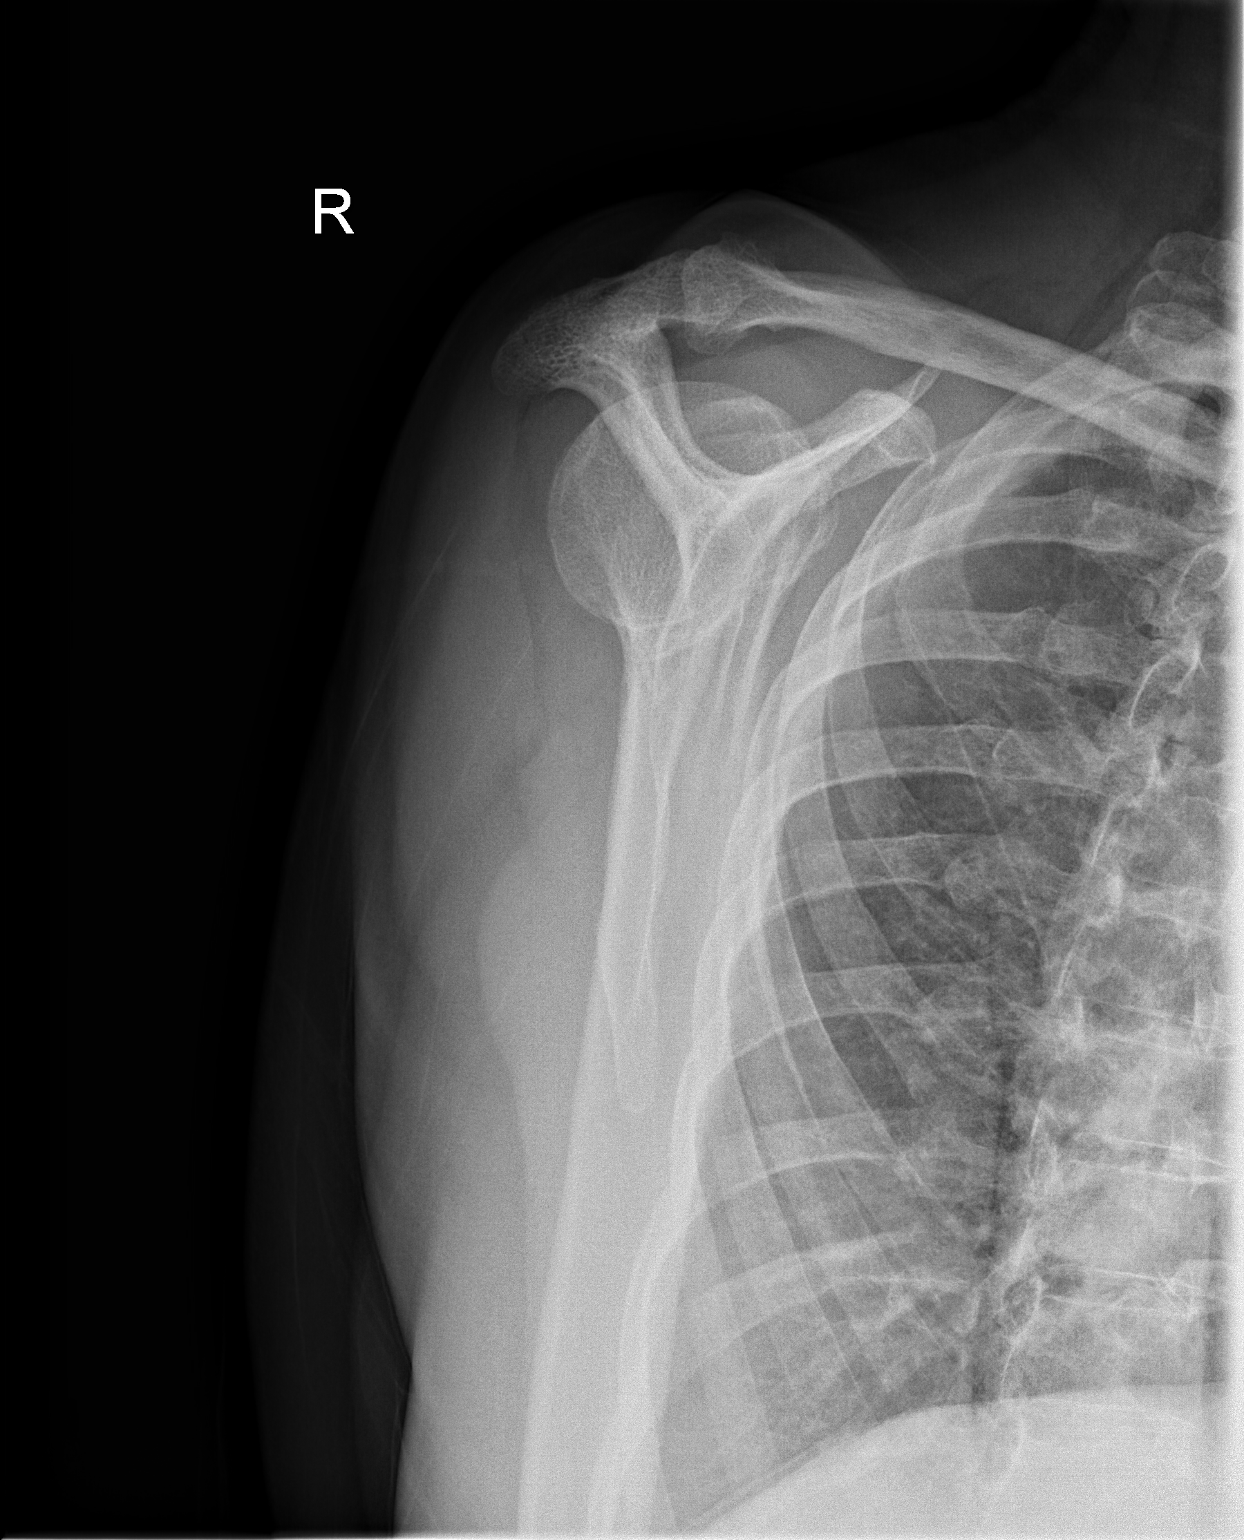

[2 of 2 positions shown; findings below may reference images not displayed]

FINDINGS: Normal alignment no fracture. Mild degenerative change in the
shoulder joint. Negative for rotator cuff impingement.
IMPRESSION: Mild degenerative change right shoulder.

## 2022-11-04 ENCOUNTER — Telehealth: Payer: Self-pay | Admitting: Hematology and Oncology

## 2022-11-04 NOTE — Telephone Encounter (Signed)
Patient called to r/s conflicting appointments. Patient r/s and notified.

## 2022-11-05 ENCOUNTER — Ambulatory Visit (INDEPENDENT_AMBULATORY_CARE_PROVIDER_SITE_OTHER): Payer: Medicare Other

## 2022-11-05 DIAGNOSIS — J309 Allergic rhinitis, unspecified: Secondary | ICD-10-CM | POA: Diagnosis not present

## 2022-11-06 ENCOUNTER — Encounter: Payer: Self-pay | Admitting: Orthopaedic Surgery

## 2022-11-06 ENCOUNTER — Ambulatory Visit (INDEPENDENT_AMBULATORY_CARE_PROVIDER_SITE_OTHER): Payer: Medicare Other | Admitting: Orthopaedic Surgery

## 2022-11-06 DIAGNOSIS — M25551 Pain in right hip: Secondary | ICD-10-CM

## 2022-11-06 DIAGNOSIS — M1611 Unilateral primary osteoarthritis, right hip: Secondary | ICD-10-CM | POA: Diagnosis not present

## 2022-11-06 NOTE — Progress Notes (Signed)
The patient is a 62 year old female well-known to me.  She has well documented debilitating arthritis involving her right hip.  I have been seeing her since 2020-2021.  We have had her scheduled several times for hip replacement surgery but there are things in her health status and little things that kept her from having surgery.  She is now the point where she wishes [place] to proceed with a right hip replacement.  She is fully aware what the surgery involves having [place] explained to her multiple times showing her hip replacement model and her x-rays.  The risk and benefits of surgery been described in detail.  She said all of her medical issues are now stable to the point she can proceed with hip replacement surgery.  I was able to review all of her notes within epic and her medications as well.  She is not on blood thinning medication.  She is a diabetic but reports an hemoglobin A1c of below 7.7.  I was able to review all of her labs in the chart as well.  Again I reviewed her previous x-rays showing severe end-stage arthritis of her right hip.  She had been scheduled and canceled her surgery at least 2-3 times.  She is walking with a significant limp.  Exam her right hip shows severe pain with any internal and external [place] with a lot of stiffness is well with rotation.  Her left hip moves normally.  She has had a recent intra-articular injection just 2 months ago on the right hip which helped for about 2 weeks but her pain is come back significant.  At this point we will work again on scheduling of her right hip replacement.  All questions concerns were answered and addressed.  We talked again in detail about the risk and benefits of the surgery as well as what to expect from an intraoperative and postoperative course.

## 2022-11-14 ENCOUNTER — Encounter: Payer: Self-pay | Admitting: Cardiology

## 2022-11-14 ENCOUNTER — Ambulatory Visit: Payer: Medicare Other | Admitting: Hematology and Oncology

## 2022-11-14 ENCOUNTER — Ambulatory Visit: Payer: Medicare Other | Admitting: Cardiology

## 2022-11-14 ENCOUNTER — Other Ambulatory Visit: Payer: Medicare Other

## 2022-11-14 VITALS — BP 134/79 | HR 82 | Resp 16 | Ht 64.0 in | Wt 144.0 lb

## 2022-11-14 DIAGNOSIS — E781 Pure hyperglyceridemia: Secondary | ICD-10-CM

## 2022-11-14 DIAGNOSIS — E7800 Pure hypercholesterolemia, unspecified: Secondary | ICD-10-CM

## 2022-11-14 DIAGNOSIS — I10 Essential (primary) hypertension: Secondary | ICD-10-CM

## 2022-11-14 DIAGNOSIS — E119 Type 2 diabetes mellitus without complications: Secondary | ICD-10-CM
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
## 2022-11-15 LAB — LIPID PANEL WITH LDL/HDL RATIO
Cholesterol, Total: 145 mg/dL (ref 100–199)
HDL: 49 mg/dL (ref >39)
LDL Chol Calc (NIH): 80 mg/dL (ref 0–99)
LDL/HDL Ratio: 1.6 ratio (ref 0.0–3.2)
Triglycerides: 84 mg/dL (ref 0–149)
VLDL Cholesterol Cal: 16 mg/dL (ref 5–40)

## 2022-11-15 LAB — CMP14+EGFR
ALT: 20 IU/L (ref 0–32)
AST: 20 IU/L (ref 0–40)
Albumin/Globulin Ratio: 1.9 (ref 1.2–2.2)
Albumin: 4.8 g/dL (ref 3.9–4.9)
Alkaline Phosphatase: 106 IU/L (ref 44–121)
BUN/Creatinine Ratio: 16 (ref 12–28)
BUN: 13 mg/dL (ref 8–27)
Bilirubin Total: 0.4 mg/dL (ref 0.0–1.2)
CO2: 22 mmol/L (ref 20–29)
Calcium: 10 mg/dL (ref 8.7–10.3)
Chloride: 105 mmol/L (ref 96–106)
Creatinine, Ser: 0.81 mg/dL (ref 0.57–1.00)
Globulin, Total: 2.5 g/dL (ref 1.5–4.5)
Glucose: 97 mg/dL (ref 70–99)
Potassium: 5.1 mmol/L (ref 3.5–5.2)
Sodium: 142 mmol/L (ref 134–144)
Total Protein: 7.3 g/dL (ref 6.0–8.5)
eGFR: 82 mL/min/{1.73_m2} (ref >59)

## 2022-11-15 LAB — LDL CHOLESTEROL, DIRECT: LDL Direct: 78 mg/dL (ref 0–99)

## 2022-11-18 ENCOUNTER — Other Ambulatory Visit: Payer: Self-pay

## 2022-11-18 ENCOUNTER — Other Ambulatory Visit: Payer: Self-pay | Admitting: Physician Assistant

## 2022-11-18 DIAGNOSIS — E781 Pure hyperglyceridemia: Secondary | ICD-10-CM

## 2022-11-18 MED ORDER — FENOFIBRATE 145 MG PO TABS: 145.0000 mg | ORAL_TABLET | Freq: Every day | ORAL | 0 refills | Status: DC

## 2022-11-18 NOTE — Progress Notes (Signed)
Spoke with patient about results. She acknowledged understanding and had no further questions.

## 2022-11-21 ENCOUNTER — Telehealth: Payer: Self-pay | Admitting: Orthopaedic Surgery

## 2022-11-21 ENCOUNTER — Other Ambulatory Visit: Payer: Self-pay

## 2022-11-21 NOTE — Telephone Encounter (Signed)
Pt called and states she is in sever pain and asking for a diff pain med. The one she is taking is not helping and she states she can't take it anymore. Please send to pharmacy on file. Pt phone number is [phone].

## 2022-11-21 NOTE — Telephone Encounter (Signed)
I called and advised pt. She stated she can't live much longer with this pain.stated it is getting worse everyday. Can she have surgery sooner?

## 2022-11-24 NOTE — Telephone Encounter (Signed)
I called and left voice mail that I would put her on cancellation list.  I do not currently have a place to move her surgery up to right now.

## 2022-11-27 DIAGNOSIS — J342 Deviated nasal septum: Secondary | ICD-10-CM | POA: Insufficient documentation

## 2022-11-27 DIAGNOSIS — J310 Chronic rhinitis: Secondary | ICD-10-CM | POA: Insufficient documentation

## 2022-12-01 ENCOUNTER — Ambulatory Visit: Payer: Medicare Other | Admitting: Sports Medicine

## 2022-12-02 ENCOUNTER — Ambulatory Visit (INDEPENDENT_AMBULATORY_CARE_PROVIDER_SITE_OTHER): Payer: 59

## 2022-12-02 DIAGNOSIS — J309 Allergic rhinitis, unspecified: Secondary | ICD-10-CM

## 2022-12-04 ENCOUNTER — Ambulatory Visit (INDEPENDENT_AMBULATORY_CARE_PROVIDER_SITE_OTHER): Payer: 59 | Admitting: Allergy & Immunology

## 2022-12-04 ENCOUNTER — Other Ambulatory Visit: Payer: Self-pay

## 2022-12-04 ENCOUNTER — Encounter: Payer: Self-pay | Admitting: Allergy & Immunology

## 2022-12-04 VITALS — BP 116/88 | HR 85 | Temp 98.2°F | Resp 20 | Ht 64.0 in | Wt 138.2 lb

## 2022-12-04 DIAGNOSIS — B999 Unspecified infectious disease: Secondary | ICD-10-CM

## 2022-12-04 DIAGNOSIS — K219 Gastro-esophageal reflux disease without esophagitis: Secondary | ICD-10-CM

## 2022-12-04 DIAGNOSIS — R0981 Nasal congestion: Secondary | ICD-10-CM | POA: Diagnosis not present

## 2022-12-04 DIAGNOSIS — J4540 Moderate persistent asthma, uncomplicated: Secondary | ICD-10-CM

## 2022-12-04 DIAGNOSIS — J302 Other seasonal allergic rhinitis: Secondary | ICD-10-CM

## 2022-12-04 DIAGNOSIS — J3089 Other allergic rhinitis: Secondary | ICD-10-CM | POA: Diagnosis not present

## 2022-12-04 DIAGNOSIS — J329 Chronic sinusitis, unspecified: Secondary | ICD-10-CM

## 2022-12-04 MED ORDER — MONTELUKAST SODIUM 10 MG PO TABS: 10.0000 mg | ORAL_TABLET | Freq: Every day | ORAL | 1 refills | Status: DC

## 2022-12-04 MED ORDER — OMEPRAZOLE 40 MG PO CPDR: 40.0000 mg | DELAYED_RELEASE_CAPSULE | Freq: Every day | ORAL | 1 refills | Status: DC

## 2022-12-04 MED ORDER — AZELASTINE HCL 0.1 % NA SOLN: NASAL | 1 refills | Status: DC

## 2022-12-04 MED ORDER — LEVOCETIRIZINE DIHYDROCHLORIDE 5 MG PO TABS: 5.0000 mg | ORAL_TABLET | Freq: Every evening | ORAL | 1 refills | Status: DC

## 2022-12-04 MED ORDER — PREDNISONE 10 MG PO TABS: ORAL_TABLET | ORAL | 0 refills | Status: DC

## 2022-12-04 NOTE — Patient Instructions (Addendum)
1. Moderate persistent asthma, uncomplicated - Spirometry (lung testing) looks awesome today!  - We are not going to make any changes at this time.  - Daily controller medication(s): Singulair (montelukast) '10mg'$  daily - Prior to physical activity: albuterol 2 puffs 10-15 minutes before physical activity. - Rescue medications: albuterol 4 puffs every 4-6 hours as needed - Changes during respiratory infections or worsening symptoms: Add on Symbicort to 2 puffs twice daily for TWO WEEKS. - Asthma control goals:  * Full participation in all desired activities (may need albuterol before activity) * Albuterol use two time or less a week on average (not counting use with activity) * Cough interfering with sleep two time or less a month * Oral steroids no more than once a year * No hospitalizations  2. Perennial and seasonal allergic rhinitis - Continue with allergy shots at the same schedule. - Continue with azelastine nasal spray one spray per nostril up to twice daily as needed. - Continue with your cetirizine and the montelukast.  - Get a humidifier to use at night. - We will add on prednisone to help with this with a three week taper to get you to your surgery.  - Sinus CT was completely clear and ENT said that no antibiotics were indicated.  - I would make a follow up with them BEFORE your surgery to discuss what to do.  3. Return in about 3 months (around 03/04/2023).    Please inform us of any Emergency Department visits, hospitalizations, or changes in symptoms. Call us before going to the ED for breathing or allergy symptoms since we might be able to fit you in for a sick visit. Feel free to contact us anytime with any questions, problems, or concerns.  It was a pleasure to see you again today!  Websites that have reliable patient information: 1. American Academy of Asthma, Allergy, and Immunology: www.aaaai.org 2. Food Allergy Research and Education (FARE): foodallergy.org 3.  Mothers of Asthmatics: http://www.asthmacommunitynetwork.org 4. American College of Allergy, Asthma, and Immunology: www.acaai.org   COVID-19 Vaccine Information can be found at: ShippingScam.co.uk For questions related to vaccine distribution or appointments, please email [email or web address] or call [phone].   We realize that you might be concerned about having an allergic reaction to the COVID19 vaccines. To help with that concern, WE ARE OFFERING THE COVID19 VACCINES IN OUR OFFICE! Ask the front desk for dates!     "Like" [place] on Facebook and Instagram for our latest updates!      A healthy democracy works best when [employer] participate! Make sure you are registered to vote! If you have moved or changed any of your contact information, you will need to get this updated before voting!  In some cases, you MAY be able to register to vote online: [email or web address]

## 2022-12-04 NOTE — Progress Notes (Signed)
He has not called the call  FOLLOW UP  Date of Service/Encounter:  12/04/22   Assessment:   Moderate persistent asthma, uncomplicated   Perennial and seasonal allergic rhinitis (grasses, ragweed, weeds, molds, cat, dog, dust mite) - on allergen immunotherapy (started June 2019 and reached maintenance in October 2019)  Chronic nasal congestion - with mostly normal sinus CT (consider migraine eval?)    Heart murmur and hypertension - followed by Cardiology   Iron deficiency anemia - followed by hematology   Inflammatory bowel disease - followed by Dr. Tarri Glenn   GERD   Chronic pain - with a hip surgery scheduled in late February 2024    Disabled status   Sheila Vincent presents today to discuss her ongoing nasal congestion.  She has been on Advair nasal spray and recently saw otolaryngology.  She had a sinus CT which was notable for slightly deviated septum but was otherwise normal.  She did have some mucoid sinus opacity.  ENT did not feel like antibiotics were warranted.  She is not very happy with that.  She tells me that she wants to be off fixed before her hip surgery coming up later this month.  Prednisone seems to be the only thing that works.  I went ahead and sent in a 3-week low-dose taper to get her to her surgery.  She asked for amoxicillin, but I declined saying that there was no evidence of the sinus CT that an antibiotic was indicated.  She is interested in a second opinion regarding her nasal congestion.  We will put that in today, but I did tell her that she is unlikely to get that scheduled before her hip surgery.  Plan/Recommendations:   1. Moderate persistent asthma, uncomplicated - Spirometry (lung testing) looks awesome today!  - We are not going to make any changes at this time.  - Daily controller medication(s): Singulair (montelukast) '10mg'$  daily - Prior to physical activity: albuterol 2 puffs 10-15 minutes before physical activity. - Rescue medications: albuterol  4 puffs every 4-6 hours as needed - Changes during respiratory infections or worsening symptoms: Add on Symbicort to 2 puffs twice daily for TWO WEEKS. - Asthma control goals:  * Full participation in all desired activities (may need albuterol before activity) * Albuterol use two time or less a week on average (not counting use with activity) * Cough interfering with sleep two time or less a month * Oral steroids no more than once a year * No hospitalizations  2. Perennial and seasonal allergic rhinitis - Continue with allergy shots at the same schedule. - Continue with azelastine nasal spray one spray per nostril up to twice daily as needed. - Continue with your cetirizine and the montelukast.  - Get a humidifier to use at night. - We will add on prednisone to help with this with a three week taper to get you to your surgery.  - Sinus CT was completely clear and ENT said that no antibiotics were indicated.  - I would make a follow up with them BEFORE your surgery to discuss what to do.  3. Return in about 3 months (around 03/04/2023).   Subjective:   Sheila Vincent is a 62 y.o. female presenting today for follow up of  Chief Complaint  Patient presents with   Follow-up   Asthma   Nasal Congestion   Sinusitis    C/o she has been suffering from this x1 month sores in both nostrils.     Sheila Vincent has a  history of the following: Patient Active Problem List   Diagnosis Date Noted   Recurrent infections 09/22/2022   Acute bacterial sinusitis 09/08/2022   At increased risk of exposure to COVID-19 virus 09/08/2022   Gait abnormality 06/16/2022   Right hip pain 06/16/2022   Numbness of right foot 06/16/2022   Cerebral vascular disease 06/16/2022   Crohn's disease of large bowel ([place]) 06/05/2022   Diarrhea 06/05/2022   Rectal bleeding 06/05/2022   IBD (inflammatory bowel disease) 08/08/2021   Symptomatic anemia 08/08/2021   Iron deficiency anemia due to chronic blood loss  08/08/2021   Unilateral primary osteoarthritis, right hip 01/18/2020   Laryngopharyngeal reflux (LPR) 09/09/2019   Sudden right hearing loss 09/09/2019   Tinnitus of right ear 09/09/2019   Eustachian tube dysfunction, right 09/05/2019   Bug bite 04/21/2019   Heart murmur 04/21/2019   De Quervain's syndrome (tenosynovitis) 02/23/2019   Primary osteoarthritis of first carpometacarpal joint of left hand 10/11/2018   GERD (gastroesophageal reflux disease) 04/06/2018   Cough, persistent 04/06/2018   Hypertension 03/30/2018   Seasonal and perennial allergic rhinitis 07/14/2015   Moderate persistent asthma 07/14/2015   Upper airway cough syndrome 12/06/2014   Right foot pain 07/06/2014   Low back pain 07/06/2014    History obtained from: chart review and patient.  Sheila Vincent is a 62 y.o. female presenting for a sick visit. She was last seen in December 2023 by Webb Silversmith one of our nurse practitioners.  At that time, she was continued on montelukast as well as albuterol.  She had Symbicort that she has only during flares.  For her allergic rhinitis, she was continued on her allergen immunotherapy.  Nasal spray was added at the last visit.  She was continued on cetirizine.  She had COVID testing that was negative.  At that time, she was already on doxycycline.  The doxycycline was started by Dr. Verneita Griffes knee but also gave her a Depo-Medrol injection for her sinusitis.  She did an appointment with ENT at the end of January 2024. She saw PA Jolene Provost. She had an exam that demonstrated fissures at the nasal vestibules and a mildly deviated septum. Mupirocin was recommended. Sinus CT was ordered. She tells me that she already knew the deviated septum.   Results were normal. See Telephone Result Note below:  Her sinus CT is clear other than one ethmoid air cell on the left (in between the eyes) that has some mucosal thickening. Scan otherwise negative with open drainage pathways. No antibiotics indicated  at this time. Continue with the ointment for the nose and saline spray/humidification for moisture, particularly during the winter months.   Since the last visit, she has not done well. She is still congested. She reports that the doxy did not do any good. She has some scabs in her nose and she reports that her nose is irritated.   She tells me that she has a hard time breathing through her nostrils still. She has nasal discharge and congestion. She reports that her nose is dry all of the time. She does not sleep with humidifier. She is currently using the azelastine. She has not used the Flonase in a week or so. She was told to use saline sprays and they "do not work" and then it closes back up again.  She has not been using a humidifier at all.  She was on prednisone from me and she did great. She was good for a number of weeks, maybe a month or  more. She is not interested in even discussing Afrin at all.  I brought that up during the visit today, and she adamantly refuses to even consider it.  She does report that amoxicillin always helps.  I again tell her that the sinus CT was completely clear.  She has tried nasal saline gel, but she tells me that it dries and becomes part of the problem.  She is having surgery on February 22nd.  They are going to be doing hip surgery.  She is going to be incapacitated for 8 weeks.  Asthma/Respiratory Symptom History: Her breathing has been under good control.  She has been using Symbicort on an as-needed basis.  She has not been using albuterol.  Overall, breathing is under good control within the lungs.  Her most annoying symptom now is nasal congestion.  Allergic Rhinitis Symptom History: She remains on her allergy shots. These are going well.  She has not had any large local reactions.  She thinks they had helped her symptoms, although she is having this ongoing nasal congestion described in detail above.  Analuisa is on allergen immunotherapy. She receives  two injections. Immunotherapy script #1 contains weeds, grasses, molds, dust mites, cat and dog. She currently receives 0.3m of the RED vial (1/100). Immunotherapy script #2 contains molds. She currently receives 0.544mof the RED vial (1/100). She started shots June of 2019 and reached maintenance in October 2019.   GERD Symptom History: She feels that omeprazole helped more.  She has pantoprazole on her list now, but she would like omeprazole symptom onset.  She has not been using an H2 blocker at all.  Otherwise, there have been no changes to her past medical history, surgical history, family history, or social history.    Review of Systems  Constitutional: Negative.  Negative for chills, fever, malaise/fatigue and weight loss.  HENT:  Positive for congestion and sinus pain. Negative for ear discharge and ear pain.   Eyes:  Negative for pain, discharge and redness.  Respiratory:  Negative for cough, sputum production, shortness of breath and wheezing.   Cardiovascular: Negative.  Negative for chest pain and palpitations.  Gastrointestinal:  Negative for abdominal pain, constipation, diarrhea, heartburn, nausea and vomiting.  Skin: Negative.  Negative for itching and rash.  Neurological:  Negative for dizziness and headaches.  Endo/Heme/Allergies:  Negative for environmental allergies. Does not bruise/bleed easily.       Objective:   Blood pressure 116/88, pulse 85, temperature 98.2 F (36.8 C), resp. rate 20, height '5\' 4"'$  (1.626 m), weight 138 lb 3.2 oz (62.7 kg), SpO2 98 %. Body mass index is 23.72 kg/m.    Physical Exam Vitals reviewed.  Constitutional:      Appearance: She is well-developed.     Comments: Very lovely. Cooperative with the exam.   HENT:     Head: Normocephalic and atraumatic.     Right Ear: Tympanic membrane, ear canal and external ear normal.     Left Ear: Tympanic membrane, ear canal and external ear normal.     Nose: No nasal deformity, septal  deviation, mucosal edema or rhinorrhea.     Right Turbinates: Enlarged and swollen. Not pale.     Left Turbinates: Enlarged and swollen. Not pale.     Right Sinus: No maxillary sinus tenderness or frontal sinus tenderness.     Left Sinus: No maxillary sinus tenderness or frontal sinus tenderness.     Comments: No nasal polyps noted.  Dried rhinorrhea in the bilateral  nares.  She does have some healing lesions especially on the left side.    Mouth/Throat:     Mouth: Mucous membranes are not pale and not dry.     Pharynx: Uvula midline.  Eyes:     General: Lids are normal. No allergic shiner.       Right eye: No discharge.        Left eye: No discharge.     Conjunctiva/sclera: Conjunctivae normal.     Right eye: Right conjunctiva is not injected. No chemosis.    Left eye: Left conjunctiva is not injected. No chemosis.    Pupils: Pupils are equal, round, and reactive to light.  Cardiovascular:     Rate and Rhythm: Normal rate and regular rhythm.     Heart sounds: Normal heart sounds.  Pulmonary:     Effort: Pulmonary effort is normal. No tachypnea, accessory muscle usage or respiratory distress.     Breath sounds: Normal breath sounds. No wheezing, rhonchi or rales.     Comments: Moving air well in all lung fields. No increased work of breathing noted.  Chest:     Chest wall: No tenderness.  Lymphadenopathy:     Cervical: No cervical adenopathy.  Skin:    General: Skin is warm.     Capillary Refill: Capillary refill takes less than 2 seconds.     Coloration: Skin is not pale.     Findings: No abrasion, erythema, petechiae or rash. Rash is not papular, urticarial or vesicular.  Neurological:     Mental Status: She is alert.  Psychiatric:        Behavior: Behavior is cooperative.      Diagnostic studies:    Spirometry: results normal (FEV1: 2.25/90%, FVC: 3.10/96%, FEV1/FVC: 73%).    Spirometry consistent with normal pattern.   Allergy Studies: none         Salvatore Marvel, MD  [facility]

## 2022-12-04 NOTE — Addendum Note (Signed)
Addended by: Larence Penning on: 12/04/2022 05:26 PM   Modules accepted: Orders

## 2022-12-05 ENCOUNTER — Ambulatory Visit: Payer: 59 | Admitting: Cardiology

## 2022-12-05 ENCOUNTER — Encounter: Payer: Self-pay | Admitting: Cardiology

## 2022-12-05 VITALS — BP 118/70 | HR 73 | Resp 17 | Ht 64.0 in | Wt 138.2 lb

## 2022-12-05 DIAGNOSIS — E781 Pure hyperglyceridemia: Secondary | ICD-10-CM

## 2022-12-05 DIAGNOSIS — E7800 Pure hypercholesterolemia, unspecified: Secondary | ICD-10-CM

## 2022-12-05 DIAGNOSIS — E119 Type 2 diabetes mellitus without complications: Secondary | ICD-10-CM

## 2022-12-05 DIAGNOSIS — I10 Essential (primary) hypertension: Secondary | ICD-10-CM
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
## 2022-12-08 DIAGNOSIS — J3489 Other specified disorders of nose and nasal sinuses: Secondary | ICD-10-CM | POA: Insufficient documentation

## 2022-12-08 DIAGNOSIS — J301 Allergic rhinitis due to pollen: Secondary | ICD-10-CM | POA: Insufficient documentation

## 2022-12-09 ENCOUNTER — Ambulatory Visit: Payer: Medicare Other | Admitting: Allergy & Immunology

## 2022-12-10 ENCOUNTER — Inpatient Hospital Stay: Payer: 59

## 2022-12-10 ENCOUNTER — Telehealth: Payer: Self-pay | Admitting: Allergy & Immunology

## 2022-12-10 ENCOUNTER — Inpatient Hospital Stay: Payer: 59 | Admitting: Hematology and Oncology

## 2022-12-10 NOTE — Telephone Encounter (Signed)
Patient has been referred to  [facility] [address] [place], [place] [address] [phone]  Referral and all corresponding notes have been faxed to their office.  They will reach out to patient to schedule.

## 2022-12-12 NOTE — Progress Notes (Signed)
Spoke with Judeen Hammans to request pre op orders in [facility].

## 2022-12-14 NOTE — Progress Notes (Signed)
COVID Vaccine received:  []$  No []$  Yes Date of any COVID positive Test in last 90 days:  PCP -  Gala Romney, MD  Cardiologist - Sunit Toila, DO ([place] 12-05-2022)  cardiac clearance  Chest x-ray - 05-27-2021 2v  Epic EKG -  08-14-2022  Epic Stress Test - 01-23-2022  Epic ECHO - 01-10-2022  Epic Cardiac Cath -   PCR screen: [x]$  Ordered & Completed                      []$   No Order but Needs PROFEND                      []$   N/A for this surgery  Surgery Plan:  []$  Ambulatory                            [x]$  Outpatient in bed                            []$  Admit  Anesthesia:    []$  General  []$  Spinal                           [x]$   Choice []$   MAC  Pacemaker / ICD device [x]$  No []$  Yes        Device order form faxed [x]$  No    []$   Yes      Faxed to:  Spinal Cord Stimulator:[x]$  No []$  Yes      (Remind patient to bring remote DOS) Other Implants:   History of Sleep Apnea? [x]$  No []$  Yes   CPAP used?- [x]$  No []$  Yes    Does the patient monitor blood sugar? []$  No []$  Yes  []$  N/A  Patient has: []$  Pre-DM   []$  DM1  [x]$   DM2 Does patient have a [employer] or Dexacom? []$  No []$  Yes   Fasting Blood Sugar Ranges-  Checks Blood Sugar _____ times a day  Last dose of GLP1 agonist-  GLP1 instructions:  Last dose of SGLT-2 inhibitors-  SGLT-2 instructions:  Other Diabetic medications/ instructions:  Metformin-500 mg bid ? Day before: take as usual.  DOS: Do not take Metformin.  Blood Thinner / Instructions: none Aspirin Instructions:  none  ERAS Protocol Ordered: []$  No  []$  Yes PRE-SURGERY []$  ENSURE  []$  G2  []$  No Drink Ordered  Patient is to be NPO after:   Comments: No MD orders as of 12-14-22  Activity level: Patient can / can not climb a flight of stairs without difficulty; []$  No CP  []$  No SOB, but would have ______   Patient can / can not perform ADLs without assistance.   Anesthesia review: DM2, HTN, asthma, mild murmur, anemia, ?cervical spine surgery- ?limited ROM  Patient  denies shortness of breath, fever, cough and chest pain at PAT appointment.  Patient verbalized understanding and agreement to the Pre-Surgical Instructions that were given to them at this PAT appointment. Patient was also educated of the need to review these PAT instructions again prior to his/her surgery.I reviewed the appropriate phone numbers to call if they have any and questions or concerns.

## 2022-12-14 NOTE — Patient Instructions (Signed)
SURGICAL WAITING ROOM VISITATION Patients having surgery or a procedure may have no more than 2 support people in the waiting area - these visitors may rotate in the visitor waiting room.   Due to an increase in RSV and influenza rates and associated hospitalizations, children ages 73 and under may not visit patients in [place]. If the patient needs to stay at the hospital during part of their recovery, the visitor guidelines for inpatient rooms apply.  PRE-OP VISITATION  Pre-op nurse will coordinate an appropriate time for 1 support person to accompany the patient in pre-op.  This support person may not rotate.  This visitor will be contacted when the time is appropriate for the visitor to come back in the pre-op area.  Please refer to the [facility] website for the visitor guidelines for Inpatients (after your surgery is over and you are in a regular room).  You are not required to quarantine at this time prior to your surgery. However, you must do this: Hand Hygiene often Do NOT share personal items Notify your provider if you are in close contact with someone who has COVID or you develop fever 100.4 or greater, new onset of sneezing, cough, sore throat, shortness of breath or body aches.  If you test positive for Covid or have been in contact with anyone that has tested positive in the last 10 days please notify you surgeon.    Your procedure is scheduled on:  Thursday  December 25, 2022  Report to [facility] Main Entrance: [place] entrance where the [employer] is available.   Report to admitting at:  05:15   AM  +++++Call this number if you have any questions or problems the morning of surgery [phone]  Do not eat food after Midnight the night prior to your surgery/procedure.  After Midnight you may have the following liquids until  04:30 AM DAY OF SURGERY  Clear Liquid Diet Water Black Coffee (sugar ok, NO MILK/CREAM OR CREAMERS)  Tea (sugar ok,  NO MILK/CREAM OR CREAMERS) regular and decaf                             Plain Jell-O  with no fruit (NO RED)                                           Fruit ices (not with fruit pulp, NO RED)                                     Popsicles (NO RED)                                                                  Juice: apple, WHITE grape, WHITE cranberry Sports drinks like Gatorade or Powerade (NO RED)                    The day of surgery:  Drink ONE (1) Pre-Surgery Clear Ensure or G2 at  04:30    AM the morning of  surgery. Drink in one sitting. Do not sip.  This drink was given to you during your hospital pre-op appointment visit. Nothing else to drink after completing the Pre-Surgery or G2 : No candy, chewing gum or throat lozenges.    FOLLOW  ANY ADDITIONAL PRE OP INSTRUCTIONS YOU RECEIVED FROM YOUR SURGEON'S OFFICE!!!   Oral Hygiene is also important to reduce your risk of infection.        Remember - BRUSH YOUR TEETH THE MORNING OF SURGERY WITH YOUR REGULAR TOOTHPASTE  Do NOT smoke after Midnight the night before surgery.   DIABETIC MEDICATION:   Metformin:  Day before surgery: Take your usual dosage.   Day Of your Surgery:  DO NOT TAKE Metformin.   Take ONLY these medicines the morning of surgery with A SIP OF WATER: Omeprazole (Prilosec), Amlopidine (Norvasc).  You may use your Astelin spray and the Albuterol inhaler if needed.                    You may not have any metal on your body including hair pins, jewelry, and body piercing  Do not wear make-up, lotions, powders, perfumes or deodorant  Do not wear nail polish including gel and S&S, artificial / acrylic nails, or any other type of covering on natural nails including finger and toenails. If you have artificial nails, gel coating, etc., that needs to be removed by a nail salon, Please have this removed prior to surgery. Not doing so may mean that your surgery could be cancelled or delayed if the Surgeon or anesthesia  staff feels like they are unable to monitor you safely.   Do not shave 48 hours prior to surgery to avoid nicks in your skin which may contribute to postoperative infections.    Contacts, Hearing Aids, dentures or bridgework may not be worn into surgery. DENTURES WILL BE REMOVED PRIOR TO SURGERY PLEASE DO NOT APPLY "Poly grip" OR ADHESIVES!!!  You may bring a small overnight bag with you on the day of surgery, only pack items that are not valuable. [facility] IS NOT RESPONSIBLE   FOR VALUABLES THAT ARE LOST OR STOLEN.   Do not bring your home medications to the hospital EXCEPT FOR YOUR ALBUTEROL INHALER. The Pharmacy will dispense medications listed on your medication list to you during your admission in the Hospital.  Special Instructions: Bring a copy of your healthcare power of attorney and living will documents the day of surgery, if you wish to have them scanned into your [facility] Records- EPIC  Please read over the following fact sheets you were given: IF YOU HAVE QUESTIONS ABOUT YOUR PRE-OP INSTRUCTIONS, PLEASE CALL [phone]  ([place])   [place] - Preparing for Surgery Before surgery, you can play an important role.  Because skin is not sterile, your skin needs to be as free of germs as possible.  You can reduce the number of germs on your skin by washing with CHG (chlorahexidine gluconate) soap before surgery.  CHG is an antiseptic cleaner which kills germs and bonds with the skin to continue killing germs even after washing. Please DO NOT use if you have an allergy to CHG or antibacterial soaps.  If your skin becomes reddened/irritated stop using the CHG and inform your nurse when you arrive at Short Stay. Do not shave (including legs and underarms) for at least 48 hours prior to the first CHG shower.  You may shave your face/neck.  Please follow these instructions carefully:  1.  Shower with CHG Soap the night before surgery and the  morning of surgery.  2.  If you  choose to wash your hair, wash your hair first as usual with your normal  shampoo.  3.  After you shampoo, rinse your hair and body thoroughly to remove the shampoo.                             4.  Use CHG as you would any other liquid soap.  You can apply chg directly to the skin and wash.  Gently with a scrungie or clean washcloth.  5.  Apply the CHG Soap to your body ONLY FROM THE NECK DOWN.   Do not use on face/ open                           Wound or open sores. Avoid contact with eyes, ears mouth and genitals (private parts).                       Wash face,  Genitals (private parts) with your normal soap.             6.  Wash thoroughly, paying special attention to the area where your  surgery  will be performed.  7.  Thoroughly rinse your body with warm water from the neck down.  8.  DO NOT shower/wash with your normal soap after using and rinsing off the CHG Soap.            9.  Pat yourself dry with a clean towel.            10.  Wear clean pajamas.            11.  Place clean sheets on your bed the night of your first shower and do not  sleep with pets.  ON THE DAY OF SURGERY : Do not apply any lotions/deodorants the morning of surgery.  Please wear clean clothes to the hospital/surgery center.    FAILURE TO FOLLOW THESE INSTRUCTIONS MAY RESULT IN THE CANCELLATION OF YOUR SURGERY  PATIENT SIGNATURE_________________________________  NURSE SIGNATURE__________________________________  ________________________________________________________________________         Sheila Vincent    An incentive spirometer is a tool that can help keep your lungs clear and active. This tool measures how well you are filling your lungs with each breath. Taking long deep breaths may help reverse or decrease the chance of developing breathing (pulmonary) problems (especially infection) following: A long period of time when you are unable to move or be active. BEFORE THE PROCEDURE  If  the spirometer includes an indicator to show your best effort, your nurse or respiratory therapist will set it to a desired goal. If possible, sit up straight or lean slightly forward. Try not to slouch. Hold the incentive spirometer in an upright position. INSTRUCTIONS FOR USE  Sit on the edge of your bed if possible, or sit up as far as you can in bed or on a chair. Hold the incentive spirometer in an upright position. Breathe out normally. Place the mouthpiece in your mouth and seal your lips tightly around it. Breathe in slowly and as deeply as possible, raising the piston or the ball toward the top of the column. Hold your breath for 3-5 seconds or for as long as possible. Allow the piston or ball to fall to the bottom  of the column. Remove the mouthpiece from your mouth and breathe out normally. Rest for a few seconds and repeat Steps 1 through 7 at least 10 times every 1-2 hours when you are awake. Take your time and take a few normal breaths between deep breaths. The spirometer may include an indicator to show your best effort. Use the indicator as a goal to work toward during each repetition. After each set of 10 deep breaths, practice coughing to be sure your lungs are clear. If you have an incision (the cut made at the time of surgery), support your incision when coughing by placing a pillow or rolled up towels firmly against it. Once you are able to get out of bed, walk around indoors and cough well. You may stop using the incentive spirometer when instructed by your caregiver.  RISKS AND COMPLICATIONS Take your time so you do not get dizzy or light-headed. If you are in pain, you may need to take or ask for pain medication before doing incentive spirometry. It is harder to take a deep breath if you are having pain. AFTER USE Rest and breathe slowly and easily. It can be helpful to keep track of a log of your progress. Your caregiver can provide you with a simple table to help with  this. If you are using the spirometer at home, follow these instructions: [place] IF:  You are having difficultly using the spirometer. You have trouble using the spirometer as often as instructed. Your pain medication is not giving enough relief while using the spirometer. You develop fever of 100.5 F (38.1 C) or higher.                                                                                                    SEEK IMMEDIATE MEDICAL CARE IF:  You cough up bloody sputum that had not been present before. You develop fever of 102 F (38.9 C) or greater. You develop worsening pain at or near the incision site. MAKE SURE YOU:  Understand these instructions. Will watch your condition. Will get help right away if you are not doing well or get worse. Document Released: 03/02/2007 Document Revised: 01/12/2012 Document Reviewed: 05/03/2007 [facility] Patient Information 2014 [place], [place].       WHAT IS A BLOOD TRANSFUSION? Blood Transfusion Information  A transfusion is the replacement of blood or some of its parts. Blood is made up of multiple cells which provide different functions. Red blood cells carry oxygen and are used for blood loss replacement. White blood cells fight against infection. Platelets control bleeding. Plasma helps clot blood. Other blood products are available for specialized needs, such as hemophilia or other clotting disorders. BEFORE THE TRANSFUSION  Who gives blood for transfusions?  Healthy volunteers who are fully evaluated to make sure their blood is safe. This is blood bank blood. Transfusion therapy is the safest it has ever been in the practice of medicine. Before blood is taken from a donor, a complete history is taken to make sure that person has no history of diseases nor engages in  risky social behavior (examples are intravenous drug use or sexual activity with multiple partners). The donor's travel history is screened to minimize  risk of transmitting infections, such as malaria. The donated blood is tested for signs of infectious diseases, such as HIV and hepatitis. The blood is then tested to be sure it is compatible with you in order to minimize the chance of a transfusion reaction. If you or a relative donates blood, this is often done in anticipation of surgery and is not appropriate for emergency situations. It takes many days to process the donated blood. RISKS AND COMPLICATIONS Although transfusion therapy is very safe and saves many lives, the main dangers of transfusion include:  Getting an infectious disease. Developing a transfusion reaction. This is an allergic reaction to something in the blood you were given. Every precaution is taken to prevent this. The decision to have a blood transfusion has been considered carefully by your caregiver before blood is given. Blood is not given unless the benefits outweigh the risks. AFTER THE TRANSFUSION Right after receiving a blood transfusion, you will usually feel much better and more energetic. This is especially true if your red blood cells have gotten low (anemic). The transfusion raises the level of the red blood cells which carry oxygen, and this usually causes an energy increase. The nurse administering the transfusion will monitor you carefully for complications. HOME CARE INSTRUCTIONS  No special instructions are needed after a transfusion. You may find your energy is better. Speak with your caregiver about any limitations on activity for underlying diseases you may have. SEEK MEDICAL CARE IF:  Your condition is not improving after your transfusion. You develop redness or irritation at the intravenous (IV) site. SEEK IMMEDIATE MEDICAL CARE IF:  Any of the following symptoms occur over the next 12 hours: Shaking chills. You have a temperature by mouth above 102 F (38.9 C), not controlled by medicine. Chest, back, or muscle pain. People around you feel you are  not acting correctly or are confused. Shortness of breath or difficulty breathing. Dizziness and fainting. You get a rash or develop hives. You have a decrease in urine output. Your urine turns a dark color or changes to pink, red, or brown. Any of the following symptoms occur over the next 10 days: You have a temperature by mouth above 102 F (38.9 C), not controlled by medicine. Shortness of breath. Weakness after normal activity. The white part of the eye turns yellow (jaundice). You have a decrease in the amount of urine or are urinating less often. Your urine turns a dark color or changes to pink, red, or brown. Document Released: 10/17/2000 Document Revised: 01/12/2012 Document Reviewed: 06/05/2008 [facility] Patient Information 2014 [place], [place].  _______________________________________________________________________

## 2022-12-15 ENCOUNTER — Other Ambulatory Visit: Payer: Self-pay | Admitting: Allergy & Immunology

## 2022-12-16 ENCOUNTER — Other Ambulatory Visit: Payer: Self-pay

## 2022-12-16 ENCOUNTER — Encounter (HOSPITAL_COMMUNITY)
Admission: RE | Admit: 2022-12-16 | Discharge: 2022-12-16 | Disposition: A | Discharge status: Home / Self Care | Payer: 59 | Source: Ambulatory Visit | Attending: Orthopaedic Surgery | Admitting: Orthopaedic Surgery

## 2022-12-16 ENCOUNTER — Encounter (HOSPITAL_COMMUNITY): Payer: Self-pay

## 2022-12-16 ENCOUNTER — Ambulatory Visit: Payer: 59 | Admitting: Cardiology

## 2022-12-16 VITALS — BP 100/55 | HR 94 | Temp 98.0°F | Resp 16 | Ht 64.0 in | Wt 139.0 lb

## 2022-12-16 DIAGNOSIS — E119 Type 2 diabetes mellitus without complications: Secondary | ICD-10-CM | POA: Diagnosis not present

## 2022-12-16 DIAGNOSIS — I10 Essential (primary) hypertension: Secondary | ICD-10-CM | POA: Insufficient documentation

## 2022-12-16 DIAGNOSIS — Z01812 Encounter for preprocedural laboratory examination: Secondary | ICD-10-CM | POA: Diagnosis present

## 2022-12-16 DIAGNOSIS — Z01818 Encounter for other preprocedural examination: Secondary | ICD-10-CM

## 2022-12-16 HISTORY — DX: Chronic kidney disease, unspecified: N18.9

## 2022-12-16 LAB — BASIC METABOLIC PANEL
Anion gap: 12 (ref 5–15)
BUN: 18 mg/dL (ref 8–23)
CO2: 22 mmol/L (ref 22–32)
Calcium: 9.8 mg/dL (ref 8.9–10.3)
Chloride: 106 mmol/L (ref 98–111)
Creatinine, Ser: 0.78 mg/dL (ref 0.44–1.00)
GFR, Estimated: >60 mL/min (ref >60)
Glucose, Bld: 94 mg/dL (ref 70–99)
Potassium: 4.9 mmol/L (ref 3.5–5.1)
Sodium: 140 mmol/L (ref 135–145)

## 2022-12-16 LAB — CBC
HCT: 41.9 % (ref 36.0–46.0)
Hemoglobin: 13.1 g/dL (ref 12.0–15.0)
MCH: 26.6 pg (ref 26.0–34.0)
MCHC: 31.3 g/dL (ref 30.0–36.0)
MCV: 85.2 fL (ref 80.0–100.0)
Platelets: 499 10*3/uL — ABNORMAL HIGH (ref 150–400)
RBC: 4.92 MIL/uL (ref 3.87–5.11)
RDW: 13.6 % (ref 11.5–15.5)
WBC: 18.1 10*3/uL — ABNORMAL HIGH (ref 4.0–10.5)
nRBC: 0 % (ref 0.0–0.2)

## 2022-12-16 LAB — SURGICAL PCR SCREEN
MRSA, PCR: NEGATIVE
Staphylococcus aureus: NEGATIVE

## 2022-12-16 LAB — HEMOGLOBIN A1C
Hgb A1c MFr Bld: 6.4 % — ABNORMAL HIGH (ref 4.8–5.6)
Mean Plasma Glucose: 136.98 mg/dL

## 2022-12-16 LAB — GLUCOSE, CAPILLARY: Glucose-Capillary: 94 mg/dL (ref 70–99)

## 2022-12-16 NOTE — Progress Notes (Incomplete)
COVID Vaccine received:  []$  No [x]$  Yes Date of any COVID positive Test in last 90 days:  None  PCP -  Gala Romney, MD  Cardiologist - Sunit Toila, DO ([place] 12-05-2022)  cardiac clearance  Chest x-ray - 05-27-2021 2v  Epic EKG -  08-14-2022  Epic Stress Test - 01-23-2022  Epic ECHO - 01-10-2022  Epic Cardiac Cath -   PCR screen: [x]$  Ordered & Completed                      []$   No Order but Needs PROFEND                      []$   N/A for this surgery  Surgery Plan:  []$  Ambulatory                            [x]$  Outpatient in bed                            []$  Admit  Anesthesia:    []$  General  []$  Spinal                           [x]$   Choice []$   MAC  Pacemaker / ICD device [x]$  No []$  Yes        Device order form faxed [x]$  No    []$   Yes      Faxed to:  Spinal Cord Stimulator:[x]$  No []$  Yes      (Remind patient to bring remote DOS) Other Implants:   History of Sleep Apnea? [x]$  No []$  Yes   CPAP used?- [x]$  No []$  Yes    Does the patient monitor blood sugar? []$  No [x]$  Yes  []$  N/A  Patient has: []$  Pre-DM   []$  DM1  [x]$   DM2 Does patient have a [employer] or Dexacom? []$  No []$  Yes   Fasting Blood Sugar Ranges-  Checks Blood Sugar _1 times a week  Metformin-500 mg bid ? Day before: take as usual.  DOS: Do not take Metformin.  Blood Thinner / Instructions: none Aspirin Instructions:  none  ERAS Protocol Ordered: []$  No  [x]$  Yes PRE-SURGERY []$  ENSURE  [x]$  G2  Patient is to be NPO after: 04:30 am  Activity level: Patient can climb a flight of stairs without difficulty; [x]$  No CP   but would have SOB and leg pain. Patient can perform ADLs without assistance.   Anesthesia review: DM2, HTN, asthma, mild murmur, anemia, cervical (C5-C7 Fusion) 1990 limited ROM  Patient denies shortness of breath, fever, cough and chest pain at PAT appointment.  Patient verbalized understanding and agreement to the Pre-Surgical Instructions that were given to them at this PAT appointment. Patient  was also educated of the need to review these PAT instructions again prior to his/her surgery.I reviewed the appropriate phone numbers to call if they have any and questions or concerns.

## 2022-12-18 NOTE — Anesthesia Preprocedure Evaluation (Addendum)
Anesthesia Evaluation  Patient identified by MRN, date of birth, ID band Patient awake    Reviewed: Allergy & Precautions, NPO status , Patient's Chart, lab work & pertinent test results  Airway Mallampati: II  TM Distance: >3 FB Neck ROM: Full    Dental no notable dental hx. (+) Teeth Intact, Dental Advisory Given, Implants   Pulmonary asthma    Pulmonary exam normal breath sounds clear to auscultation       Cardiovascular hypertension, Normal cardiovascular exam Rhythm:Regular Rate:Normal     Neuro/Psych  PSYCHIATRIC DISORDERS Anxiety Depression     Neuromuscular disease    GI/Hepatic ,GERD  ,,  Endo/Other  diabetes    Renal/GU Renal diseaseLab Results      Component                Value               Date                      CREATININE               0.78                12/16/2022                BUN                      18                  12/16/2022                NA                       140                 12/16/2022                K                        4.9                 12/16/2022                   Musculoskeletal  (+) Arthritis ,    Abdominal   Peds  Hematology  (+) Blood dyscrasia, anemia Lab Results      Component                Value               Date                      WBC                      18.1 (H)            12/16/2022                HGB                      13.1                12/16/2022                HCT  41.9                12/16/2022                    PLT                      499 (H)             12/16/2022              Anesthesia Other Findings All: Sulfa  Reproductive/Obstetrics                             Anesthesia Physical Anesthesia Plan  ASA: 3  Anesthesia Plan: Spinal and General   Post-op Pain Management: Minimal or no pain anticipated, Regional block* and Dilaudid IV   Induction: Intravenous  PONV Risk Score and Plan: 4  or greater and Treatment may vary due to age or medical condition, Midazolam, Propofol infusion and Ondansetron  Airway Management Planned: Oral ETT  Additional Equipment: None  Intra-op Plan:   Post-operative Plan: Extubation in OR  Informed Consent: I have reviewed the patients History and Physical, chart, labs and discussed the procedure including the risks, benefits and alternatives for the proposed anesthesia with the patient or authorized representative who has indicated his/her understanding and acceptance.     Dental advisory given  Plan Discussed with: CRNA, Anesthesiologist and Surgeon  Anesthesia Plan Comments: (See PAT note 12/16/2022 Sp if possible)       Anesthesia Quick Evaluation
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
## 2022-12-22 ENCOUNTER — Ambulatory Visit: Payer: Medicare Other | Admitting: Family Medicine

## 2022-12-23 ENCOUNTER — Ambulatory Visit: Payer: Medicare Other | Admitting: Allergy & Immunology

## 2022-12-23 ENCOUNTER — Ambulatory Visit (INDEPENDENT_AMBULATORY_CARE_PROVIDER_SITE_OTHER): Payer: 59

## 2022-12-23 DIAGNOSIS — J309 Allergic rhinitis, unspecified: Secondary | ICD-10-CM | POA: Diagnosis not present
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
## 2022-12-25 ENCOUNTER — Ambulatory Visit (HOSPITAL_COMMUNITY): Payer: 59 | Admitting: Physician Assistant

## 2022-12-25 ENCOUNTER — Inpatient Hospital Stay (HOSPITAL_COMMUNITY)
Admission: AD | Admit: 2022-12-25 | Discharge: 2022-12-27 | DRG: 470 | Disposition: A | Discharge status: Home Health | Payer: 59 | Attending: Orthopaedic Surgery | Admitting: Orthopaedic Surgery

## 2022-12-25 ENCOUNTER — Other Ambulatory Visit: Payer: Self-pay

## 2022-12-25 ENCOUNTER — Ambulatory Visit (HOSPITAL_COMMUNITY): Payer: 59 | Admitting: Anesthesiology

## 2022-12-25 ENCOUNTER — Encounter (HOSPITAL_COMMUNITY)
Admission: AD | Disposition: A | Discharge status: Home Health | Payer: Self-pay | Source: Home / Self Care | Attending: Orthopaedic Surgery

## 2022-12-25 ENCOUNTER — Observation Stay (HOSPITAL_COMMUNITY): Payer: 59

## 2022-12-25 ENCOUNTER — Encounter (HOSPITAL_COMMUNITY): Payer: Self-pay | Admitting: Orthopaedic Surgery

## 2022-12-25 ENCOUNTER — Ambulatory Visit (HOSPITAL_COMMUNITY): Payer: 59

## 2022-12-25 DIAGNOSIS — I10 Essential (primary) hypertension: Secondary | ICD-10-CM | POA: Diagnosis present

## 2022-12-25 DIAGNOSIS — M1611 Unilateral primary osteoarthritis, right hip: Secondary | ICD-10-CM | POA: Diagnosis not present

## 2022-12-25 DIAGNOSIS — H9191 Unspecified hearing loss, right ear: Secondary | ICD-10-CM | POA: Diagnosis present

## 2022-12-25 DIAGNOSIS — Z882 Allergy status to sulfonamides status: Secondary | ICD-10-CM

## 2022-12-25 DIAGNOSIS — Z841 Family history of disorders of kidney and ureter: Secondary | ICD-10-CM

## 2022-12-25 DIAGNOSIS — Z820 Family history of epilepsy and other diseases of the nervous system: Secondary | ICD-10-CM

## 2022-12-25 DIAGNOSIS — Z79899 Other long term (current) drug therapy: Secondary | ICD-10-CM

## 2022-12-25 DIAGNOSIS — Z833 Family history of diabetes mellitus: Secondary | ICD-10-CM

## 2022-12-25 DIAGNOSIS — Z825 Family history of asthma and other chronic lower respiratory diseases: Secondary | ICD-10-CM

## 2022-12-25 DIAGNOSIS — E119 Type 2 diabetes mellitus without complications: Secondary | ICD-10-CM | POA: Diagnosis not present

## 2022-12-25 DIAGNOSIS — J4540 Moderate persistent asthma, uncomplicated: Secondary | ICD-10-CM | POA: Diagnosis present

## 2022-12-25 DIAGNOSIS — Z7984 Long term (current) use of oral hypoglycemic drugs: Secondary | ICD-10-CM

## 2022-12-25 DIAGNOSIS — F418 Other specified anxiety disorders: Secondary | ICD-10-CM | POA: Diagnosis not present

## 2022-12-25 DIAGNOSIS — Z87442 Personal history of urinary calculi: Secondary | ICD-10-CM

## 2022-12-25 DIAGNOSIS — Z8616 Personal history of COVID-19: Secondary | ICD-10-CM

## 2022-12-25 DIAGNOSIS — D62 Acute posthemorrhagic anemia: Secondary | ICD-10-CM | POA: Diagnosis not present

## 2022-12-25 DIAGNOSIS — Z881 Allergy status to other antibiotic agents status: Secondary | ICD-10-CM

## 2022-12-25 DIAGNOSIS — Z96641 Presence of right artificial hip joint: Secondary | ICD-10-CM

## 2022-12-25 DIAGNOSIS — Z90710 Acquired absence of both cervix and uterus: Secondary | ICD-10-CM

## 2022-12-25 HISTORY — PX: TOTAL HIP ARTHROPLASTY: SHX124

## 2022-12-25 LAB — GLUCOSE, CAPILLARY
Glucose-Capillary: 122 mg/dL — ABNORMAL HIGH (ref 70–99)
Glucose-Capillary: 151 mg/dL — ABNORMAL HIGH (ref 70–99)

## 2022-12-25 LAB — TYPE AND SCREEN
ABO/RH(D): O POS
Antibody Screen: NEGATIVE

## 2022-12-25 SURGERY — ARTHROPLASTY, HIP, TOTAL, ANTERIOR APPROACH
Anesthesia: Spinal | Site: Hip | Laterality: Right

## 2022-12-25 MED ORDER — ACETAMINOPHEN 10 MG/ML IV SOLN
INTRAVENOUS | Status: AC
  Filled 2022-12-25: qty 100

## 2022-12-25 MED ORDER — HYDROMORPHONE HCL 1 MG/ML IJ SOLN
INTRAMUSCULAR | Status: AC
  Administered 2022-12-25: 0.5 mg via INTRAVENOUS
  Filled 2022-12-25: qty 2

## 2022-12-25 MED ORDER — OXYCODONE HCL 5 MG PO TABS
ORAL_TABLET | ORAL | Status: AC
  Administered 2022-12-25: 5 mg via ORAL
  Filled 2022-12-25: qty 1

## 2022-12-25 MED ORDER — 0.9 % SODIUM CHLORIDE (POUR BTL) OPTIME
TOPICAL | Status: DC | PRN
  Administered 2022-12-25: 2000 mL
  Administered 2022-12-25: 1000 mL

## 2022-12-25 MED ORDER — ATORVASTATIN CALCIUM 40 MG PO TABS
40.0000 mg | ORAL_TABLET | Freq: Every day | ORAL | Status: DC
  Administered 2022-12-25 – 2022-12-26 (×2): 40 mg via ORAL
  Filled 2022-12-25 (×2): qty 1

## 2022-12-25 MED ORDER — PANTOPRAZOLE SODIUM 40 MG PO TBEC
40.0000 mg | DELAYED_RELEASE_TABLET | Freq: Every day | ORAL | Status: DC
  Administered 2022-12-25 – 2022-12-27 (×3): 40 mg via ORAL
  Filled 2022-12-25 (×3): qty 1

## 2022-12-25 MED ORDER — LACTATED RINGERS IV SOLN: INTRAVENOUS | Status: DC

## 2022-12-25 MED ORDER — PROPOFOL 1000 MG/100ML IV EMUL
INTRAVENOUS | Status: AC
  Filled 2022-12-25: qty 100

## 2022-12-25 MED ORDER — IRBESARTAN 150 MG PO TABS
150.0000 mg | ORAL_TABLET | Freq: Every day | ORAL | Status: DC
  Administered 2022-12-26: 150 mg via ORAL
  Filled 2022-12-25: qty 1

## 2022-12-25 MED ORDER — OXYCODONE HCL 5 MG PO TABS: 5.0000 mg | ORAL_TABLET | Freq: Once | ORAL | Status: AC | PRN

## 2022-12-25 MED ORDER — POVIDONE-IODINE 10 % EX SWAB
2.0000 | Freq: Once | CUTANEOUS | Status: AC
  Administered 2022-12-25: 2 via TOPICAL

## 2022-12-25 MED ORDER — AMLODIPINE BESYLATE 5 MG PO TABS
5.0000 mg | ORAL_TABLET | Freq: Every day | ORAL | Status: DC
  Administered 2022-12-26: 5 mg via ORAL
  Filled 2022-12-25: qty 1

## 2022-12-25 MED ORDER — EPHEDRINE 5 MG/ML INJ
INTRAVENOUS | Status: AC
  Filled 2022-12-25: qty 5

## 2022-12-25 MED ORDER — MIDAZOLAM HCL 5 MG/5ML IJ SOLN
INTRAMUSCULAR | Status: DC | PRN
  Administered 2022-12-25: 2 mg via INTRAVENOUS

## 2022-12-25 MED ORDER — ACETAMINOPHEN 325 MG PO TABS
325.0000 mg | ORAL_TABLET | Freq: Four times a day (QID) | ORAL | Status: DC | PRN
  Administered 2022-12-26: 650 mg via ORAL
  Filled 2022-12-25: qty 2

## 2022-12-25 MED ORDER — ASPIRIN 81 MG PO CHEW
81.0000 mg | CHEWABLE_TABLET | Freq: Two times a day (BID) | ORAL | Status: DC
  Administered 2022-12-25 – 2022-12-27 (×4): 81 mg via ORAL
  Filled 2022-12-25 (×4): qty 1

## 2022-12-25 MED ORDER — ORAL CARE MOUTH RINSE: 15.0000 mL | Freq: Once | OROMUCOSAL | Status: AC

## 2022-12-25 MED ORDER — HYDROMORPHONE HCL 1 MG/ML IJ SOLN
0.2500 mg | INTRAMUSCULAR | Status: DC | PRN
  Administered 2022-12-25 (×3): 0.5 mg via INTRAVENOUS

## 2022-12-25 MED ORDER — EPHEDRINE SULFATE-NACL 50-0.9 MG/10ML-% IV SOSY
PREFILLED_SYRINGE | INTRAVENOUS | Status: DC | PRN
  Administered 2022-12-25: 5 mg via INTRAVENOUS

## 2022-12-25 MED ORDER — EZETIMIBE 10 MG PO TABS
10.0000 mg | ORAL_TABLET | Freq: Every day | ORAL | Status: DC
  Administered 2022-12-25 – 2022-12-27 (×3): 10 mg via ORAL
  Filled 2022-12-25 (×3): qty 1

## 2022-12-25 MED ORDER — DIPHENHYDRAMINE HCL 12.5 MG/5ML PO ELIX: 12.5000 mg | ORAL_SOLUTION | ORAL | Status: DC | PRN

## 2022-12-25 MED ORDER — METFORMIN HCL 500 MG PO TABS
500.0000 mg | ORAL_TABLET | Freq: Two times a day (BID) | ORAL | Status: DC
  Administered 2022-12-25 – 2022-12-27 (×4): 500 mg via ORAL
  Filled 2022-12-25 (×4): qty 1

## 2022-12-25 MED ORDER — ROCURONIUM BROMIDE 10 MG/ML (PF) SYRINGE
PREFILLED_SYRINGE | INTRAVENOUS | Status: AC
  Filled 2022-12-25: qty 10

## 2022-12-25 MED ORDER — MONTELUKAST SODIUM 10 MG PO TABS
10.0000 mg | ORAL_TABLET | Freq: Every day | ORAL | Status: DC
  Administered 2022-12-25 – 2022-12-26 (×2): 10 mg via ORAL
  Filled 2022-12-25 (×2): qty 1

## 2022-12-25 MED ORDER — DOCUSATE SODIUM 100 MG PO CAPS
100.0000 mg | ORAL_CAPSULE | Freq: Two times a day (BID) | ORAL | Status: DC
  Administered 2022-12-25 – 2022-12-27 (×5): 100 mg via ORAL
  Filled 2022-12-25 (×5): qty 1

## 2022-12-25 MED ORDER — METOCLOPRAMIDE HCL 5 MG PO TABS: 5.0000 mg | ORAL_TABLET | Freq: Three times a day (TID) | ORAL | Status: DC | PRN

## 2022-12-25 MED ORDER — ROCURONIUM BROMIDE 100 MG/10ML IV SOLN
INTRAVENOUS | Status: DC | PRN
  Administered 2022-12-25: 50 mg via INTRAVENOUS

## 2022-12-25 MED ORDER — POLYETHYLENE GLYCOL 3350 17 G PO PACK: 17.0000 g | PACK | Freq: Every day | ORAL | Status: DC | PRN

## 2022-12-25 MED ORDER — ONDANSETRON HCL 4 MG/2ML IJ SOLN
4.0000 mg | Freq: Four times a day (QID) | INTRAMUSCULAR | Status: DC | PRN
  Administered 2022-12-25 – 2022-12-26 (×2): 4 mg via INTRAVENOUS
  Filled 2022-12-25 (×2): qty 2

## 2022-12-25 MED ORDER — MESALAMINE 1.2 G PO TBEC
1.2000 g | DELAYED_RELEASE_TABLET | Freq: Every day | ORAL | Status: DC
  Administered 2022-12-26 – 2022-12-27 (×2): 4.8 g via ORAL
  Filled 2022-12-25 (×2): qty 4

## 2022-12-25 MED ORDER — SODIUM CHLORIDE 0.9 % IR SOLN
Status: DC | PRN
  Administered 2022-12-25: 1000 mL

## 2022-12-25 MED ORDER — TIZANIDINE HCL 4 MG PO TABS
4.0000 mg | ORAL_TABLET | Freq: Four times a day (QID) | ORAL | Status: DC | PRN
  Administered 2022-12-25 – 2022-12-27 (×3): 4 mg via ORAL
  Filled 2022-12-25 (×3): qty 1

## 2022-12-25 MED ORDER — ACETAMINOPHEN 10 MG/ML IV SOLN
1000.0000 mg | Freq: Once | INTRAVENOUS | Status: DC | PRN
  Administered 2022-12-25: 1000 mg via INTRAVENOUS

## 2022-12-25 MED ORDER — HYDROMORPHONE HCL 1 MG/ML IJ SOLN
INTRAMUSCULAR | Status: AC
  Filled 2022-12-25: qty 1

## 2022-12-25 MED ORDER — ALUM & MAG HYDROXIDE-SIMETH 200-200-20 MG/5ML PO SUSP: 30.0000 mL | ORAL | Status: DC | PRN

## 2022-12-25 MED ORDER — DEXAMETHASONE SODIUM PHOSPHATE 10 MG/ML IJ SOLN
INTRAMUSCULAR | Status: AC
  Filled 2022-12-25: qty 1

## 2022-12-25 MED ORDER — AMISULPRIDE (ANTIEMETIC) 5 MG/2ML IV SOLN: 10.0000 mg | Freq: Once | INTRAVENOUS | Status: DC | PRN

## 2022-12-25 MED ORDER — FENTANYL CITRATE (PF) 100 MCG/2ML IJ SOLN
INTRAMUSCULAR | Status: AC
  Filled 2022-12-25: qty 2

## 2022-12-25 MED ORDER — PHENOL 1.4 % MT LIQD
1.0000 | OROMUCOSAL | Status: DC | PRN
  Administered 2022-12-27: 1 via OROMUCOSAL
  Filled 2022-12-25: qty 177

## 2022-12-25 MED ORDER — SUGAMMADEX SODIUM 200 MG/2ML IV SOLN
INTRAVENOUS | Status: DC | PRN
  Administered 2022-12-25: 200 mg via INTRAVENOUS

## 2022-12-25 MED ORDER — METHOCARBAMOL 500 MG PO TABS: 500.0000 mg | ORAL_TABLET | Freq: Four times a day (QID) | ORAL | Status: DC | PRN

## 2022-12-25 MED ORDER — ONDANSETRON HCL 4 MG/2ML IJ SOLN
INTRAMUSCULAR | Status: DC | PRN
  Administered 2022-12-25: 4 mg via INTRAVENOUS

## 2022-12-25 MED ORDER — LIDOCAINE 2% (20 MG/ML) 5 ML SYRINGE
INTRAMUSCULAR | Status: DC | PRN
  Administered 2022-12-25: 100 mg via INTRAVENOUS

## 2022-12-25 MED ORDER — FENOFIBRATE 160 MG PO TABS
160.0000 mg | ORAL_TABLET | Freq: Every day | ORAL | Status: DC
  Administered 2022-12-25 – 2022-12-27 (×3): 160 mg via ORAL
  Filled 2022-12-25 (×3): qty 1

## 2022-12-25 MED ORDER — ONDANSETRON HCL 4 MG PO TABS
4.0000 mg | ORAL_TABLET | Freq: Four times a day (QID) | ORAL | Status: DC | PRN
  Administered 2022-12-27: 4 mg via ORAL
  Filled 2022-12-25: qty 1

## 2022-12-25 MED ORDER — DEXMEDETOMIDINE HCL IN NACL 80 MCG/20ML IV SOLN
INTRAVENOUS | Status: AC
  Filled 2022-12-25: qty 20

## 2022-12-25 MED ORDER — TRANEXAMIC ACID-NACL 1000-0.7 MG/100ML-% IV SOLN
1000.0000 mg | INTRAVENOUS | Status: AC
  Administered 2022-12-25: 1000 mg via INTRAVENOUS
  Filled 2022-12-25 (×2): qty 100

## 2022-12-25 MED ORDER — CEFAZOLIN SODIUM-DEXTROSE 1-4 GM/50ML-% IV SOLN
1.0000 g | Freq: Four times a day (QID) | INTRAVENOUS | Status: AC
  Administered 2022-12-25 (×2): 1 g via INTRAVENOUS
  Filled 2022-12-25 (×2): qty 50

## 2022-12-25 MED ORDER — METOCLOPRAMIDE HCL 5 MG/ML IJ SOLN: 5.0000 mg | Freq: Three times a day (TID) | INTRAMUSCULAR | Status: DC | PRN

## 2022-12-25 MED ORDER — HYDROMORPHONE HCL 1 MG/ML IJ SOLN: 0.2500 mg | INTRAMUSCULAR | Status: DC | PRN

## 2022-12-25 MED ORDER — MIDAZOLAM HCL 2 MG/2ML IJ SOLN
INTRAMUSCULAR | Status: AC
  Filled 2022-12-25: qty 2

## 2022-12-25 MED ORDER — ONDANSETRON HCL 4 MG/2ML IJ SOLN: 4.0000 mg | Freq: Once | INTRAMUSCULAR | Status: DC | PRN

## 2022-12-25 MED ORDER — DEXMEDETOMIDINE HCL IN NACL 80 MCG/20ML IV SOLN
INTRAVENOUS | Status: DC | PRN
  Administered 2022-12-25 (×2): 8 ug via BUCCAL

## 2022-12-25 MED ORDER — ALBUTEROL SULFATE (2.5 MG/3ML) 0.083% IN NEBU: 3.0000 mL | INHALATION_SOLUTION | Freq: Four times a day (QID) | RESPIRATORY_TRACT | Status: DC | PRN

## 2022-12-25 MED ORDER — LIDOCAINE HCL (PF) 2 % IJ SOLN
INTRAMUSCULAR | Status: AC
  Filled 2022-12-25: qty 5

## 2022-12-25 MED ORDER — HYDROMORPHONE HCL 1 MG/ML IJ SOLN: 0.5000 mg | INTRAMUSCULAR | Status: DC | PRN

## 2022-12-25 MED ORDER — CEFAZOLIN SODIUM-DEXTROSE 2-4 GM/100ML-% IV SOLN
2.0000 g | INTRAVENOUS | Status: AC
  Administered 2022-12-25: 2 g via INTRAVENOUS
  Filled 2022-12-25: qty 100

## 2022-12-25 MED ORDER — OXYCODONE HCL 5 MG/5ML PO SOLN: 5.0000 mg | Freq: Once | ORAL | Status: AC | PRN

## 2022-12-25 MED ORDER — FENTANYL CITRATE (PF) 100 MCG/2ML IJ SOLN
INTRAMUSCULAR | Status: DC | PRN
  Administered 2022-12-25: 25 ug via INTRAVENOUS
  Administered 2022-12-25: 50 ug via INTRAVENOUS
  Administered 2022-12-25: 25 ug via INTRAVENOUS
  Administered 2022-12-25 (×2): 50 ug via INTRAVENOUS

## 2022-12-25 MED ORDER — MOMETASONE FURO-FORMOTEROL FUM 200-5 MCG/ACT IN AERO
2.0000 | INHALATION_SPRAY | Freq: Two times a day (BID) | RESPIRATORY_TRACT | Status: DC
  Administered 2022-12-25 – 2022-12-27 (×4): 2 via RESPIRATORY_TRACT
  Filled 2022-12-25: qty 8.8

## 2022-12-25 MED ORDER — SODIUM CHLORIDE 0.9 % IV SOLN: INTRAVENOUS | Status: DC

## 2022-12-25 MED ORDER — OXYCODONE HCL 5 MG PO TABS
5.0000 mg | ORAL_TABLET | ORAL | Status: DC | PRN
  Administered 2022-12-25 – 2022-12-26 (×4): 5 mg via ORAL
  Administered 2022-12-26: 10 mg via ORAL
  Filled 2022-12-25 (×3): qty 2
  Filled 2022-12-25 (×4): qty 1

## 2022-12-25 MED ORDER — MENTHOL 3 MG MT LOZG: 1.0000 | LOZENGE | OROMUCOSAL | Status: DC | PRN

## 2022-12-25 MED ORDER — METHOCARBAMOL 500 MG IVPB - SIMPLE MED
INTRAVENOUS | Status: AC
  Administered 2022-12-25: 500 mg via INTRAVENOUS
  Filled 2022-12-25: qty 55

## 2022-12-25 MED ORDER — METHOCARBAMOL 500 MG IVPB - SIMPLE MED
500.0000 mg | Freq: Four times a day (QID) | INTRAVENOUS | Status: DC | PRN
  Filled 2022-12-25: qty 55

## 2022-12-25 MED ORDER — CHLORHEXIDINE GLUCONATE 0.12 % MT SOLN
15.0000 mL | Freq: Once | OROMUCOSAL | Status: AC
  Administered 2022-12-25: 15 mL via OROMUCOSAL

## 2022-12-25 MED ORDER — ONDANSETRON HCL 4 MG/2ML IJ SOLN
INTRAMUSCULAR | Status: AC
  Filled 2022-12-25: qty 2

## 2022-12-25 MED ORDER — PROPOFOL 500 MG/50ML IV EMUL
INTRAVENOUS | Status: DC | PRN
  Administered 2022-12-25: 40 ug/kg/min via INTRAVENOUS
  Administered 2022-12-25: 120 mg via INTRAVENOUS

## 2022-12-25 MED ORDER — OXYCODONE HCL 5 MG PO TABS
10.0000 mg | ORAL_TABLET | ORAL | Status: DC | PRN
  Administered 2022-12-26 – 2022-12-27 (×5): 10 mg via ORAL
  Filled 2022-12-25 (×3): qty 2

## 2022-12-25 SURGICAL SUPPLY — 42 items
APL SKNCLS STERI-STRIP NONHPOA (GAUZE/BANDAGES/DRESSINGS)
BAG COUNTER SPONGE SURGICOUNT (BAG) ×2 IMPLANT
BAG SPEC THK2 15X12 ZIP CLS (MISCELLANEOUS)
BAG SPNG CNTER NS LX DISP (BAG) ×1
BAG ZIPLOCK 12X15 (MISCELLANEOUS) IMPLANT
BENZOIN TINCTURE PRP APPL 2/3 (GAUZE/BANDAGES/DRESSINGS) IMPLANT
BLADE SAW SGTL 18X1.27X75 (BLADE) ×2 IMPLANT
COVER PERINEAL POST (MISCELLANEOUS) ×2 IMPLANT
COVER SURGICAL LIGHT HANDLE (MISCELLANEOUS) ×2 IMPLANT
DRAPE FOOT SWITCH (DRAPES) ×2 IMPLANT
DRAPE STERI IOBAN 125X83 (DRAPES) ×2 IMPLANT
DRAPE U-SHAPE 47X51 STRL (DRAPES) ×4 IMPLANT
DRSG AQUACEL AG ADV 3.5X10 (GAUZE/BANDAGES/DRESSINGS) ×2 IMPLANT
DURAPREP 26ML APPLICATOR (WOUND CARE) ×2 IMPLANT
ELECT REM PT RETURN 15FT ADLT (MISCELLANEOUS) ×2 IMPLANT
FEM STEM 12/14 TAPER SZ 4 HIP (Orthopedic Implant) ×1 IMPLANT
FEMORAL STEM 12/14 TPR SZ4 HIP (Orthopedic Implant) IMPLANT
GAUZE XEROFORM 1X8 LF (GAUZE/BANDAGES/DRESSINGS) ×2 IMPLANT
GLOVE BIO SURGEON STRL SZ7.5 (GLOVE) ×2 IMPLANT
GLOVE BIOGEL PI IND STRL 8 (GLOVE) ×4 IMPLANT
GLOVE ECLIPSE 8.0 STRL XLNG CF (GLOVE) ×2 IMPLANT
GOWN STRL REUS W/ TWL XL LVL3 (GOWN DISPOSABLE) ×4 IMPLANT
GOWN STRL REUS W/TWL XL LVL3 (GOWN DISPOSABLE) ×2
HANDPIECE INTERPULSE COAX TIP (DISPOSABLE) ×1
HEAD CERAMIC 36 PLUS5 (Hips) IMPLANT
HOLDER FOLEY CATH W/STRAP (MISCELLANEOUS) ×2 IMPLANT
KIT TURNOVER KIT A (KITS) IMPLANT
LINER NEUTRAL 52X36MM PLUS 4 (Liner) IMPLANT
PACK ANTERIOR HIP CUSTOM (KITS) ×2 IMPLANT
PIN SECTOR W/GRIP ACE CUP 52MM (Hips) IMPLANT
SET HNDPC FAN SPRY TIP SCT (DISPOSABLE) ×2 IMPLANT
STAPLER VISISTAT 35W (STAPLE) IMPLANT
STRIP CLOSURE SKIN 1/2X4 (GAUZE/BANDAGES/DRESSINGS) IMPLANT
SUT ETHIBOND NAB CT1 #1 30IN (SUTURE) ×2 IMPLANT
SUT ETHILON 2 0 PS N (SUTURE) IMPLANT
SUT MNCRL AB 4-0 PS2 18 (SUTURE) IMPLANT
SUT VIC AB 0 CT1 36 (SUTURE) ×2 IMPLANT
SUT VIC AB 1 CT1 36 (SUTURE) ×2 IMPLANT
SUT VIC AB 2-0 CT1 27 (SUTURE) ×2
SUT VIC AB 2-0 CT1 TAPERPNT 27 (SUTURE) ×4 IMPLANT
TRAY FOLEY MTR SLVR 16FR STAT (SET/KITS/TRAYS/PACK) IMPLANT
YANKAUER SUCT BULB TIP NO VENT (SUCTIONS) ×2 IMPLANT

## 2022-12-25 NOTE — Evaluation (Signed)
Physical Therapy Evaluation Patient Details Name: Sheila Vincent MRN: [id number] DOB: [birth year] Today's Date: 12/25/2022  History of Present Illness  Pt is a 62 year old female s/p Right THA on 12/25/22  Clinical Impression  Pt is s/p THA resulting in the deficits listed below (see PT Problem List).  Pt will benefit from skilled PT to increase their independence and safety with mobility to allow discharge to the venue listed below.  Pt assisted with ambulating in hallway and to bathroom.  Pt reports she has 8-9 steps to enter home and lives alone.  Pt states she does have someone that can check in on her and/or stay a day or two upon d/c. Pt reports she has a borrowed walker at this time that is missing some pieces so she would benefit from RW upon d/c as well.         Recommendations for follow up therapy are one component of a multi-disciplinary discharge planning process, led by the attending physician.  Recommendations may be updated based on patient status, additional functional criteria and insurance authorization.  Follow Up Recommendations Follow physician's recommendations for discharge plan and follow up therapies      Assistance Recommended at Discharge PRN  Patient can return home with the following  Assist for transportation;Help with stairs or ramp for entrance    Equipment Recommendations Rolling walker (2 wheels)  Recommendations for Other Services       Functional Status Assessment Patient has had a recent decline in their functional status and demonstrates the ability to make significant improvements in function in a reasonable and predictable amount of time.     Precautions / Restrictions Precautions Precautions: Fall Restrictions Weight Bearing Restrictions: No Other Position/Activity Restrictions: WBAT      Mobility  Bed Mobility Overal bed mobility: Needs Assistance Bed Mobility: Supine to Sit     Supine to sit: Min guard, HOB elevated     General  bed mobility comments: verbal cues for self assist, utilized gait belt to assist Rt LE    Transfers Overall transfer level: Needs assistance Equipment used: Rolling walker (2 wheels) Transfers: Sit to/from Stand Sit to Stand: Min assist, Min guard           General transfer comment: verbal cues for UE and LE positioning, initially min assist however min/guard from [facility] with cues    Ambulation/Gait Ambulation/Gait assistance: Min guard Gait Distance (Feet): 50 Feet Assistive device: Rolling walker (2 wheels) Gait Pattern/deviations: Step-to pattern, Decreased stance time - right, Antalgic       General Gait Details: verbal cues for sequence, RW positioning, step length  Stairs            Wheelchair Mobility    Modified Rankin (Stroke Patients Only)       Balance                                             Pertinent Vitals/Pain Pain Assessment Pain Assessment: 0-10 Pain Score: 4  Pain Location: right hip Pain Descriptors / Indicators: Sore, Grimacing, Operative site guarding Pain Intervention(s): Repositioned, Monitored during session    Home Living Family/patient expects to be discharged to:: Private residence Living Arrangements: Alone Available Help at Discharge: Friend(s);Available PRN/intermittently Type of Home: House Home Access: Stairs to enter Entrance Stairs-Rails: Left;Right;Can reach both Entrance Stairs-Number of Steps: 8-9   Home Layout: One level  Home Equipment: None      Prior Function Prior Level of Function : Independent/Modified Independent                     Hand Dominance        Extremity/Trunk Assessment        Lower Extremity Assessment Lower Extremity Assessment: RLE deficits/detail RLE Deficits / Details: anticipated post op hip weakness observed       Communication   Communication: No difficulties  Cognition Arousal/Alertness: Awake/alert Behavior During Therapy: WFL for tasks  assessed/performed Overall Cognitive Status: Within Functional Limits for tasks assessed                                          General Comments      Exercises     Assessment/Plan    PT Assessment Patient needs continued PT services  PT Problem List Decreased strength;Pain;Decreased activity tolerance;Decreased mobility;Decreased knowledge of use of DME       PT Treatment Interventions Stair training;DME instruction;Gait training;Therapeutic exercise;Balance training;Therapeutic activities;Functional mobility training;Patient/family education    PT Goals (Current goals can be found in the Care Plan section)  Acute Rehab PT Goals PT Goal Formulation: With patient Time For Goal Achievement: 01/01/23 Potential to Achieve Goals: Good    Frequency 7X/week     Co-evaluation               AM-PAC PT "6 Clicks" Mobility  Outcome Measure Help needed turning from your back to your side while in a flat bed without using bedrails?: A Little Help needed moving from lying on your back to sitting on the side of a flat bed without using bedrails?: A Little Help needed moving to and from a bed to a chair (including a wheelchair)?: A Little Help needed standing up from a chair using your arms (e.g., wheelchair or bedside chair)?: A Little Help needed to walk in hospital room?: A Little Help needed climbing 3-5 steps with a railing? : A Little 6 Click Score: 18    End of Session Equipment Utilized During Treatment: Gait belt Activity Tolerance: Patient tolerated treatment well Patient left: in chair;with call bell/phone within reach;with chair alarm set Nurse Communication: Mobility status PT Visit Diagnosis: Difficulty in walking, not elsewhere classified (R26.2)    Time: [id number] PT Time Calculation (min) (ACUTE ONLY): 26 min   Charges:   PT Evaluation $PT Eval Low Complexity: 1 Low PT Treatments $Gait Training: 8-22 mins      Jannette Spanner PT, DPT Physical  Therapist Acute Rehabilitation Services Preferred contact method: Secure Chat Weekend Pager Only: [phone] Office: [phone]   [name] 12/25/2022, 3:45 PM

## 2022-12-25 NOTE — Anesthesia Procedure Notes (Signed)
Spinal  Patient location during procedure: OR Start time: 12/25/2022 7:23 AM End time: 12/25/2022 7:27 AM Reason for block: surgical anesthesia Staffing Performed: anesthesiologist  Anesthesiologist: Barnet Glasgow, MD Performed by: Barnet Glasgow, MD Authorized by: Barnet Glasgow, MD   Preanesthetic Checklist Completed: patient identified, IV checked, risks and benefits discussed, surgical consent, monitors and equipment checked, pre-op evaluation and timeout performed Spinal Block Patient position: sitting Prep: DuraPrep and site prepped and draped Patient monitoring: heart rate, cardiac monitor, continuous pulse ox and blood pressure Approach: midline Location: L3-4 Injection technique: single-shot Needle Needle type: Pencan  Needle gauge: 24 G Needle length: 10 cm Assessment Sensory level: T4 Events: CSF return Additional Notes  1 Attempt at L2-3 midline  and 1 attempt at L3-4 midline unsucessful switch to GA (s). Pt tolerated procedure well.

## 2022-12-25 NOTE — Anesthesia Postprocedure Evaluation (Signed)
Anesthesia Post Note  Patient: Sheila Vincent  Procedure(s) Performed: RIGHT HIP ARTHROPLASTY ANTERIOR APPROACH (Right: Hip)     Patient location during evaluation: PACU Anesthesia Type: General Level of consciousness: awake and alert Pain management: pain level controlled Vital Signs Assessment: post-procedure vital signs reviewed and stable Respiratory status: spontaneous breathing, nonlabored ventilation, respiratory function stable and patient connected to nasal cannula oxygen Cardiovascular status: blood pressure returned to baseline and stable Postop Assessment: no apparent nausea or vomiting Anesthetic complications: no  No notable events documented.  Last Vitals:  Vitals:   12/25/22 1030 12/25/22 1055  BP: 121/66 (!) 150/71  Pulse: 73 70  Resp: 13 18  Temp:    SpO2: 100% 99%    Last Pain:  Vitals:   12/25/22 1055  TempSrc:   PainSc: 4                  Barnet Glasgow

## 2022-12-25 NOTE — Plan of Care (Signed)
  Problem: Activity: Goal: Ability to tolerate increased activity will improve Outcome: Progressing   Problem: Pain Management: Goal: Pain level will decrease with appropriate interventions Outcome: Progressing

## 2022-12-25 NOTE — Transfer of Care (Signed)
Immediate Anesthesia Transfer of Care Note  Patient: Hidie Tkaczyk  Procedure(s) Performed: RIGHT HIP ARTHROPLASTY ANTERIOR APPROACH (Right: Hip)  Patient Location: PACU  Anesthesia Type:General  Level of Consciousness: awake, alert , oriented, and patient cooperative  Airway & Oxygen Therapy: Patient Spontanous Breathing and Patient connected to face mask oxygen  Post-op Assessment: Report given to RN, Post -op Vital signs reviewed and stable, and Patient moving all extremities  Post vital signs: Reviewed and stable  Last Vitals:  Vitals Value Taken Time  BP 172/98 12/25/22 0915  Temp    Pulse 85 12/25/22 0915  Resp 17 12/25/22 0915  SpO2 98 % 12/25/22 0915  Vitals shown include unvalidated device data.  Last Pain:  Vitals:   12/25/22 0626  TempSrc: Oral  PainSc:          Complications: No notable events documented.

## 2022-12-25 NOTE — Anesthesia Procedure Notes (Signed)
Procedure Name: Intubation Date/Time: 12/25/2022 7:43 AM  Performed by: Victoriano Lain, CRNAPre-anesthesia Checklist: Patient identified, Emergency Drugs available, Suction available, Patient being monitored and Timeout performed Patient Re-evaluated:Patient Re-evaluated prior to induction Oxygen Delivery Method: Circle system utilized Preoxygenation: Pre-oxygenation with 100% oxygen Induction Type: IV induction Ventilation: Mask ventilation without difficulty Laryngoscope Size: Mac and 4 Grade View: Grade I Tube type: Oral Tube size: 7.5 mm Number of attempts: 1 Airway Equipment and Method: Stylet Placement Confirmation: ETT inserted through vocal cords under direct vision, positive ETCO2 and breath sounds checked- equal and bilateral Secured at: 22 cm Tube secured with: Tape Dental Injury: Teeth and Oropharynx as per pre-operative assessment

## 2022-12-25 NOTE — Op Note (Signed)
Operative Note  Date of operation: 12/25/2022 Preoperative diagnosis: Right hip primary osteoarthritis Postoperative diagnosis: Same  Procedure: Right direct anterior total hip arthroplasty  Implant Name Type Inv. Item Serial No. Manufacturer Lot No. LRB No. Used Action  LINER NEUTRAL [id number] PLUS 4 - [id number] Liner LINER NEUTRAL 52X36MM PLUS 4  [employer] [id number] Right 1 Implanted  PIN SECTOR W/GRIP ACE CUP 52MM - [id number] Hips PIN SECTOR W/GRIP ACE CUP 52MM  DEPUY ORTH[employer] [id number] Right 1 Implanted  FEM STEM 12/14 TAPER SZ 4 HIP - [id number] Orthopedic Implant FEM STEM 12/14 TAPER SZ 4 HIP  DEPUY ORTH[employer] [id number] Right 1 Implanted  HEAD CERAMIC 36 PLUS5 - [id number] Hips HEAD CERAMIC 36 PLUS5  DEPUY ORTHOPAEDICS [id number] Right 1 Implanted   Surgeon: Lind Guest. Ninfa Linden, MD Assistant: Benita Stabile, PA-C  Anesthesia: #1 attempted spinal, #2 General EBL: 250 to 300 cc Antibiotics: 2 g IV Ancef Complications: None  Indications: The patient is a 62 year old female that I been following for several years with severe debilitating arthritis involving her right hip.  She has tried and failed conservative treatment for multiple years now and is at the point she wishes to proceed with a hip replacement on the right side.  Her right hip pain is daily and is detrimentally affecting her mobility, her quality of life and her actives daily living.  Her x-rays show bone-on-bone wear of that hip.  She understands with surgery there is a risk of acute blood loss anemia, nerve or vessel injury, fracture, infection, DVT, implant failure, dislocation, leg length differences and wound healing issues.  She understands her goals are hopefully decrease pain, improve mobility, and improve quality of life.  Procedure description: After informed consent was obtained and the appropriate right hip was marked, the patient is brought to the operating room and set up on her stretcher where spinal  anesthesia was attempted but was not successful.  She was then laid in supine position on stretcher.  General anesthesia was obtained.  I was able to assess her leg length since she is definitely shorter on the right side than left.  Traction boots were placed on both her feet and she was placed supine on the Hana fracture table with a perineal post in place in both legs and inline skeletal traction devices but no traction applied.  We then assessed her pelvis and right hip radiographically to get a good starting point.  Her right hip was then prepped and draped with DuraPrep and sterile drapes.  A timeout was called and she identified as correct patient correct right hip.  An incision was then made just inferior and posterior to the ASIS and carried slightly obliquely down the leg.  Dissection was carried down the tensor fascia lata muscle and tensor fascia was then divided longitudinally to proceed with a direct interposed the hip.  Circumflex vessels were identified and cauterized and hip capsule identified opened up in L-type format.  Significant arthritis was found around the lateral femoral head neck.  Cobra retractors were placed around the medial and lateral femoral neck and a femoral neck cut was made just proximal to the lesser trochanter with an oscillating saw and completed with an osteotome.  A corkscrew guide was placed in the femoral head and the femoral head was removed in its entirety and there is a wide area devoid of cartilage.  A bent Hohmann was then placed over the medial acetabular rim.  Remnants of the acetabular labrum and other debris were removed.  Reaming was then initiated under direct visualization and direct fluoroscopy from a size 48 reamer and stepwise increments going up to a size 51 reamer with all reamers again placed under direct visualization and the last reamer was placed in direct fluoroscopy as well in order to obtain the depth of reaming, the inclination and anteversion.   The real DePuy sector GRIPTION acetabular component 52 was then placed without difficulty and a 36+4 polythene liner was placed within that size 52 acetabular component.  Attention was then turned to the femur.  With the right leg externally rotated to 120 degrees, extended and adducted, a Mueller retractor was placed medially and a Hohmann retractor was placed behind the greater trochanter.  The lateral joint capsule was released and a box cutting ostium was used into the femoral canal.  Broaching was then initiated from a size 0 broach going and stepwise increments up to a size 4 broach.  With a size 4 broach in place we trialed a standard offset femoral neck and a 3 6+1.5 trial hip ball.  This was reduced in the acetabulum and based on clinical assessment and radiographic assessment we needed more offset and slightly more leg length.  Of note she did short of shorter.  We dislocated the hip and remove the trial components.  We then placed the real Actis femoral component with high offset size 4 and we went with a 36+5 ceramic hip ball.  This reduced and acetabulum and we are pleased with range of motion, offset, stability and leg length assessed clinically and radiographically.  The soft tissue was then irrigated with normal saline solution.  The joint capsule was closed interrupted #1 Ethibond suture followed by #1 Vicryl to close the tensor fascia.  0 Vicryl was used to close the deep tissue and 2-0 Vicryl was used to close subcutaneous tissue.  The skin was closed with staples.  An Aquacel dressing was applied.  She was taken off the Hana table, awakened, extubated and taken recovery in stable addition with all final counts being correct and no complications noted.  Benita Stabile, PA-C did assist during the entire case from beginning to end and his assistance was medically necessary and crucial for soft tissue retraction and management, helping guide implant placement and a layered closure of the wound.

## 2022-12-25 NOTE — Interval H&P Note (Signed)
History and Physical Interval Note: The patient understands that she is here today for right hip replacement to treat her severe right hip arthritis.  There has been no acute or interval change in her medical status.  See H&P.  The risks and benefits of surgery been discussed in detail and informed consent is obtained.  The right operative hip has been marked.  12/25/2022 7:20 AM  Sheila Vincent  has presented today for surgery, with the diagnosis of OSTEOARTHRITIS / [place].  The various methods of treatment have been discussed with the patient and family. After consideration of risks, benefits and other options for treatment, the patient has consented to  Procedure(s): [place] (Right) as a surgical intervention.  The patient's history has been reviewed, patient examined, no change in status, stable for surgery.  I have reviewed the patient's chart and labs.  Questions were answered to the patient's satisfaction.     Mcarthur Rossetti

## 2022-12-25 NOTE — Plan of Care (Signed)
  Problem: Education: Goal: Knowledge of the prescribed therapeutic regimen will improve Outcome: Progressing   Problem: Activity: Goal: Ability to avoid complications of mobility impairment will improve Outcome: Progressing   Problem: Pain Management: Goal: Pain level will decrease with appropriate interventions Outcome: Progressing   Problem: Education: Goal: Knowledge of General Education information will improve Description: Including pain rating scale, medication(s)/side effects and non-pharmacologic comfort measures Outcome: Progressing   Problem: Activity: Goal: Risk for activity intolerance will decrease Outcome: Progressing   Problem: Nutrition: Goal: Adequate nutrition will be maintained Outcome: Progressing   Problem: Elimination: Goal: Will not experience complications related to bowel motility Outcome: Progressing   Problem: Pain Managment: Goal: General experience of comfort will improve Outcome: Progressing   Problem: Safety: Goal: Ability to remain free from injury will improve Outcome: Progressing

## 2022-12-26 ENCOUNTER — Encounter (HOSPITAL_COMMUNITY): Payer: Self-pay | Admitting: Orthopaedic Surgery

## 2022-12-26 DIAGNOSIS — Z8616 Personal history of COVID-19: Secondary | ICD-10-CM | POA: Diagnosis not present

## 2022-12-26 DIAGNOSIS — Z882 Allergy status to sulfonamides status: Secondary | ICD-10-CM | POA: Diagnosis not present

## 2022-12-26 DIAGNOSIS — I10 Essential (primary) hypertension: Secondary | ICD-10-CM | POA: Diagnosis present

## 2022-12-26 DIAGNOSIS — Z79899 Other long term (current) drug therapy: Secondary | ICD-10-CM | POA: Diagnosis not present

## 2022-12-26 DIAGNOSIS — Z841 Family history of disorders of kidney and ureter: Secondary | ICD-10-CM | POA: Diagnosis not present

## 2022-12-26 DIAGNOSIS — Z820 Family history of epilepsy and other diseases of the nervous system: Secondary | ICD-10-CM | POA: Diagnosis not present

## 2022-12-26 DIAGNOSIS — E119 Type 2 diabetes mellitus without complications: Secondary | ICD-10-CM | POA: Diagnosis present

## 2022-12-26 DIAGNOSIS — H9191 Unspecified hearing loss, right ear: Secondary | ICD-10-CM | POA: Diagnosis present

## 2022-12-26 DIAGNOSIS — Z87442 Personal history of urinary calculi: Secondary | ICD-10-CM | POA: Diagnosis not present

## 2022-12-26 DIAGNOSIS — D62 Acute posthemorrhagic anemia: Secondary | ICD-10-CM | POA: Diagnosis not present

## 2022-12-26 DIAGNOSIS — Z90710 Acquired absence of both cervix and uterus: Secondary | ICD-10-CM | POA: Diagnosis not present

## 2022-12-26 DIAGNOSIS — Z881 Allergy status to other antibiotic agents status: Secondary | ICD-10-CM | POA: Diagnosis not present

## 2022-12-26 DIAGNOSIS — J4540 Moderate persistent asthma, uncomplicated: Secondary | ICD-10-CM | POA: Diagnosis present

## 2022-12-26 DIAGNOSIS — M1611 Unilateral primary osteoarthritis, right hip: Secondary | ICD-10-CM | POA: Diagnosis present

## 2022-12-26 DIAGNOSIS — Z825 Family history of asthma and other chronic lower respiratory diseases: Secondary | ICD-10-CM | POA: Diagnosis not present

## 2022-12-26 DIAGNOSIS — Z833 Family history of diabetes mellitus: Secondary | ICD-10-CM | POA: Diagnosis not present

## 2022-12-26 LAB — CBC
HCT: 30.1 % — ABNORMAL LOW (ref 36.0–46.0)
Hemoglobin: 9.4 g/dL — ABNORMAL LOW (ref 12.0–15.0)
MCH: 26.4 pg (ref 26.0–34.0)
MCHC: 31.2 g/dL (ref 30.0–36.0)
MCV: 84.6 fL (ref 80.0–100.0)
Platelets: 330 10*3/uL (ref 150–400)
RBC: 3.56 MIL/uL — ABNORMAL LOW (ref 3.87–5.11)
RDW: 13.5 % (ref 11.5–15.5)
WBC: 15 10*3/uL — ABNORMAL HIGH (ref 4.0–10.5)
nRBC: 0 % (ref 0.0–0.2)

## 2022-12-26 LAB — BASIC METABOLIC PANEL
Anion gap: 7 (ref 5–15)
BUN: 9 mg/dL (ref 8–23)
CO2: 22 mmol/L (ref 22–32)
Calcium: 8.3 mg/dL — ABNORMAL LOW (ref 8.9–10.3)
Chloride: 107 mmol/L (ref 98–111)
Creatinine, Ser: 0.76 mg/dL (ref 0.44–1.00)
GFR, Estimated: >60 mL/min (ref >60)
Glucose, Bld: 134 mg/dL — ABNORMAL HIGH (ref 70–99)
Potassium: 4.1 mmol/L (ref 3.5–5.1)
Sodium: 136 mmol/L (ref 135–145)

## 2022-12-26 MED ORDER — SODIUM CHLORIDE 0.9 % IV BOLUS
500.0000 mL | Freq: Once | INTRAVENOUS | Status: AC
  Administered 2022-12-26: 500 mL via INTRAVENOUS

## 2022-12-26 NOTE — Progress Notes (Signed)
Physical Therapy Treatment Patient Details Name: Sheila Vincent MRN: [id number] DOB: 10-02-1961 Today's Date: 12/26/2022   History of Present Illness Pt is a 62 year old female s/p Right THA on 12/25/22    PT Comments    Pt in bed on arrival and reports having low BP.  Pt currently receiving bolus.  Pt agreeable to LE exercises so assisted with exercises in supine.  Pt declined OOB activity at this time due to pain finally being better (had been premedicated, worries about increase in pain and unable to get more pain meds if OOB) as well as low BP.  Pt encouraged to ambulate to bathroom with staff later today (if not symptomatic).      Recommendations for follow up therapy are one component of a multi-disciplinary discharge planning process, led by the attending physician.  Recommendations may be updated based on patient status, additional functional criteria and insurance authorization.  Follow Up Recommendations  Follow physician's recommendations for discharge plan and follow up therapies     Assistance Recommended at Discharge PRN  Patient can return home with the following Assist for transportation;Help with stairs or ramp for entrance   Equipment Recommendations  Rolling walker (2 wheels)    Recommendations for Other Services       Precautions / Restrictions Precautions Precautions: Fall Restrictions Other Position/Activity Restrictions: WBAT     Mobility  Bed Mobility  Transfers            Ambulation/Gait   Stairs             Wheelchair Mobility    Modified Rankin (Stroke Patients Only)       Balance                                            Cognition Arousal/Alertness: Awake/alert Behavior During Therapy: WFL for tasks assessed/performed Overall Cognitive Status: Within Functional Limits for tasks assessed                                          Exercises Total Joint Exercises Ankle Circles/Pumps:  AROM, Both, 10 reps Quad Sets: AROM, Right, 10 reps Short Arc Quad: AROM, Right, 10 reps Heel Slides: AAROM, Right, 10 reps Hip ABduction/ADduction: AAROM, Right, 10 reps    General Comments        Pertinent Vitals/Pain Pain Assessment Pain Assessment: 0-10 Pain Score: 6  Pain Location: right hip Pain Descriptors / Indicators: Sore, Grimacing, Operative site guarding Pain Intervention(s): Premedicated before session, Repositioned, Monitored during session, Ice applied    Home Living                          Prior Function            PT Goals (current goals can now be found in the care plan section) Progress towards PT goals: Progressing toward goals    Frequency    7X/week      PT Plan Current plan remains appropriate    Co-evaluation              AM-PAC PT "6 Clicks" Mobility   Outcome Measure  Help needed turning from your back to your side while in a flat bed without using bedrails?: A Little Help  needed moving from lying on your back to sitting on the side of a flat bed without using bedrails?: A Little Help needed moving to and from a bed to a chair (including a wheelchair)?: A Little Help needed standing up from a chair using your arms (e.g., wheelchair or bedside chair)?: A Little Help needed to walk in hospital room?: A Little Help needed climbing 3-5 steps with a railing? : A Little 6 Click Score: 18    End of Session Equipment Utilized During Treatment: Gait belt Activity Tolerance: Patient tolerated treatment well Patient left: in bed;with bed alarm set;with call bell/phone within reach Nurse Communication: Mobility status PT Visit Diagnosis: Difficulty in walking, not elsewhere classified (R26.2)     Time: [id number] PT Time Calculation (min) (ACUTE ONLY): 22 min  Charges:   $Therapeutic Exercise: 8-22 mins                    Jannette Spanner PT, DPT Physical Therapist Acute Rehabilitation Services Preferred contact method: Secure  Chat Weekend Pager Only: [phone] Office: [place] 12/26/2022, 3:17 PM

## 2022-12-26 NOTE — Progress Notes (Signed)
Physical Therapy Treatment Patient Details Name: Sheila Vincent MRN: [id number] DOB: 04-07-1961 Today's Date: 12/26/2022   History of Present Illness Pt is a 62 year old female s/p Right THA on 12/25/22    PT Comments    Pt agreeable to mobilize however only tolerated ambulating around room to recliner due to pain and nausea (RN notified).   Recommendations for follow up therapy are one component of a multi-disciplinary discharge planning process, led by the attending physician.  Recommendations may be updated based on patient status, additional functional criteria and insurance authorization.  Follow Up Recommendations  Follow physician's recommendations for discharge plan and follow up therapies     Assistance Recommended at Discharge PRN  Patient can return home with the following Assist for transportation;Help with stairs or ramp for entrance   Equipment Recommendations  Rolling walker (2 wheels)    Recommendations for Other Services       Precautions / Restrictions Precautions Precautions: Fall Restrictions Other Position/Activity Restrictions: WBAT     Mobility  Bed Mobility Overal bed mobility: Needs Assistance Bed Mobility: Supine to Sit     Supine to sit: Min guard, HOB elevated     General bed mobility comments: verbal cues for self assist, utilized gait belt to assist Rt LE    Transfers Overall transfer level: Needs assistance Equipment used: Rolling walker (2 wheels) Transfers: Sit to/from Stand Sit to Stand: Min assist           General transfer comment: verbal cues for UE and LE positioning, assist to rise    Ambulation/Gait Ambulation/Gait assistance: Min guard Gait Distance (Feet): 12 Feet Assistive device: Rolling walker (2 wheels) Gait Pattern/deviations: Step-to pattern, Decreased stance time - right, Antalgic Gait velocity: decr     General Gait Details: verbal cues for sequence, RW positioning, step length   Stairs              Wheelchair Mobility    Modified Rankin (Stroke Patients Only)       Balance                                            Cognition Arousal/Alertness: Awake/alert Behavior During Therapy: WFL for tasks assessed/performed Overall Cognitive Status: Within Functional Limits for tasks assessed                                          Exercises      General Comments        Pertinent Vitals/Pain Pain Assessment Pain Assessment: 0-10 Pain Score: 8  Pain Location: right hip Pain Descriptors / Indicators: Sore, Grimacing, Operative site guarding Pain Intervention(s): Repositioned, Premedicated before session, Monitored during session    Home Living                          Prior Function            PT Goals (current goals can now be found in the care plan section) Progress towards PT goals: Progressing toward goals    Frequency    7X/week      PT Plan Current plan remains appropriate    Co-evaluation              AM-PAC PT "6 Clicks"  Mobility   Outcome Measure  Help needed turning from your back to your side while in a flat bed without using bedrails?: A Little Help needed moving from lying on your back to sitting on the side of a flat bed without using bedrails?: A Little Help needed moving to and from a bed to a chair (including a wheelchair)?: A Little Help needed standing up from a chair using your arms (e.g., wheelchair or bedside chair)?: A Little Help needed to walk in hospital room?: A Little Help needed climbing 3-5 steps with a railing? : A Little 6 Click Score: 18    End of Session Equipment Utilized During Treatment: Gait belt Activity Tolerance: Patient tolerated treatment well Patient left: in chair;with call bell/phone within reach;with chair alarm set Nurse Communication: Mobility status PT Visit Diagnosis: Difficulty in walking, not elsewhere classified (R26.2)     Time:  [id number] PT Time Calculation (min) (ACUTE ONLY): 14 min  Charges:  $Gait Training: 8-22 mins                     Jannette Spanner PT, DPT Physical Therapist Acute Rehabilitation Services Preferred contact method: Secure Chat Weekend Pager Only: [phone] Office: [phone]    [name] 12/26/2022, 3:15 PM

## 2022-12-26 NOTE — Discharge Instructions (Signed)

## 2022-12-26 NOTE — Plan of Care (Signed)

## 2022-12-26 NOTE — Progress Notes (Signed)
Subjective: 1 Day Post-Op Procedure(s) (LRB): RIGHT HIP ARTHROPLASTY ANTERIOR APPROACH (Right) Patient reports pain as severe.  Also with soft BP.  Acute blood loss anemia from surgery.  Objective: Vital signs in last 24 hours: Temp:  [97.5 F (36.4 C)-98.6 F (37 C)] 98 F (36.7 C) (02/23 1206) Pulse Rate:  [83-88] 83 (02/23 1323) Resp:  [15-18] 18 (02/23 1323) BP: (92-118)/(51-82) 92/54 (02/23 1323) SpO2:  [99 %-100 %] 99 % (02/23 1206)  Intake/Output from previous day: 02/22 0701 - 02/23 0700 In: 3189.1 [P.O.:60; I.V.:2879.1; IV Piggyback:250] Out: 950 [Urine:300; Emesis/NG output:400; Blood:250] Intake/Output this shift: Total I/O In: 480 [P.O.:480] Out: -   Recent Labs    12/26/22 0339  HGB 9.4*   Recent Labs    12/26/22 0339  WBC 15.0*  RBC 3.56*  HCT 30.1*  PLT 330   Recent Labs    12/26/22 0339  NA 136  K 4.1  CL 107  CO2 22  BUN 9  CREATININE 0.76  GLUCOSE 134*  CALCIUM 8.3*   No results for input(s): "LABPT", "INR" in the last 72 hours.  Sensation intact distally Intact pulses distally Dorsiflexion/Plantar flexion intact Incision: scant drainage   Assessment/Plan: 1 Day Post-Op Procedure(s) (LRB): RIGHT HIP ARTHROPLASTY ANTERIOR APPROACH (Right) Up with therapy Plan for discharge tomorrow Discharge home with home health  Bolus fluid this afternoon.  Pain meds and acute blood lose anemia contributing to low BP.  Will need to check CBC in am and hold on discharge today.    Mcarthur Rossetti 12/26/2022, 2:17 PM

## 2022-12-26 NOTE — TOC Transition Note (Signed)
Transition of Care [facility]) - CM/SW Discharge Note  Patient Details  Name: Sheila Vincent MRN: [id number] Date of Birth: 04-03-1961  [facility] CM/SW Contact:  Sherie Don, LCSW Phone Number: 12/26/2022, 9:28 AM  Clinical Narrative: Patient is expected to discharge home after working with PT. CSW met with patient to confirm discharge plan and needs. Patient will go home with HHPT, which was prearranged with [place]. Patient will need a rolling walker, which [employer] delivered to patient's room. TOC signing off.    Final next level of care: Home w Home Health Services Barriers to Discharge: No Barriers Identified  Patient Goals and CMS Choice CMS Medicare.gov Compare Post Acute Care list provided to:: Patient Choice offered to / list presented to : Patient  Discharge Plan and Services Additional resources added to the After Visit Summary for          DME Arranged: Walker rolling DME Agency: [employer] Date DME Agency Contacted: 12/26/22 Representative spoke with at DME Agency: [place]: PT [place]: [employer] Representative spoke with at [place] in orthopedist's office  Social Determinants of Health ([place]) Interventions SDOH Screenings   Food Insecurity: No Food Insecurity (12/25/2022)  Housing: Low Risk  (12/25/2022)  Transportation Needs: No Transportation Needs (12/25/2022)  Utilities: Not At Risk (12/25/2022)  Tobacco Use: Low Risk  (12/25/2022)   Readmission Risk Interventions     No data to display

## 2022-12-27 LAB — CBC
HCT: 29.5 % — ABNORMAL LOW (ref 36.0–46.0)
Hemoglobin: 9 g/dL — ABNORMAL LOW (ref 12.0–15.0)
MCH: 26.4 pg (ref 26.0–34.0)
MCHC: 30.5 g/dL (ref 30.0–36.0)
MCV: 86.5 fL (ref 80.0–100.0)
Platelets: 316 10*3/uL (ref 150–400)
RBC: 3.41 MIL/uL — ABNORMAL LOW (ref 3.87–5.11)
RDW: 14 % (ref 11.5–15.5)
WBC: 13.8 10*3/uL — ABNORMAL HIGH (ref 4.0–10.5)
nRBC: 0 % (ref 0.0–0.2)

## 2022-12-27 MED ORDER — OXYCODONE HCL 5 MG PO TABS: 5.0000 mg | ORAL_TABLET | ORAL | 0 refills | Status: DC | PRN

## 2022-12-27 MED ORDER — ASPIRIN 81 MG PO CHEW: 81.0000 mg | CHEWABLE_TABLET | Freq: Two times a day (BID) | ORAL | 0 refills | Status: DC

## 2022-12-27 MED ORDER — METHOCARBAMOL 500 MG PO TABS: 500.0000 mg | ORAL_TABLET | Freq: Four times a day (QID) | ORAL | 0 refills | Status: DC | PRN

## 2022-12-27 NOTE — Progress Notes (Signed)
Physical Therapy Treatment Patient Details Name: Sheila Vincent MRN: [id number] DOB: [birth year] Today's Date: 12/27/2022   History of Present Illness Pt is a 62 year old female s/p Right THA on 12/25/22    PT Comments    Pt ambulated in hallway, practiced stairs and performed LE exercises.  Pt provided with HEP handout and reviewed.  Pt plans to have her friend assist her home.  Pt ready for d/c home today.    Recommendations for follow up therapy are one component of a multi-disciplinary discharge planning process, led by the attending physician.  Recommendations may be updated based on patient status, additional functional criteria and insurance authorization.  Follow Up Recommendations  Follow physician's recommendations for discharge plan and follow up therapies     Assistance Recommended at Discharge PRN  Patient can return home with the following Assist for transportation;Help with stairs or ramp for entrance   Equipment Recommendations  Rolling walker (2 wheels)    Recommendations for Other Services       Precautions / Restrictions Precautions Precautions: Fall Restrictions Other Position/Activity Restrictions: WBAT     Mobility  Bed Mobility               General bed mobility comments: pt in recliner    Transfers Overall transfer level: Needs assistance Equipment used: Rolling walker (2 wheels) Transfers: Sit to/from Stand Sit to Stand: Min guard           General transfer comment: verbal cues for UE and LE positioning    Ambulation/Gait Ambulation/Gait assistance: Min guard Gait Distance (Feet): 120 Feet Assistive device: Rolling walker (2 wheels) Gait Pattern/deviations: Step-to pattern, Decreased stance time - right, Antalgic, Step-through pattern Gait velocity: decr     General Gait Details: verbal cues for sequence, RW positioning, step length   Stairs Stairs: Yes Stairs assistance: Min guard Stair Management: Step to pattern,  Forwards, Two rails Number of Stairs: 3 General stair comments: verbal cues for safety, pt aware to have her friend bring up RW first and wait for friend to return, pt has bil reachable rails to hold, no physical assist required today, performed twice   Wheelchair Mobility    Modified Rankin (Stroke Patients Only)       Balance                                            Cognition Arousal/Alertness: Awake/alert Behavior During Therapy: WFL for tasks assessed/performed Overall Cognitive Status: Within Functional Limits for tasks assessed                                          Exercises Total Joint Exercises Ankle Circles/Pumps: AROM, Both, 10 reps Quad Sets: AROM, 10 reps, Both Heel Slides: AAROM, Right, 10 reps Hip ABduction/ADduction: AAROM, Right, 10 reps Long Arc Quad: AROM, Right, 10 reps, Seated    General Comments        Pertinent Vitals/Pain Pain Assessment Pain Assessment: 0-10 Pain Score: 4  Pain Location: right hip Pain Descriptors / Indicators: Sore, Grimacing, Operative site guarding Pain Intervention(s): Repositioned, Premedicated before session, Monitored during session    Home Living  Prior Function            PT Goals (current goals can now be found in the care plan section) Progress towards PT goals: Progressing toward goals    Frequency    7X/week      PT Plan Current plan remains appropriate    Co-evaluation              AM-PAC PT "6 Clicks" Mobility   Outcome Measure  Help needed turning from your back to your side while in a flat bed without using bedrails?: A Little Help needed moving from lying on your back to sitting on the side of a flat bed without using bedrails?: A Little Help needed moving to and from a bed to a chair (including a wheelchair)?: A Little Help needed standing up from a chair using your arms (e.g., wheelchair or bedside chair)?:  A Little Help needed to walk in hospital room?: A Little Help needed climbing 3-5 steps with a railing? : A Little 6 Click Score: 18    End of Session Equipment Utilized During Treatment: Gait belt Activity Tolerance: Patient tolerated treatment well Patient left: with call bell/phone within reach;in chair;with chair alarm set Nurse Communication: Mobility status PT Visit Diagnosis: Difficulty in walking, not elsewhere classified (R26.2)     Time: [id number] PT Time Calculation (min) (ACUTE ONLY): 25 min  Charges:  $Gait Training: 8-22 mins $Therapeutic Exercise: 8-22 mins                     Jannette Spanner PT, DPT Physical Therapist Acute Rehabilitation Services Preferred contact method: Secure Chat Weekend Pager Only: [phone] Office: [phone]    [name] 12/27/2022, 11:29 AM

## 2022-12-27 NOTE — Progress Notes (Signed)
Subjective: 2 Days Post-Op Procedure(s) (LRB): RIGHT HIP ARTHROPLASTY ANTERIOR APPROACH (Right) Patient reports pain as moderate.  BP better.  Mobilizing much better.  Objective: Vital signs in last 24 hours: Temp:  [97.8 F (36.6 C)-98.6 F (37 C)] 97.8 F (36.6 C) (02/24 0800) Pulse Rate:  [83-100] 90 (02/24 0800) Resp:  [15-18] 15 (02/24 0827) BP: (92-121)/(51-71) 121/57 (02/24 0800) SpO2:  [90 %-100 %] 92 % (02/24 0827)  Intake/Output from previous day: 02/23 0701 - 02/24 0700 In: 2655.2 [P.O.:1140; I.V.:1515.2] Out: 1000 [Urine:1000] Intake/Output this shift: No intake/output data recorded.  Recent Labs    12/26/22 0339 12/27/22 0818  HGB 9.4* 9.0*   Recent Labs    12/26/22 0339 12/27/22 0818  WBC 15.0* 13.8*  RBC 3.56* 3.41*  HCT 30.1* 29.5*  PLT 330 316   Recent Labs    12/26/22 0339  NA 136  K 4.1  CL 107  CO2 22  BUN 9  CREATININE 0.76  GLUCOSE 134*  CALCIUM 8.3*   No results for input(s): "LABPT", "INR" in the last 72 hours.  Sensation intact distally Intact pulses distally Dorsiflexion/Plantar flexion intact Incision: dressing C/D/I   Assessment/Plan: 2 Days Post-Op Procedure(s) (LRB): RIGHT HIP ARTHROPLASTY ANTERIOR APPROACH (Right) Up with therapy Discharge home with home health      Mcarthur Rossetti 12/27/2022, 9:26 AM

## 2022-12-27 NOTE — Evaluation (Signed)
Occupational Therapy Evaluation Patient Details Name: Sheila Vincent MRN: [id number] DOB: September 19, 1961 Today's Date: 12/27/2022   History of Present Illness Pt is a 62 year old female s/p Right THA on 12/25/22   Clinical Impression   Sheila Vincent is a 57 old woman s/p hip surgery with decreased ROM and strength in RLE and pain. Overall patient modified independent with dressing except for difficulty with right shoe. Therapist instructed patient on compensatory strategies and provided her with a shoe horn. Instructed patient on how to compensate for all daily tasks. Highly recommended no tub transfers for now due to her reporting falling in the tub multiple times until she practices with [facility] therapist. Therapist also highly recommended a shower chair for safety. Overall patient doing well and her complaints are mostly in regard to her dry mouth. Patient verbalizes understanding of all instruction. No further OT needed.      Recommendations for follow up therapy are one component of a multi-disciplinary discharge planning process, led by the attending physician.  Recommendations may be updated based on patient status, additional functional criteria and insurance authorization.   Follow Up Recommendations  No OT follow up     Assistance Recommended at Discharge PRN  Patient can return home with the following Assistance with cooking/housework    Functional Status Assessment  Patient has had a recent decline in their functional status and demonstrates the ability to make significant improvements in function in a reasonable and predictable amount of time.  Equipment Recommendations  Tub/shower seat    Recommendations for Other Services       Precautions / Restrictions Precautions Precautions: Fall Restrictions Weight Bearing Restrictions: No Other Position/Activity Restrictions: WBAT      Mobility Bed Mobility               General bed mobility comments: pt in recliner     Transfers Overall transfer level: Needs assistance Equipment used: Rolling walker (2 wheels)   Sit to Stand: Min guard                  Balance Overall balance assessment: Mild deficits observed, not formally tested                                         ADL either performed or assessed with clinical judgement   ADL                                         General ADL Comments: Overall modified independent using walker. She was able to don clothing but had some difficulty with donning right shoe. Therapist provided patient with long handled shoe horn to assess with donning shoe. Patient and therapist discussed bathing at home. Due to type of tub and patient reporting multiple falls in tub in the past -  therapist recommended no showering until she practices with [facility] therapist and higly suggested a shower chair.     Vision Patient Visual Report: No change from baseline       Perception     Praxis      Pertinent Vitals/Pain Pain Assessment Pain Assessment: Faces Pain Location: right hip Pain Descriptors / Indicators: Sore, Grimacing, Operative site guarding Pain Intervention(s): Repositioned, Monitored during session     Hand Dominance Right   Extremity/Trunk Assessment  Upper Extremity Assessment Upper Extremity Assessment: Overall WFL for tasks assessed   Lower Extremity Assessment Lower Extremity Assessment: Defer to PT evaluation   Cervical / Trunk Assessment Cervical / Trunk Assessment: Normal   Communication Communication Communication: No difficulties   Cognition Arousal/Alertness: Awake/alert Behavior During Therapy: WFL for tasks assessed/performed Overall Cognitive Status: Within Functional Limits for tasks assessed                                       General Comments       Exercises     Shoulder Instructions      Home Living Family/patient expects to be discharged to:: Private  residence Living Arrangements: Alone Available Help at Discharge: Friend(s);Available PRN/intermittently Type of Home: House Home Access: Stairs to enter [employer] of Steps: 8-9 Entrance Stairs-Rails: Left;Right;Can reach both Home Layout: One level     Bathroom Shower/Tub: Tub only   Biochemist, clinical: Standard     Home Equipment: None          Prior Functioning/Environment Prior Level of Function : Independent/Modified Independent                        OT Problem List: Decreased strength;Decreased range of motion;Pain      OT Treatment/Interventions:      OT Goals(Current goals can be found in the care plan section) Acute Rehab OT Goals OT Goal Formulation: All assessment and education complete, DC therapy  OT Frequency:      Co-evaluation              AM-PAC OT "6 Clicks" Daily Activity     Outcome Measure Help from another person eating meals?: None Help from another person taking care of personal grooming?: None Help from another person toileting, which includes using toliet, bedpan, or urinal?: None Help from another person bathing (including washing, rinsing, drying)?: None Help from another person to put on and taking off regular upper body clothing?: None Help from another person to put on and taking off regular lower body clothing?: A Little 6 Click Score: 23   End of Session Equipment Utilized During Treatment: Rolling walker (2 wheels) Nurse Communication:  (requests something for dry mouth)  Activity Tolerance: Patient tolerated treatment well Patient left: in chair;with call bell/phone within reach;with chair alarm set  OT Visit Diagnosis: Pain                Time: 1132-1212 OT Time Calculation (min): 40 min Charges:  OT General Charges $OT Visit: 1 Visit OT Evaluation $OT Eval Low Complexity: 1 Low OT Treatments $Self Care/Home Management : 23-37 mins  Gustavo Lah, OTR/L Acute Care Rehab Services  Office  [phone]   Lenward Chancellor 12/27/2022, 12:36 PM

## 2022-12-27 NOTE — Plan of Care (Signed)
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
## 2023-01-08 ENCOUNTER — Encounter: Payer: Self-pay | Admitting: Orthopaedic Surgery

## 2023-01-08 ENCOUNTER — Ambulatory Visit (INDEPENDENT_AMBULATORY_CARE_PROVIDER_SITE_OTHER): Payer: 59 | Admitting: Orthopaedic Surgery

## 2023-01-08 DIAGNOSIS — Z96641 Presence of right artificial hip joint: Secondary | ICD-10-CM

## 2023-01-08 MED ORDER — METHOCARBAMOL 500 MG PO TABS: 500.0000 mg | ORAL_TABLET | Freq: Four times a day (QID) | ORAL | 1 refills | Status: DC | PRN

## 2023-01-08 NOTE — Progress Notes (Signed)
The patient is here for first postoperative visit status post a right total hip arthroplasty.  She is already ambulating with just a cane.  She reports good range of motion and strength.  She has been compliant with a baby aspirin twice daily.  She is asking for refill only of methocarbamol.  Her right hip incision looks good.  The staples are removed and Steri-Strips applied.  There is no complicating features that we can see thus far.  She will continue to increase her activities as comfort allows.  I did refill her methocarbamol.  Will see her back in a month to see how she is doing overall but no x-rays are needed.

## 2023-01-09 ENCOUNTER — Ambulatory Visit: Payer: Medicare Other | Admitting: Internal Medicine

## 2023-01-19 ENCOUNTER — Telehealth: Payer: Self-pay

## 2023-01-19 NOTE — Telephone Encounter (Signed)
Left message for pt to call back  °

## 2023-01-19 NOTE — Telephone Encounter (Signed)
-----   Message from Gillermina Hu, RN sent at 08/20/2022 12:37 PM EDT ----- Personal reminder sent 08/20/2022   Please let the patient know that her fecal calprotectin is still high - but is lower than earlier this year. I recommend that we repeat this in 6 months when she returns for follow-up. Thanks.   Pt need Follow Up appointment in April with Dr. Tarri Glenn

## 2023-01-20 NOTE — Telephone Encounter (Signed)
Left message for pt to call back  °

## 2023-01-21 ENCOUNTER — Ambulatory Visit (INDEPENDENT_AMBULATORY_CARE_PROVIDER_SITE_OTHER): Payer: 59

## 2023-01-21 DIAGNOSIS — J309 Allergic rhinitis, unspecified: Secondary | ICD-10-CM

## 2023-01-22 ENCOUNTER — Encounter: Payer: Medicare Other | Admitting: Orthopaedic Surgery

## 2023-01-22 NOTE — Telephone Encounter (Signed)
Left message for pt to call back  °

## 2023-01-23 NOTE — Telephone Encounter (Signed)
Left message for pt to call back  °

## 2023-01-27 NOTE — Telephone Encounter (Signed)
Unable to leave message for pt. Pt voice mailbox full Unable to reach pt after multiple attempts.  Letter was created and sent to pt via mail.

## 2023-02-05 ENCOUNTER — Encounter: Payer: Self-pay | Admitting: Orthopaedic Surgery

## 2023-02-05 ENCOUNTER — Ambulatory Visit (INDEPENDENT_AMBULATORY_CARE_PROVIDER_SITE_OTHER): Payer: 59 | Admitting: Orthopaedic Surgery

## 2023-02-05 DIAGNOSIS — Z96641 Presence of right artificial hip joint: Secondary | ICD-10-CM

## 2023-02-05 MED ORDER — HYDROCODONE-ACETAMINOPHEN 5-325 MG PO TABS: 1.0000 | ORAL_TABLET | Freq: Four times a day (QID) | ORAL | 0 refills | Status: DC | PRN

## 2023-02-05 NOTE — Progress Notes (Signed)
The patient is a 47 old female who is now 6 weeks status post a right total hip arthroplasty.  She says she is doing well and still has some soreness in her thigh but reports better range of motion and strength.  On exam her hip is still slightly stiff on that right hip exam but he is getting better.  Her mobility is improved.  She is not walking with an assistive device.  At this point the next time when he is he is not for 6 months.  She will slowly increase her activities as comfort allows.  Will try occasional hydrocodone for her for pain.  If there are issues between now and 6 months she knows to let us know.  At her next visit I would like a standing low AP pelvis and lateral of her right operative hip.

## 2023-02-17 ENCOUNTER — Other Ambulatory Visit: Payer: Self-pay | Admitting: Cardiology

## 2023-02-17 DIAGNOSIS — E781 Pure hyperglyceridemia: Secondary | ICD-10-CM

## 2023-02-20 ENCOUNTER — Ambulatory Visit (INDEPENDENT_AMBULATORY_CARE_PROVIDER_SITE_OTHER): Payer: 59

## 2023-02-20 DIAGNOSIS — J309 Allergic rhinitis, unspecified: Secondary | ICD-10-CM | POA: Diagnosis not present

## 2023-02-27 ENCOUNTER — Ambulatory Visit (HOSPITAL_COMMUNITY)
Admission: EM | Admit: 2023-02-27 | Discharge: 2023-02-27 | Disposition: A | Discharge status: Home / Self Care | Payer: 59 | Attending: Emergency Medicine | Admitting: Emergency Medicine

## 2023-02-27 ENCOUNTER — Encounter (HOSPITAL_COMMUNITY): Payer: Self-pay

## 2023-02-27 DIAGNOSIS — B370 Candidal stomatitis: Secondary | ICD-10-CM | POA: Diagnosis not present

## 2023-02-27 MED ORDER — NYSTATIN 100000 UNIT/ML MT SUSP: 500000.0000 [IU] | Freq: Four times a day (QID) | OROMUCOSAL | 0 refills | Status: AC
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
## 2023-02-27 NOTE — Discharge Instructions (Addendum)
Your symptoms are consistent with oral thrush.  Please use the nystatin mouthwash 4 times daily until 48 hours after your symptoms resolve.  The overgrowth of yeast most likely occurred due to your recent antibiotic use.  Please follow-up with your primary care within the next week to ensure symptom resolution.  Please return to clinic if you develop any shortness of breath, oral swelling, or no improvement in condition.

## 2023-02-27 NOTE — ED Triage Notes (Signed)
Pt presents to the office for tongue pain and swelling. Pt states he feels like she has cuts on her tongue x 1 week.

## 2023-03-05 ENCOUNTER — Ambulatory Visit: Payer: 59 | Admitting: Allergy & Immunology

## 2023-03-18 ENCOUNTER — Ambulatory Visit (INDEPENDENT_AMBULATORY_CARE_PROVIDER_SITE_OTHER): Payer: 59 | Admitting: *Deleted

## 2023-03-18 DIAGNOSIS — J309 Allergic rhinitis, unspecified: Secondary | ICD-10-CM | POA: Diagnosis not present

## 2023-03-19 ENCOUNTER — Encounter: Payer: Self-pay | Admitting: Allergy & Immunology

## 2023-03-19 ENCOUNTER — Ambulatory Visit (INDEPENDENT_AMBULATORY_CARE_PROVIDER_SITE_OTHER): Payer: 59 | Admitting: Allergy & Immunology

## 2023-03-19 ENCOUNTER — Other Ambulatory Visit: Payer: Self-pay

## 2023-03-19 VITALS — BP 132/70 | HR 84 | Temp 98.3°F | Wt 153.7 lb

## 2023-03-19 DIAGNOSIS — J4540 Moderate persistent asthma, uncomplicated: Secondary | ICD-10-CM | POA: Diagnosis not present

## 2023-03-19 DIAGNOSIS — B999 Unspecified infectious disease: Secondary | ICD-10-CM | POA: Diagnosis not present

## 2023-03-19 DIAGNOSIS — J3089 Other allergic rhinitis: Secondary | ICD-10-CM

## 2023-03-19 DIAGNOSIS — J302 Other seasonal allergic rhinitis: Secondary | ICD-10-CM

## 2023-03-19 DIAGNOSIS — K219 Gastro-esophageal reflux disease without esophagitis: Secondary | ICD-10-CM
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
## 2023-03-19 NOTE — Patient Instructions (Addendum)
1. Moderate persistent asthma, uncomplicated - Spirometry (lung testing) looks awesome today!  - Daily controller medication(s): Singulair (montelukast) 10mg  daily - Prior to physical activity: albuterol 2 puffs 10-15 minutes before physical activity. - Rescue medications: albuterol 4 puffs every 4-6 hours as needed - Changes during respiratory infections or worsening symptoms: Add on Symbicort to 2 puffs twice daily for TWO WEEKS. - Asthma control goals:  * Full participation in all desired activities (may need albuterol before activity) * Albuterol use two time or less a week on average (not counting use with activity) * Cough interfering with sleep two time or less a month * Oral steroids no more than once a year * No hospitalizations  2. Perennial and seasonal allergic rhinitis - Continue with allergy shots at the same schedule. - Continue with azelastine nasal spray one spray per nostril up to twice daily as needed. - Continue with your cetirizine and the montelukast.  - Let's increase cetirizine to TWICE daily for a couple of weeks.  - Get a humidifier to use at night.  3. Return in about 6 months (around 09/19/2023).   ORTHO APPT: 05/11/2023 1:30 PM    Please inform us of any Emergency Department visits, hospitalizations, or changes in symptoms. Call us before going to the ED for breathing or allergy symptoms since we might be able to fit you in for a sick visit. Feel free to contact us anytime with any questions, problems, or concerns.  It was a pleasure to see you again today!  Websites that have reliable patient information: 1. American Academy of Asthma, Allergy, and Immunology: www.aaaai.org 2. Food Allergy Research and Education (FARE): foodallergy.org 3. Mothers of Asthmatics: http://www.asthmacommunitynetwork.org 4. American College of Allergy, Asthma, and Immunology: www.acaai.org   COVID-19 Vaccine Information can be found at:  [email or web address] For questions related to vaccine distribution or appointments, please email [email or web address] or call [phone].   We realize that you might be concerned about having an allergic reaction to the COVID19 vaccines. To help with that concern, WE ARE OFFERING THE COVID19 VACCINES IN OUR OFFICE! Ask the front desk for dates!     "Like" [place] on Facebook and Instagram for our latest updates!      A healthy democracy works best when [employer] participate! Make sure you are registered to vote! If you have moved or changed any of your contact information, you will need to get this updated before voting!  In some cases, you MAY be able to register to vote online: [email or web address]

## 2023-03-23 MED ORDER — CETIRIZINE HCL 10 MG PO TABS: 10.0000 mg | ORAL_TABLET | Freq: Two times a day (BID) | ORAL | 1 refills | Status: DC

## 2023-03-23 MED ORDER — ALBUTEROL SULFATE HFA 108 (90 BASE) MCG/ACT IN AERS: INHALATION_SPRAY | RESPIRATORY_TRACT | 1 refills | Status: DC

## 2023-03-23 MED ORDER — MONTELUKAST SODIUM 10 MG PO TABS: 10.0000 mg | ORAL_TABLET | Freq: Every day | ORAL | 1 refills | Status: DC

## 2023-03-23 MED ORDER — AZELASTINE HCL 0.1 % NA SOLN: NASAL | 1 refills | Status: DC

## 2023-03-24 DIAGNOSIS — J3081 Allergic rhinitis due to animal (cat) (dog) hair and dander: Secondary | ICD-10-CM | POA: Diagnosis not present

## 2023-03-24 NOTE — Progress Notes (Signed)
VIALS EXP 03-23-24 

## 2023-03-25 DIAGNOSIS — J302 Other seasonal allergic rhinitis: Secondary | ICD-10-CM | POA: Diagnosis not present

## 2023-04-02 ENCOUNTER — Ambulatory Visit: Payer: 59 | Admitting: Gastroenterology

## 2023-04-13 ENCOUNTER — Ambulatory Visit (INDEPENDENT_AMBULATORY_CARE_PROVIDER_SITE_OTHER): Payer: 59 | Admitting: *Deleted

## 2023-04-13 DIAGNOSIS — J309 Allergic rhinitis, unspecified: Secondary | ICD-10-CM

## 2023-05-11 ENCOUNTER — Ambulatory Visit (INDEPENDENT_AMBULATORY_CARE_PROVIDER_SITE_OTHER): Payer: 59 | Admitting: Orthopaedic Surgery

## 2023-05-11 ENCOUNTER — Other Ambulatory Visit (INDEPENDENT_AMBULATORY_CARE_PROVIDER_SITE_OTHER): Payer: 59

## 2023-05-11 ENCOUNTER — Encounter: Payer: Self-pay | Admitting: Orthopaedic Surgery

## 2023-05-11 DIAGNOSIS — Z96641 Presence of right artificial hip joint: Secondary | ICD-10-CM

## 2023-05-11 NOTE — Progress Notes (Signed)
The patient comes in today at 5 months status post a right total hip arthroplasty.  She is only 62 years old and very active.  She says that she is doing well.  She has a little bit of uncomfortable feeling after standing for long periods of time but overall is doing well.  She is walking without a limp or an assistive device.  On exam her right operative hip moves smoothly and fluidly.  Her left hip also move smoothly and fluidly.  Her leg lengths are equal.  Standing AP pelvis and lateral of her right operative hip shows a well-seated total hip arthroplasty with no complicating features.  At this point since she is doing so well follow-up for the hip can be as needed.  If she does develop any issues at all she knows to let us know.

## 2023-05-13 ENCOUNTER — Ambulatory Visit (INDEPENDENT_AMBULATORY_CARE_PROVIDER_SITE_OTHER): Payer: 59

## 2023-05-13 DIAGNOSIS — J309 Allergic rhinitis, unspecified: Secondary | ICD-10-CM

## 2023-05-22 ENCOUNTER — Ambulatory Visit (HOSPITAL_COMMUNITY)
Admission: EM | Admit: 2023-05-22 | Discharge: 2023-05-22 | Discharge status: Against Medical Advice | Payer: 59 | Source: Home / Self Care

## 2023-05-22 ENCOUNTER — Ambulatory Visit (HOSPITAL_COMMUNITY): Payer: 59

## 2023-05-22 ENCOUNTER — Encounter (HOSPITAL_COMMUNITY): Payer: Self-pay

## 2023-05-22 DIAGNOSIS — R051 Acute cough: Secondary | ICD-10-CM
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
## 2023-05-22 NOTE — ED Triage Notes (Signed)
Patient here today with c/o cough, wheeze, SOB, congestion, and runny nose X 1 week. She has been taking Azithromycin for 4 days from PCP but not helping. It seems to be worsening. No fever. Has h/o asthma.

## 2023-06-12 ENCOUNTER — Ambulatory Visit (INDEPENDENT_AMBULATORY_CARE_PROVIDER_SITE_OTHER): Payer: 59

## 2023-06-12 DIAGNOSIS — J309 Allergic rhinitis, unspecified: Secondary | ICD-10-CM | POA: Diagnosis not present

## 2023-06-15 ENCOUNTER — Other Ambulatory Visit (INDEPENDENT_AMBULATORY_CARE_PROVIDER_SITE_OTHER): Payer: 59

## 2023-06-15 ENCOUNTER — Ambulatory Visit: Payer: 59 | Admitting: Orthopedic Surgery

## 2023-06-15 DIAGNOSIS — M542 Cervicalgia: Secondary | ICD-10-CM | POA: Diagnosis not present

## 2023-06-15 MED ORDER — CYCLOBENZAPRINE HCL 10 MG PO TABS: 10.0000 mg | ORAL_TABLET | Freq: Three times a day (TID) | ORAL | 0 refills | Status: DC | PRN

## 2023-06-15 NOTE — Progress Notes (Signed)
Orthopedic Spine Surgery Office Note  Assessment: Patient is a 62 y.o. female with one month of neck pain. It is on the right side. Possibly from facet arthropathy at C3/4.    Plan: -Explained that initially conservative treatment is tried as a significant number of patients may experience relief with these treatment modalities. Discussed that the conservative treatments include:  -activity modification  -physical therapy  -over the counter pain medications  -medrol dosepak  -cervical steroid injections -Patient has tried no specific treatments so far -Recommended PT and flexeril -If she is not doing any better at our next visit, will get an MRI of the cervical spine -Patient should return to office in 6 weeks, x-rays at next visit: none   Patient expressed understanding of the plan and all questions were answered to the patient's satisfaction.   ___________________________________________________________________________   History:  Patient is a 62 y.o. female who presents today for cervical spine. Patient has had one month of neck pain on the right side of the neck near the cranial aspect of the cervical spine. She says it started after pulling a shirt off of her head and it got caught on her chin. She has tried using some oxycodone she had from her hip replacement and that has been helpful. She has not tried any other treatments so far. She does not have any pain radiating into either upper extremity. Denies paresthesias and numbness.    Weakness: denies Difficulty with fine motor skills (e.g., buttoning shirts, handwriting): denies Symptoms of imbalance: denies Paresthesias and numbness: denies Bowel or bladder incontinence: denies  Saddle anesthesia: denies  Treatments tried: none so far  Review of systems: Denies fevers and chills, night sweats, unexplained weight loss, history of cancer, pain that wakes them at night  Past medical  history: Anemia Anxiety/Depression Chronic low back pain CKD Crohn's Diabetes (last A1C was 6.4 on 12/16/2022) GERD HTN IBD  Allergies: sulfa, sulfasalazine, bactrim  Past surgical history:  Hysterectomy C5-7 ACDF Knee arthroscopy Liver surgery Right bunion surgery Bilateral rotator cuff surgery Right THA  Social history: Denies use of nicotine product (smoking, vaping, patches, smokeless) Alcohol use: denies Denies recreational drug use   Physical Exam:  General: no acute distress, appears stated age Neurologic: alert, answering questions appropriately, following commands Respiratory: unlabored breathing on room air, symmetric chest rise Psychiatric: appropriate affect, normal cadence to speech   MSK (spine):  -Strength exam      Left  Right Grip strength                5/5  5/5 Interosseus   5/5   5/5 Wrist extension  5/5  5/5 Wrist flexion   5/5  5/5 Elbow flexion   5/5  5/5 Deltoid    5/5  5/5  -Sensory exam   Sensation intact to light touch in C5-T1 nerve distributions of bilateral upper extremities  -Spurling: negative bilaterally -Hoffman sign: negative bilaterally -Clonus: no beats bilaterally -Interosseous wasting: none seen -Grip and release test: negative -Romberg: negative -Gait: normal  Left shoulder exam: no pain through range of motion Right shoulder exam: no pain through range of motion  Imaging: XR of the cervical spine from 06/15/2023 was independently reviewed and interpreted, showing uninstrumented C5-7 anterior cervical fusion. Facet arthropathy at C3/4. Disc height loss at C4/5. Facet arthropathy at C7/T1. No fracture or dislocation seen. No evidence of instability on flexion/extension views.     Patient name: Sheila Vincent Patient MRN: [id number] Date of visit: 06/15/23

## 2023-06-18 ENCOUNTER — Ambulatory Visit (INDEPENDENT_AMBULATORY_CARE_PROVIDER_SITE_OTHER): Payer: 59 | Admitting: *Deleted

## 2023-06-18 DIAGNOSIS — J309 Allergic rhinitis, unspecified: Secondary | ICD-10-CM | POA: Diagnosis not present

## 2023-06-23 ENCOUNTER — Ambulatory Visit (INDEPENDENT_AMBULATORY_CARE_PROVIDER_SITE_OTHER): Payer: 59 | Admitting: *Deleted

## 2023-06-23 DIAGNOSIS — J309 Allergic rhinitis, unspecified: Secondary | ICD-10-CM

## 2023-06-29 ENCOUNTER — Ambulatory Visit (INDEPENDENT_AMBULATORY_CARE_PROVIDER_SITE_OTHER): Payer: 59 | Admitting: *Deleted

## 2023-06-29 ENCOUNTER — Telehealth: Payer: Self-pay | Admitting: *Deleted

## 2023-06-29 DIAGNOSIS — J309 Allergic rhinitis, unspecified: Secondary | ICD-10-CM | POA: Diagnosis not present

## 2023-06-29 NOTE — Telephone Encounter (Signed)
Patient came in for her allergy injections today and stated that she has still been having issues with congestion and sneezing and throat clearing. She states that she she is using Astelin 2 times daily, but is not using the Flonase since the ENT told her not to use it every day. She is taking Montelukast, and Zyrtec.

## 2023-06-30 ENCOUNTER — Ambulatory Visit: Payer: 59 | Admitting: Internal Medicine

## 2023-06-30 NOTE — Telephone Encounter (Signed)
Tried calling pt but no voicemail box is set up at this time

## 2023-06-30 NOTE — Telephone Encounter (Signed)
Patient called back and I informed her of Dr. Ellouise Newer recommendations and she wanted to schedule an appointment to be retested.

## 2023-06-30 NOTE — Telephone Encounter (Signed)
I would recommend that she double her antihistamine and/or alternate antihistamines every couple of months. We can also retest her to see if we are missing something.  Malachi Bonds, MD [facility]

## 2023-06-30 NOTE — Progress Notes (Signed)
No action done

## 2023-07-01 NOTE — Telephone Encounter (Signed)
Great - maybe we can set her up with a NP for skin testing.   Malachi Bonds, MD [facility]

## 2023-07-02 ENCOUNTER — Other Ambulatory Visit: Payer: Self-pay

## 2023-07-02 ENCOUNTER — Ambulatory Visit: Payer: 59 | Attending: Orthopedic Surgery

## 2023-07-02 DIAGNOSIS — M542 Cervicalgia: Secondary | ICD-10-CM | POA: Diagnosis present

## 2023-07-02 DIAGNOSIS — R293 Abnormal posture: Secondary | ICD-10-CM | POA: Diagnosis present

## 2023-07-02 DIAGNOSIS — M6281 Muscle weakness (generalized): Secondary | ICD-10-CM | POA: Insufficient documentation
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
## 2023-07-09 ENCOUNTER — Ambulatory Visit (INDEPENDENT_AMBULATORY_CARE_PROVIDER_SITE_OTHER): Payer: 59

## 2023-07-09 DIAGNOSIS — J309 Allergic rhinitis, unspecified: Secondary | ICD-10-CM | POA: Diagnosis not present

## 2023-07-15 ENCOUNTER — Encounter: Payer: Self-pay | Admitting: Physical Therapy

## 2023-07-15 ENCOUNTER — Ambulatory Visit: Payer: 59 | Admitting: Physical Therapy

## 2023-07-15 ENCOUNTER — Ambulatory Visit: Payer: 59 | Attending: Orthopedic Surgery | Admitting: Physical Therapy

## 2023-07-15 ENCOUNTER — Telehealth: Payer: Self-pay | Admitting: Physical Therapy

## 2023-07-15 DIAGNOSIS — M542 Cervicalgia: Secondary | ICD-10-CM

## 2023-07-15 DIAGNOSIS — R293 Abnormal posture: Secondary | ICD-10-CM

## 2023-07-15 DIAGNOSIS — M6281 Muscle weakness (generalized): Secondary | ICD-10-CM | POA: Insufficient documentation

## 2023-07-15 NOTE — Telephone Encounter (Signed)
Pt called clinic >15 minutes after scheduled appointment time to ask if there was a later appointment as she was too late to make her initial appointment. She was able to reschedule to later appointment, same day. Patient was advised of attendance policy during appointment and she verbalized understanding.
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
## 2023-07-21 ENCOUNTER — Ambulatory Visit: Payer: 59

## 2023-07-21 DIAGNOSIS — R293 Abnormal posture: Secondary | ICD-10-CM

## 2023-07-21 DIAGNOSIS — M542 Cervicalgia: Secondary | ICD-10-CM

## 2023-07-21 DIAGNOSIS — M6281 Muscle weakness (generalized): Secondary | ICD-10-CM

## 2023-07-27 ENCOUNTER — Ambulatory Visit (INDEPENDENT_AMBULATORY_CARE_PROVIDER_SITE_OTHER): Payer: 59 | Admitting: Orthopedic Surgery

## 2023-07-27 DIAGNOSIS — M542 Cervicalgia: Secondary | ICD-10-CM

## 2023-07-27 NOTE — Progress Notes (Signed)
Orthopedic Spine Surgery Office Note   Assessment: Patient is a 62 y.o. female with one month of neck pain located on the right side. Possibly from facet arthropathy at C3/4. Has improved significantly with PT     Plan: -Explained that initially conservative treatment is tried as a significant number of patients may experience relief with these treatment modalities. Discussed that the conservative treatments include:             -activity modification             -physical therapy             -over the counter pain medications             -medrol dosepak             -cervical steroid injections -Patient has tried PT, flexeril, home exercise program -Since she is doing better, encouraged her to continue with the home exercise program -Patient should return to office on an as-needed basis     Patient expressed understanding of the plan and all questions were answered to the patient's satisfaction.    ___________________________________________________________________________     History:   Patient is a 62 y.o. female who presents today for follow-up on her cervical spine.  Patient is doing much better since the last time I saw her.  She is having minimal neck pain at this point.  She states for the last week her neck has not bothered her at all.  She has been doing physical therapy and home exercise program.  She is not having any pain radiating into either upper extremity.  Denies paresthesia numbness.   Treatments tried: home exercise program, PT, Flexeril     Physical Exam:   General: no acute distress, appears stated age Neurologic: alert, answering questions appropriately, following commands Respiratory: unlabored breathing on room air, symmetric chest rise Psychiatric: appropriate affect, normal cadence to speech     MSK (spine):   -Strength exam                                                   Left                  Right Grip strength                5/5                   5/5 Interosseus                  5/5                  5/5 Wrist extension            5/5                  5/5 Wrist flexion                 5/5                  5/5 Elbow flexion                5/5                  5/5 Deltoid  5/5                  5/5   -Sensory exam                         Sensation intact to light touch in C5-T1 nerve distributions of bilateral upper extremities   Left shoulder exam: no pain through range of motion Right shoulder exam: no pain through range of motion   Imaging: XR of the cervical spine from 06/15/2023 was previously independently reviewed and interpreted, showing uninstrumented C5-7 anterior cervical fusion. Facet arthropathy at C3/4. Disc height loss at C4/5. Facet arthropathy at C7/T1. No fracture or dislocation seen. No evidence of instability on flexion/extension views.       Patient name: Sheila Vincent Patient MRN: [id number] Date of visit: 07/27/23

## 2023-07-28 ENCOUNTER — Ambulatory Visit: Payer: 59

## 2023-08-03 ENCOUNTER — Ambulatory Visit (INDEPENDENT_AMBULATORY_CARE_PROVIDER_SITE_OTHER): Payer: 59

## 2023-08-03 DIAGNOSIS — J309 Allergic rhinitis, unspecified: Secondary | ICD-10-CM | POA: Diagnosis not present

## 2023-08-04 ENCOUNTER — Other Ambulatory Visit: Payer: Self-pay

## 2023-08-04 ENCOUNTER — Ambulatory Visit (INDEPENDENT_AMBULATORY_CARE_PROVIDER_SITE_OTHER): Payer: 59 | Admitting: Allergy & Immunology

## 2023-08-04 ENCOUNTER — Encounter: Payer: Self-pay | Admitting: Allergy & Immunology

## 2023-08-04 VITALS — BP 122/78 | HR 75 | Temp 98.0°F | Resp 16 | Ht 64.0 in | Wt 158.7 lb

## 2023-08-04 DIAGNOSIS — R0981 Nasal congestion: Secondary | ICD-10-CM | POA: Diagnosis not present

## 2023-08-04 DIAGNOSIS — K219 Gastro-esophageal reflux disease without esophagitis: Secondary | ICD-10-CM | POA: Diagnosis not present

## 2023-08-04 DIAGNOSIS — J3089 Other allergic rhinitis: Secondary | ICD-10-CM | POA: Diagnosis not present

## 2023-08-04 DIAGNOSIS — J302 Other seasonal allergic rhinitis: Secondary | ICD-10-CM

## 2023-08-04 DIAGNOSIS — R430 Anosmia: Secondary | ICD-10-CM

## 2023-08-04 DIAGNOSIS — J4540 Moderate persistent asthma, uncomplicated: Secondary | ICD-10-CM | POA: Diagnosis not present

## 2023-08-04 NOTE — Patient Instructions (Addendum)
1. Moderate persistent asthma, uncomplicated - Spirometry not done today.  - Daily controller medication(s): Singulair (montelukast) 10mg  daily - Prior to physical activity: albuterol 2 puffs 10-15 minutes before physical activity. - Rescue medications: albuterol 4 puffs every 4-6 hours as needed - Changes during respiratory infections or worsening symptoms: Add on Symbicort to 2 puffs twice daily for TWO WEEKS. - Asthma control goals:  * Full participation in all desired activities (may need albuterol before activity) * Albuterol use two time or less a week on average (not counting use with activity) * Cough interfering with sleep two time or less a month * Oral steroids no more than once a year * No hospitalizations  2. Perennial and seasonal allergic rhinitis - Continue with allergy shots at the same schedule. - Continue with azelastine nasal spray one spray per nostril up to twice daily as needed. - Continue with your cetirizine and the montelukast.  - We are getting repeat allergy testing via the blood to see if we are not missing something. - We will schedule you for skin testing (STOP ANTIHISTAMINES FOR THREE DAYS BEFORE THE NEXT APPOINTMENT).   3. Return in about 1 week (around 08/11/2023) for ALLERGY TESTING.   Please inform us of any Emergency Department visits, hospitalizations, or changes in symptoms. Call us before going to the ED for breathing or allergy symptoms since we might be able to fit you in for a sick visit. Feel free to contact us anytime with any questions, problems, or concerns.  It was a pleasure to see you again today!  Websites that have reliable patient information: 1. American Academy of Asthma, Allergy, and Immunology: www.aaaai.org 2. Food Allergy Research and Education (FARE): foodallergy.org 3. Mothers of Asthmatics: http://www.asthmacommunitynetwork.org 4. American College of Allergy, Asthma, and Immunology: www.acaai.org   COVID-19 Vaccine  Information can be found at: [email or web address] For questions related to vaccine distribution or appointments, please email [email or web address] or call [phone].   We realize that you might be concerned about having an allergic reaction to the COVID19 vaccines. To help with that concern, WE ARE OFFERING THE COVID19 VACCINES IN OUR OFFICE! Ask the front desk for dates!     "Like" [place] on Facebook and Instagram for our latest updates!      A healthy democracy works best when [employer] participate! Make sure you are registered to vote! If you have moved or changed any of your contact information, you will need to get this updated before voting!  In some cases, you MAY be able to register to vote online: [email or web address]

## 2023-08-04 NOTE — Progress Notes (Unsigned)
FOLLOW UP  Date of Service/Encounter:  08/04/23   Assessment:   Moderate persistent asthma, uncomplicated   Perennial and seasonal allergic rhinitis (grasses, ragweed, weeds, molds, cat, dog, dust mite) - on allergen immunotherapy (started June 2019 and reached maintenance in October 2019)   Chronic nasal congestion - with mostly normal sinus CT (consider migraine eval?)    Heart murmur and hypertension - followed by Cardiology   Iron deficiency anemia - followed by hematology   Inflammatory bowel disease - followed by Dr. Orvan Falconer   GERD   Chronic pain - with a hip surgery scheduled in late February 2024    Disabled status  Plan/Recommendations:   Assessment and Plan              There are no Patient Instructions on file for this visit.   Subjective:   Sheila Vincent is a 62 y.o. female presenting today for follow up of No chief complaint on file.   Sheila Vincent has a history of the following: Patient Active Problem List   Diagnosis Date Noted  . 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/25/2022  . Recurrent infections 09/22/2022  . Acute bacterial sinusitis 09/08/2022  . At increased risk of exposure to COVID-19 virus 09/08/2022  . Gait abnormality 06/16/2022  . Right hip pain 06/16/2022  . Numbness of right foot 06/16/2022  . Cerebral vascular disease 06/16/2022  . Crohn's disease of large bowel (HCC) 06/05/2022  . Diarrhea 06/05/2022  . Rectal bleeding 06/05/2022  . IBD (inflammatory bowel disease) 08/08/2021  . Symptomatic anemia 08/08/2021  . Iron deficiency anemia due to chronic blood loss 08/08/2021  . Laryngopharyngeal reflux (LPR) 09/09/2019  . Sudden right hearing loss 09/09/2019  . Tinnitus of right ear 09/09/2019  . Eustachian tube dysfunction, right 09/05/2019  . Bug bite 04/21/2019  . Heart murmur 04/21/2019  . De Quervain's syndrome (tenosynovitis) 02/23/2019  . Primary osteoarthritis of first carpometacarpal joint of left  hand 10/11/2018  . GERD (gastroesophageal reflux disease) 04/06/2018  . Cough, persistent 04/06/2018  . Hypertension 03/30/2018  . Seasonal and perennial allergic rhinitis 07/14/2015  . Moderate persistent asthma 07/14/2015  . Upper airway cough syndrome 12/06/2014  . Right foot pain 07/06/2014  . Low back pain 07/06/2014    History obtained from: chart review and {Persons; PED relatives w/patient:19415::"patient"}.  Sheila Vincent is a 62 y.o. female presenting for {Blank single:19197::"a food challenge","a drug challenge","skin testing","a sick visit","an evaluation of ***","a follow up visit"}.  She was last seen in May 2024.  At that time, spirometry was great.  We continue with Singulair 10 mg daily.  We also continue with Symbicort 2 puffs twice daily added during flares.  For his rhinitis, we continue with allergy shots as well as Astelin and Zyrtec.  Since last visit,  Discussed the use of AI scribe software for clinical note transcription with the patient, who gave verbal consent to proceed.  History of Present Illness            {Blank single:19197::"Asthma/Respiratory Symptom History: ***"," "}  {Blank single:19197::"Allergic Rhinitis Symptom History: ***"," "}  She did see Dr. Ernestene Kiel in February 2024. She was placed on doxycycline as well as Bactroban.   {Blank single:19197::"Food Allergy Symptom History: ***"," "}  {Blank single:19197::"Skin Symptom History: ***"," "}  {Blank single:19197::"GERD Symptom History: ***"," "}  Otherwise, there have been no changes to her past medical history, surgical history, family history, or social history.    Review of systems otherwise negative other than  that mentioned in the HPI.    Objective:   There were no vitals taken for this visit. There is no height or weight on file to calculate BMI.    Physical Exam   Diagnostic studies: {Blank single:19197::"none","deferred due to recent antihistamine use","labs sent  instead"," "}  Spirometry: {Blank single:19197::"results normal (FEV1: ***%, FVC: ***%, FEV1/FVC: ***%)","results abnormal (FEV1: ***%, FVC: ***%, FEV1/FVC: ***%)"}.    {Blank single:19197::"Spirometry consistent with mild obstructive disease","Spirometry consistent with moderate obstructive disease","Spirometry consistent with severe obstructive disease","Spirometry consistent with possible restrictive disease","Spirometry consistent with mixed obstructive and restrictive disease","Spirometry uninterpretable due to technique","Spirometry consistent with normal pattern"}. {Blank single:19197::"Albuterol/Atrovent nebulizer","Xopenex/Atrovent nebulizer","Albuterol nebulizer","Albuterol four puffs via MDI","Xopenex four puffs via MDI"} treatment given in clinic with {Blank single:19197::"significant improvement in FEV1 per ATS criteria","significant improvement in FVC per ATS criteria","significant improvement in FEV1 and FVC per ATS criteria","improvement in FEV1, but not significant per ATS criteria","improvement in FVC, but not significant per ATS criteria","improvement in FEV1 and FVC, but not significant per ATS criteria","no improvement"}.  Allergy Studies: {Blank single:19197::"none","labs sent instead"," "}    {Blank single:19197::"Allergy testing results were read and interpreted by myself, documented by clinical staff."," "}      Malachi Bonds, MD  [facility]

## 2023-08-05 ENCOUNTER — Telehealth: Payer: Self-pay

## 2023-08-05 ENCOUNTER — Encounter: Payer: Self-pay | Admitting: Allergy & Immunology

## 2023-08-05 NOTE — Telephone Encounter (Signed)
Called [phone] - spoke to Jo T. - DOB verified - stated no prior auth /pre cert is needed for CT Maxillofacial wo contrast. Ref# [id number]   CT Maxillofacial has been scheduled for 09/15/23 @ 12.20 pm at [facility] - Patient is aware.

## 2023-08-05 NOTE — Addendum Note (Signed)
Addended by: Alfonse Spruce on: 08/05/2023 01:34 PM   Modules accepted: Level of Service

## 2023-08-07 LAB — ALLERGEN PROFILE, MOLD
Aureobasidi Pullulans IgE: 0.12 kU/L — AB
Candida Albicans IgE: <0.1 kU/L
M009-IgE Fusarium proliferatum: 0.13 kU/L — AB
M014-IgE Epicoccum purpur: <0.1 kU/L
Mucor Racemosus IgE: <0.1 kU/L
Phoma Betae IgE: <0.1 kU/L
Setomelanomma Rostrat: <0.1 kU/L
Stemphylium Herbarum IgE: <0.1 kU/L

## 2023-08-07 LAB — ALLERGENS W/COMP RFLX AREA 2
Alternaria Alternata IgE: <0.1 kU/L
Aspergillus Fumigatus IgE: <0.1 kU/L
Bermuda Grass IgE: 0.22 kU/L — AB
Cedar, Mountain IgE: 0.22 kU/L — AB
Cladosporium Herbarum IgE: <0.1 kU/L
Cockroach, German IgE: <0.1 kU/L
Common Silver Birch IgE: 0.16 kU/L — AB
Cottonwood IgE: 0.23 kU/L — AB
D Farinae IgE: 0.12 kU/L — AB
D Pteronyssinus IgE: <0.1 kU/L
E001-IgE Cat Dander: 0.19 kU/L — AB
E005-IgE Dog Dander: 0.21 kU/L — AB
Elm, American IgE: 0.26 kU/L — AB
IgE (Immunoglobulin E), Serum: 427 [IU]/mL (ref 6–495)
Johnson Grass IgE: 0.24 kU/L — AB
Maple/Box Elder IgE: 0.29 kU/L — AB
Mouse Urine IgE: <0.1 kU/L
Oak, White IgE: 0.22 kU/L — AB
Pecan, Hickory IgE: 0.23 kU/L — AB
Penicillium Chrysogen IgE: <0.1 kU/L
Pigweed, Rough IgE: 0.14 kU/L — AB
Ragweed, Short IgE: 0.28 kU/L — AB
Sheep Sorrel IgE Qn: 0.14 kU/L — AB
Timothy Grass IgE: 0.24 kU/L — AB
White Mulberry IgE: <0.1 kU/L

## 2023-08-11 ENCOUNTER — Encounter: Payer: Self-pay | Admitting: Allergy & Immunology

## 2023-08-11 ENCOUNTER — Ambulatory Visit (INDEPENDENT_AMBULATORY_CARE_PROVIDER_SITE_OTHER): Payer: 59 | Admitting: Allergy & Immunology

## 2023-08-11 ENCOUNTER — Other Ambulatory Visit: Payer: Self-pay

## 2023-08-11 VITALS — BP 130/78 | HR 80 | Temp 98.7°F | Resp 14

## 2023-08-11 DIAGNOSIS — J3089 Other allergic rhinitis: Secondary | ICD-10-CM | POA: Diagnosis not present

## 2023-08-11 DIAGNOSIS — J302 Other seasonal allergic rhinitis: Secondary | ICD-10-CM

## 2023-08-11 NOTE — Progress Notes (Unsigned)
FOLLOW UP  Date of Service/Encounter:  08/11/23   Assessment:   Seasonal and perennial allergic rhinitis (grasses, ragweed, weeds, trees, outdoor molds, dust mites, cat, and dog)  Plan/Recommendations:   1. Seasonal and perennial allergic rhinitis - Testing today showed: grasses, ragweed, weeds, trees, outdoor molds, dust mites, cat, and dog - Copy of test results provided.  - We are going to use this testing today combined with your blood work to re-mix your allergy shots. - We may not be able to make immediately depending on what insurance will cover, so keep coming in for shots on your regular schedule.  - Continue with: all of your current medications - I will make the new shots stronger and focus on the more relevant allergens.   2. Return in about 3 months (around 11/11/2023).    Subjective:   Sheila Vincent is a 62 y.o. female presenting today for follow up of  Chief Complaint  Patient presents with   Allergic Rhinitis    Allergy Testing    Sheila Vincent has a history of the following: Patient Active Problem List   Diagnosis Date Noted   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/25/2022   Recurrent infections 09/22/2022   Acute bacterial sinusitis 09/08/2022   At increased risk of exposure to COVID-19 virus 09/08/2022   Gait abnormality 06/16/2022   Right hip pain 06/16/2022   Numbness of right foot 06/16/2022   Cerebral vascular disease 06/16/2022   Crohn's disease of large bowel (HCC) 06/05/2022   Diarrhea 06/05/2022   Rectal bleeding 06/05/2022   IBD (inflammatory bowel disease) 08/08/2021   Symptomatic anemia 08/08/2021   Iron deficiency anemia due to chronic blood loss 08/08/2021   Laryngopharyngeal reflux (LPR) 09/09/2019   Sudden right hearing loss 09/09/2019   Tinnitus of right ear 09/09/2019   Eustachian tube dysfunction, right 09/05/2019   Bug bite 04/21/2019   Heart murmur 04/21/2019   De Quervain's syndrome (tenosynovitis) 02/23/2019    Primary osteoarthritis of first carpometacarpal joint of left hand 10/11/2018   GERD (gastroesophageal reflux disease) 04/06/2018   Cough, persistent 04/06/2018   Hypertension 03/30/2018   Seasonal and perennial allergic rhinitis 07/14/2015   Moderate persistent asthma 07/14/2015   Upper airway cough syndrome 12/06/2014   Right foot pain 07/06/2014   Low back pain 07/06/2014    History obtained from: chart review and patient.  Care team PCP: Rometta Emery, MD Orthopedic surgeon: Dr. Vira Browns Cardiology: Dr. Tessa Lerner Physical Therapist: Edwinna Areola Gastroenterologist: Dr. Tressia Danas Hematologist: Dr. Jeanie Sewer  Urologist: Dr. Chesley Mires is a 62 y.o. female presenting for skin testing.  She was last seen 1 week ago.  At that time, she expected to have skin testing done, but she had taken antihistamines.  She has been on shots for a number of years but has felt lately that they are not as efficacious.  Since the last visit, she has done well. Review of her immunotherapy regimen shown below:   [name] is on allergen immunotherapy. She receives two injections. Immunotherapy script #1 contains weeds, grasses, molds, dust mites, cat and dog. She currently receives 0.61mL of the RED vial (1/100). Immunotherapy script #2 contains molds. She currently receives 0.22mL of the RED vial (1/100). She started shots June of 2019 and reached maintenance in October 2019.   Otherwise, there have been no changes to her past medical history, surgical history, family history, or social history.    Review of systems  otherwise negative other than that mentioned in the HPI.    Objective:   Blood pressure 130/78, pulse 80, temperature 98.7 F (37.1 C), temperature source Temporal, resp. rate 14, SpO2 98%. There is no height or weight on file to calculate BMI.    Physical exam deferred since this was a skin testing appointment only.   Diagnostic studies:    Allergy Studies:     Airborne Adult Perc - 08/11/23 1400     Time Antigen Placed 1401    Allergen Manufacturer Waynette Buttery    Location Back    Number of Test 55    1. Control-Buffer 50% Glycerol Negative    2. Control-Histamine 2+    3. Bahia Negative    4. French Southern Territ[place] Negative    5. Johnson Negative    6. Kentucky Blue Negative    7. Meadow Fescue Negative    8. Perennial Rye Negative    9. Timothy Negative    10. Ragweed Mix Negative    11. Cocklebur Negative    12. Plantain,  English Negative    13. Baccharis Negative    14. Dog Fennel Negative    15. Russian Thistle Negative    16. Lamb's Quarters Negative    17. Sheep Sorrell Negative    18. Rough Pigweed Negative    19. Marsh Elder, Rough Negative    20. Mugwort, Common Negative    21. Box, Elder Negative    22. Cedar, red Negative    23. Sweet Gum Negative    24. Pecan Pollen Negative    25. Pine Mix Negative    26. Walnut, Black Pollen Negative    27. Red Mulberry Negative    28. Ash Mix Negative    29. Birch Mix Negative    30. Beech American Negative    31. Cottonwood, Guinea-[place]iss[place] Negative    32. Hickory, White Negative    33. Maple Mix Negative    34. Oak, [place] Mix Negative    35. Sycamore Eastern Negative    36. Alternaria Alternata Negative    37. Cladosporium Herbarum Negative    38. Aspergillus Mix Negative    39. Penicillium Mix Negative    40. Bipolaris Sorokiniana (Helminthosporium) Negative    41. Drechslera Spicifera (Curvularia) Negative    42. Mucor Plumbeus Negative    43. Fusarium Moniliforme Negative    44. Aureobasidium Pullulans (pullulara) Negative    45. Rhizopus Oryzae Negative    46. Botrytis Cinera Negative    47. Epicoccum Nigrum Negative    48. Phoma Betae Negative    49. Dust Mite Mix Negative    50. Cat Hair 10,000 BAU/ml Negative    51.  Dog Epithelia Negative    52. Mixed Feathers Negative    53. Horse Epithelia Negative    54. Cockroach, German Negative    55. Tobacco Leaf  Negative             Intradermal - 08/11/23 1405     Time Antigen Placed 1415    Allergen Manufacturer Waynette Buttery    Location Arm    Number of Test 16    Intradermal Select    Control Negative    Bahia Negative    [place] 3+    Johnson Negative    7 Grass Negative    Ragweed Mix 2+    Weed Mix 3+    Tree Mix 4+    Mold 1 2+    Mold 2 Negative  Mold 3 3+    Mold 4 Negative    Mite Mix 3+    Cat 4+    Dog 4+    Cockroach Negative             Allergy testing results were read and interpreted by myself, documented by clinical staff.      Malachi Bonds, MD  [facility]

## 2023-08-11 NOTE — Patient Instructions (Signed)
1. Seasonal and perennial allergic rhinitis - Testing today showed: grasses, ragweed, weeds, trees, outdoor molds, dust mites, cat, and dog - Copy of test results provided.  - We are going to use this testing today combined with your blood work to re-mix your allergy shots. - We may not be able to make immediately depending on what insurance will cover, so keep coming in for shots on your regular schedule.  - Continue with: all of your current medications - I will make the new shots stronger and focus on the more relevant allergens.   2. Return in about 3 months (around 11/11/2023).    Please inform us of any Emergency Department visits, hospitalizations, or changes in symptoms. Call us before going to the ED for breathing or allergy symptoms since we might be able to fit you in for a sick visit. Feel free to contact us anytime with any questions, problems, or concerns.  It was a pleasure to see you again today!  Websites that have reliable patient information: 1. American Academy of Asthma, Allergy, and Immunology: www.aaaai.org 2. Food Allergy Research and Education (FARE): foodallergy.org 3. Mothers of Asthmatics: http://www.asthmacommunitynetwork.org 4. American College of Allergy, Asthma, and Immunology: www.acaai.org   COVID-19 Vaccine Information can be found at: [email or web address] For questions related to vaccine distribution or appointments, please email [email or web address] or call [phone].     "Like" [place] on Facebook and Instagram for our latest updates!      A healthy democracy works best when [employer] participate! Make sure you are registered to vote! If you have moved or changed any of your contact information, you will need to get this updated before voting! Scan the QR codes below to learn more!          Airborne Adult Perc - 08/11/23 1400     Time Antigen Placed 1401    Allergen Manufacturer  Waynette Buttery    Location Back    Number of Test 55    1. Control-Buffer 50% Glycerol Negative    2. Control-Histamine 2+    3. Bahia Negative    4. [place] Negative    5. Johnson Negative    6. Kentucky Blue Negative    7. Meadow Fescue Negative    8. Perennial Rye Negative    9. Timothy Negative    10. Ragweed Mix Negative    11. Cocklebur Negative    12. Plantain,  English Negative    13. Baccharis Negative    14. Dog Fennel Negative    15. Russian Thistle Negative    16. Lamb's Quarters Negative    17. Sheep Sorrell Negative    18. Rough Pigweed Negative    19. Marsh Elder, Rough Negative    20. Mugwort, Common Negative    21. Box, Elder Negative    22. Cedar, red Negative    23. Sweet Gum Negative    24. Pecan Pollen Negative    25. Pine Mix Negative    26. Walnut, Black Pollen Negative    27. Red Mulberry Negative    28. Ash Mix Negative    29. Birch Mix Negative    30. Beech American Negative    31. Cottonwood, Guinea-[place]iss[place] Negative    32. Hickory, White Negative    33. Maple Mix Negative    34. Oak, [place] Mix Negative    35. Sycamore Eastern Negative    36. Alternaria Alternata Negative    37. Cladosporium Herbarum Negative    38. Aspergillus Mix Negative  39. Penicillium Mix Negative    40. Bipolaris Sorokiniana (Helminthosporium) Negative    41. Drechslera Spicifera (Curvularia) Negative    42. Mucor Plumbeus Negative    43. Fusarium Moniliforme Negative    44. Aureobasidium Pullulans (pullulara) Negative    45. Rhizopus Oryzae Negative    46. Botrytis Cinera Negative    47. Epicoccum Nigrum Negative    48. Phoma Betae Negative    49. Dust Mite Mix Negative    50. Cat Hair 10,000 BAU/ml Negative    51.  Dog Epithelia Negative    52. Mixed Feathers Negative    53. Horse Epithelia Negative    54. Cockroach, German Negative    55. Tobacco Leaf Negative             Intradermal - 08/11/23 1405     Time Antigen Placed 1415    Allergen Manufacturer Waynette Buttery     Location Arm    Number of Test 16    Intradermal Select    Control Negative    Bahia Negative    [place] 3+    Johnson Negative    7 Grass Negative    Ragweed Mix 2+    Weed Mix 3+    Tree Mix 4+    Mold 1 2+    Mold 2 Negative    Mold 3 3+    Mold 4 Negative    Mite Mix 3+    Cat 4+    Dog 4+    Cockroach Negative

## 2023-08-12 NOTE — Progress Notes (Signed)
MAKE VIALS WHEN NEEDED. 

## 2023-08-12 NOTE — Progress Notes (Signed)
Aeroallergen Immunotherapy  Ordering Provider: Dr. Malachi Bonds  Patient Details Name: Sheila Vincent MRN: [id number] Date of Birth: 1961/05/31  Order 1 of 2  Vial Label: Molds/RW  0.6 ml (Volume)  1:20 Concentration -- Ragweed Mix 0.2 ml (Volume)  1:20 Concentration -- Alternaria alternata 0.2 ml (Volume)  1:20 Concentration -- Cladosporium herbarum 0.2 ml (Volume)  1:20 Concentration -- Bipolaris sorokiniana 0.2 ml (Volume)  1:20 Concentration -- Drechslera spicifera 0.2 ml (Volume)  1:10 Concentration -- Mucor plumbeus 0.2 ml (Volume)  1:10 Concentration -- Fusarium moniliforme 0.2 ml (Volume)  1:40 Concentration -- Aureobasidium pullulans   2.0  ml Extract Subtotal 3.0  ml Diluent 5.0  ml Maintenance Total  Schedule:  B  Blue Vial (1:100,000): Schedule B (6 doses) Yellow Vial (1:10,000): Schedule B (6 doses) Green Vial (1:1,000): Schedule B (6 doses) Red Vial (1:100): Schedule A (14 doses)  Special Instructions: After completion of the first Red Vial, please space to every two weeks. After completion of the second Red Vial, please space to every 4 weeks. Ok to up dose new vials at 0.16mL --> 0.3 mL --> 0.5 mL. Ok to come twice weekly, if desired, as long as there is 48 hours between injections.

## 2023-08-12 NOTE — Progress Notes (Signed)
Aeroallergen Immunotherapy  Ordering Provider: Dr. Malachi Bonds  Patient Details Name: Sheila Vincent MRN: [id number] Date of Birth: [birth year]  Order 2 of 2  Vial Label: G/W/T/C/D/DM  0.3 ml (Volume)  BAU Concentration -- 7 Grass Mix* 100,000 ([address] [place], [place], [place], [place], Red[place], Sweet [place], [place]) 0.3 ml (Volume)  BAU Concentration -- [place] 10,000 0.2 ml (Volume)  1:20 Concentration -- [place] 0.5 ml (Volume)  1:20 Concentration -- Weed Mix* 0.5 ml (Volume)  1:20 Concentration -- Eastern 10 Tree Mix (also Sweet Gum) 0.1 ml (Volume)  1:10 Concentration -- Birch mix* 0.1 ml (Volume)  1:20 Concentration -- [place] American* 0.2 ml (Volume)  1:10 Concentration -- Cedar, red 0.1 ml (Volume)  1:10 Concentration -- Hickory* 0.1 ml (Volume)  1:20 Concentration -- Maple Mix* 0.2 ml (Volume)  1:10 Concentration -- Pecan Pollen 0.5 ml (Volume)  1:10 Concentration -- Cat Hair 0.5 ml (Volume)  1:10 Concentration -- Dog Epithelia 0.8 ml (Volume)   AU Concentration -- Mite Mix (DF 5,000 & DP 5,000)   4.4  ml Extract Subtotal 0.6  ml Diluent 5.0  ml Maintenance Total  Schedule:  B  Blue Vial (1:100,000): Schedule B (6 doses) Yellow Vial (1:10,000): Schedule B (6 doses) Green Vial (1:1,000): Schedule B (6 doses) Red Vial (1:100): Schedule A (14 doses)  Special Instructions: After completion of the first Red Vial, please space to every two weeks. After completion of the second Red Vial, please space to every 4 weeks. Ok to up dose new vials at 0.62mL --> 0.3 mL --> 0.5 mL. Ok to come twice weekly, if desired, as long as there is 48 hours between injections.

## 2023-08-31 ENCOUNTER — Telehealth: Payer: Self-pay | Admitting: *Deleted

## 2023-08-31 ENCOUNTER — Ambulatory Visit (INDEPENDENT_AMBULATORY_CARE_PROVIDER_SITE_OTHER): Payer: 59 | Admitting: *Deleted

## 2023-08-31 DIAGNOSIS — J309 Allergic rhinitis, unspecified: Secondary | ICD-10-CM

## 2023-08-31 NOTE — Telephone Encounter (Signed)
Patient was under the impression that she was going to start her new allergy vials today that have had a change in prescription. She is wanting to start the new prescription as soon as possible. Can we please get these made at the earliest convenience? Please let me know when made and I will call the patient and set up her coming in to receive the new prescription.

## 2023-09-03 DIAGNOSIS — J3081 Allergic rhinitis due to animal (cat) (dog) hair and dander: Secondary | ICD-10-CM | POA: Diagnosis not present

## 2023-09-03 NOTE — Progress Notes (Signed)
EXP 09/02/24

## 2023-09-03 NOTE — Progress Notes (Signed)
Aeroallergen Immunotherapy  Ordering Provider: Dr. Malachi Bonds  Patient Details Name: Sheila Vincent MRN: [id number] Date of Birth: 1961/05/31  Order 1 of 2  Vial Label: Molds/RW  0.6 ml (Volume)  1:20 Concentration -- Ragweed Mix 0.2 ml (Volume)  1:20 Concentration -- Alternaria alternata 0.2 ml (Volume)  1:20 Concentration -- Cladosporium herbarum 0.2 ml (Volume)  1:20 Concentration -- Bipolaris sorokiniana 0.2 ml (Volume)  1:20 Concentration -- Drechslera spicifera 0.2 ml (Volume)  1:10 Concentration -- Mucor plumbeus 0.2 ml (Volume)  1:10 Concentration -- Fusarium moniliforme 0.2 ml (Volume)  1:40 Concentration -- Aureobasidium pullulans   2.0  ml Extract Subtotal 3.0  ml Diluent 5.0  ml Maintenance Total  Schedule:  B  Blue Vial (1:100,000): Schedule B (6 doses) Yellow Vial (1:10,000): Schedule B (6 doses) Green Vial (1:1,000): Schedule B (6 doses) Red Vial (1:100): Schedule A (14 doses)  Special Instructions: After completion of the first Red Vial, please space to every two weeks. After completion of the second Red Vial, please space to every 4 weeks. Ok to up dose new vials at 0.16mL --> 0.3 mL --> 0.5 mL. Ok to come twice weekly, if desired, as long as there is 48 hours between injections.

## 2023-09-03 NOTE — Progress Notes (Signed)
Aeroallergen Immunotherapy  Ordering Provider: Dr. Malachi Bonds  Patient Details Name: Sheila Vincent MRN: [id number] Date of Birth: [birth year]  Order 2 of 2  Vial Label: G/W/T/C/D/DM  0.3 ml (Volume)  BAU Concentration -- 7 Grass Mix* 100,000 ([address] [place], [place], [place], [place], Red[place], Sweet [place], [place]) 0.3 ml (Volume)  BAU Concentration -- [place] 10,000 0.2 ml (Volume)  1:20 Concentration -- [place] 0.5 ml (Volume)  1:20 Concentration -- Weed Mix* 0.5 ml (Volume)  1:20 Concentration -- Eastern 10 Tree Mix (also Sweet Gum) 0.1 ml (Volume)  1:10 Concentration -- Birch mix* 0.1 ml (Volume)  1:20 Concentration -- [place] American* 0.2 ml (Volume)  1:10 Concentration -- Cedar, red 0.1 ml (Volume)  1:10 Concentration -- Hickory* 0.1 ml (Volume)  1:20 Concentration -- Maple Mix* 0.2 ml (Volume)  1:10 Concentration -- Pecan Pollen 0.5 ml (Volume)  1:10 Concentration -- Cat Hair 0.5 ml (Volume)  1:10 Concentration -- Dog Epithelia 0.8 ml (Volume)   AU Concentration -- Mite Mix (DF 5,000 & DP 5,000)   4.4  ml Extract Subtotal 0.6  ml Diluent 5.0  ml Maintenance Total  Schedule:  B  Blue Vial (1:100,000): Schedule B (6 doses) Yellow Vial (1:10,000): Schedule B (6 doses) Green Vial (1:1,000): Schedule B (6 doses) Red Vial (1:100): Schedule A (14 doses)  Special Instructions: After completion of the first Red Vial, please space to every two weeks. After completion of the second Red Vial, please space to every 4 weeks. Ok to up dose new vials at 0.62mL --> 0.3 mL --> 0.5 mL. Ok to come twice weekly, if desired, as long as there is 48 hours between injections.

## 2023-09-03 NOTE — Telephone Encounter (Signed)
Patient called back advised her of new prescription appointment & to continue with current vials next week. Patient verbalized understanding.

## 2023-09-03 NOTE — Telephone Encounter (Signed)
I spoke with Damita and she is currently making her allergy vials. The patient will be starting a new prescription and will be starting with Blue vials and building up. I scheduled the patient to start in an open slot since they are being made. I attempted to call the patient on both numbers provided, but there was no answer and no voicemail set up. I will attempt to come again. She can come in next week to receive her injections from her current vials until she starts the new vials the following week.

## 2023-09-04 DIAGNOSIS — J302 Other seasonal allergic rhinitis: Secondary | ICD-10-CM | POA: Diagnosis not present

## 2023-09-07 ENCOUNTER — Ambulatory Visit (INDEPENDENT_AMBULATORY_CARE_PROVIDER_SITE_OTHER): Payer: 59 | Admitting: *Deleted

## 2023-09-07 DIAGNOSIS — J309 Allergic rhinitis, unspecified: Secondary | ICD-10-CM

## 2023-09-15 ENCOUNTER — Ambulatory Visit (INDEPENDENT_AMBULATORY_CARE_PROVIDER_SITE_OTHER): Payer: 59 | Admitting: *Deleted

## 2023-09-15 ENCOUNTER — Ambulatory Visit
Admission: RE | Admit: 2023-09-15 | Discharge: 2023-09-15 | Disposition: A | Discharge status: Home / Self Care | Payer: 59 | Source: Ambulatory Visit | Attending: Allergy & Immunology | Admitting: Allergy & Immunology

## 2023-09-15 DIAGNOSIS — R0981 Nasal congestion: Secondary | ICD-10-CM

## 2023-09-15 DIAGNOSIS — J309 Allergic rhinitis, unspecified: Secondary | ICD-10-CM

## 2023-09-15 DIAGNOSIS — R430 Anosmia: Secondary | ICD-10-CM

## 2023-09-15 NOTE — Progress Notes (Signed)
Immunotherapy   Patient Details  Name: Sheila Vincent MRN: [id number] Date of Birth: 01/07/1961  09/15/2023  Hannah Beat started injections for  G-W-T-C-D-DM, MOLDS-RW *NEW RX* Following schedule: B  Frequency:2 times per week Epi-Pen:Epi-Pen Available  Consent signed and patient instructions given. Patient started new Allergy Rx and received 0.88mL of G-W-T-C-D-DM in the RUA and 0.19mL of MOLDS-RW in the LUA. Patient waited 30 minutes in office and did not experience any issues.    Ravneet Spilker Fernandez-Vernon 09/15/2023, 2:10 PM

## 2023-09-17 ENCOUNTER — Ambulatory Visit: Payer: 59 | Admitting: Nurse Practitioner

## 2023-09-17 ENCOUNTER — Encounter: Payer: Self-pay | Admitting: Nurse Practitioner

## 2023-09-17 ENCOUNTER — Other Ambulatory Visit: Payer: 59

## 2023-09-17 ENCOUNTER — Ambulatory Visit: Payer: 59 | Admitting: Internal Medicine

## 2023-09-17 VITALS — BP 132/76 | HR 86 | Ht 64.0 in | Wt 160.2 lb

## 2023-09-17 DIAGNOSIS — K5010 Crohn's disease of large intestine without complications: Secondary | ICD-10-CM

## 2023-09-17 DIAGNOSIS — R197 Diarrhea, unspecified: Secondary | ICD-10-CM

## 2023-09-17 DIAGNOSIS — K5900 Constipation, unspecified: Secondary | ICD-10-CM | POA: Diagnosis not present

## 2023-09-17 LAB — CBC WITH DIFFERENTIAL/PLATELET
Basophils Absolute: 0.1 10*3/uL (ref 0.0–0.1)
Basophils Relative: 0.8 % (ref 0.0–3.0)
Eosinophils Absolute: 0 10*3/uL (ref 0.0–0.7)
Eosinophils Relative: 0.2 % (ref 0.0–5.0)
HCT: 36.3 % (ref 36.0–46.0)
Hemoglobin: 11.1 g/dL — ABNORMAL LOW (ref 12.0–15.0)
Lymphocytes Relative: 14.3 % (ref 12.0–46.0)
Lymphs Abs: 2 10*3/uL (ref 0.7–4.0)
MCHC: 30.7 g/dL (ref 30.0–36.0)
MCV: 72.9 fL — ABNORMAL LOW (ref 78.0–100.0)
Monocytes Absolute: 1.2 10*3/uL — ABNORMAL HIGH (ref 0.1–1.0)
Monocytes Relative: 8.1 % (ref 3.0–12.0)
Neutro Abs: 10.8 10*3/uL — ABNORMAL HIGH (ref 1.4–7.7)
Neutrophils Relative %: 76.6 % (ref 43.0–77.0)
Platelets: 476 10*3/uL — ABNORMAL HIGH (ref 150.0–400.0)
RBC: 4.98 Mil/uL (ref 3.87–5.11)
RDW: 19.1 % — ABNORMAL HIGH (ref 11.5–15.5)
WBC: 14.2 10*3/uL — ABNORMAL HIGH (ref 4.0–10.5)

## 2023-09-17 LAB — COMPREHENSIVE METABOLIC PANEL
ALT: 29 U/L (ref 0–35)
AST: 18 U/L (ref 0–37)
Albumin: 4.5 g/dL (ref 3.5–5.2)
Alkaline Phosphatase: 116 U/L (ref 39–117)
BUN: 12 mg/dL (ref 6–23)
CO2: 26 meq/L (ref 19–32)
Calcium: 9.9 mg/dL (ref 8.4–10.5)
Chloride: 103 meq/L (ref 96–112)
Creatinine, Ser: 0.75 mg/dL (ref 0.40–1.20)
GFR: 84.27 mL/min (ref >60.00)
Glucose, Bld: 165 mg/dL — ABNORMAL HIGH (ref 70–99)
Potassium: 3.9 meq/L (ref 3.5–5.1)
Sodium: 137 meq/L (ref 135–145)
Total Bilirubin: 0.3 mg/dL (ref 0.2–1.2)
Total Protein: 7.7 g/dL (ref 6.0–8.3)

## 2023-09-17 LAB — C-REACTIVE PROTEIN: CRP: <1 mg/dL (ref 0.5–20.0)

## 2023-09-17 MED ORDER — MESALAMINE 1.2 G PO TBEC: 4.8000 g | DELAYED_RELEASE_TABLET | Freq: Every day | ORAL | 1 refills | Status: DC

## 2023-09-17 NOTE — Progress Notes (Signed)
09/17/2023 Sheila Vincent [id number] 1961/05/24   Chief Complaint: Bloating   History of Present Illness: Sheila Vincent is a 62 year old female with a past medical history of anxiety, depression, arthritis, hypertension, asthma, kidney stones GERD, IDA and Crohn's disease then diagnosed switched to ulcerative colitis. Previously followed by [facility] then by Dr. Orvan Falconer. She presents today with concerns about having possible Crohn's flare symptoms. She endorses having abdominal bloat for the past 2 months. About 2 months ago, she had nonbloody diarrhea 10 to 15 times daily with intermittent lower abdominal pain for 2 weeks.  Her diarrhea abated but she continued to have abdominal bloat which has persisted since then with constipation.  Her bowel pattern varies.  She passes a solid bowel movement once or twice weekly but this morning had 1 episode of diarrhea.  Nausea or vomiting.  No recent antibiotics.  No GERD symptoms on Omeprazole 40 mg daily. She is not taking any laxatives.  She is prescribed mesalamine 1.2 g 4 tabs daily but takes it as needed.  She stated she last took mesalamine 2 tabs daily 2 months ago when she had nonbloody diarrhea for 2 weeks as noted above. She underwent EGD and colonoscopy 08/13/2021 by Dr. Orvan Falconer. The EGD showed mild gastritis without evidence of H. pylori.  Duodenal biopsies were negative for celiac disease. The colonoscopy identified pancolitis.  Path report showed mildly active chronic colitis and proctitis without granulomas or dysplasia, likely ulcerative colitis.  She was last seen in office by Dr. Orvan Falconer 08/05/2022 after having diarrhea which developed after initiating metformin.  At that time, she was instructed to continue Lialda and to start a fiber supplement. Dr. Orvan Falconer also discussed referral to [facility] if finanaces limit treatment options or if she would like to consider a second opinion.        Latest Ref Rng & Units 12/27/2022    8:18 AM  12/26/2022    3:39 AM 12/16/2022    2:30 PM  CBC  WBC 4.0 - 10.5 K/uL 13.8  15.0  18.1   Hemoglobin 12.0 - 15.0 g/dL 9.0  9.4  87.5   Hematocrit 36.0 - 46.0 % 29.5  30.1  41.9   Platelets 150 - 400 K/uL 316  330  499         Latest Ref Rng & Units 12/26/2022    3:39 AM 12/16/2022    2:30 PM 11/14/2022    2:28 PM  CMP  Glucose 70 - 99 mg/dL 643  94  97   BUN 8 - 23 mg/dL 9  18  13    Creatinine 0.44 - 1.00 mg/dL 3.29  5.18  8.41   Sodium 135 - 145 mmol/L 136  140  142   Potassium 3.5 - 5.1 mmol/L 4.1  4.9  5.1   Chloride 98 - 111 mmol/L 107  106  105   CO2 22 - 32 mmol/L 22  22  22    Calcium 8.9 - 10.3 mg/dL 8.3  9.8  66.0   Total Protein 6.0 - 8.5 g/dL   7.3   Total Bilirubin 0.0 - 1.2 mg/dL   0.4   Alkaline Phos 44 - 121 IU/L   106   AST 0 - 40 IU/L   20   ALT 0 - 32 IU/L   20     EGD 08/13/2021: - Normal esophagus. - Normal stomach. Biopsied. - Mucosal changes in the duodenum. Biopsied. - The examination was otherwise normal.  Colonoscopy 08/13/2021: - Diffuse moderate inflammation was found in the entire examined colon secondary to pancolitis. Biopsied. - The examined portion of the ileum was normal. - The examination was otherwise normal on direct and retroflexion views.  1. Surgical [P], duodenal - DUODENAL MUCOSA WITH NO SPECIFIC HISTOPATHOLOGIC CHANGES - NEGATIVE FOR INCREASED INTRAEPITHELIAL LYMPHOCYTES OR VILLOUS ARCHITECTURAL CHANGES 2. Surgical [P], fundus, gastric antrum, and gastric body - GASTRIC ANTRAL AND OXYNTIC MUCOSA WITH MILD CHRONIC GASTRITIS - GASTRIC OXYNTIC MUCOSA WITH PARIETAL CELL HYPERPLASIA AS CAN BE SEEN IN HYPERGASTRINEMIC STATES SUCH AS PPI THERAPY. - WARTHIN STARRY STAIN IS NEGATIVE FOR HELICOBACTER PYLORI 3. Surgical [P], colon, terminal ileum - ILEAL MUCOSA WITH NO SPECIFIC HISTOPATHOLOGIC CHANGES - NEGATIVE FOR ACUTE INFLAMMATION, FEATURES OF CHRONICITY OR GRANULOMAS 4. Surgical [P], right colon - MILDLY ACTIVE CHRONIC COLITIS, SEE  NOTE - NEGATIVE FOR GRANULOMAS OR DYSPLASIA 5. Surgical [P], colon, transverse - MILDLY ACTIVE CHRONIC COLITIS, SEE NOTE - NEGATIVE FOR GRANULOMAS OR DYSPLASIA 6. Surgical [P], left colon bx's - MILDLY ACTIVE CHRONIC COLITIS, SEE NOTE - NEGATIVE FOR GRANULOMAS OR DYSPLASIA 7. Surgical [P], colon, rectum - MILDLY ACTIVE CHRONIC PROCTITIS, SEE NOTE - NEGATIVE FOR GRANULOMAS OR DYSPLASIA   Current Outpatient Medications on File Prior to Visit  Medication Sig Dispense Refill   albuterol (VENTOLIN HFA) 108 (90 Base) MCG/ACT inhaler INHALE 2 PUFFS INTO THE LUNGS EVERY 6 HOURS AS NEEDED FOR WHEEZING OR SHORTNESS OF BREATH 8.5 g 1   amLODipine (NORVASC) 5 MG tablet Take 5 mg by mouth daily.     atorvastatin (LIPITOR) 40 MG tablet Take 40 mg by mouth at bedtime.     azelastine (ASTELIN) 0.1 % nasal spray PLACE 2 SPRAY IN EACH NOSTRIL TWICE DAILY AS DIRECTED 90 mL 1   Blood Glucose Monitoring Suppl (ACCU-CHEK GUIDE) w/Device KIT USE TO CHECK BLOOD SUGAR DAILY     ezetimibe (ZETIA) 10 MG tablet TAKE 1 TABLET(10 MG) BY MOUTH DAILY 90 tablet 0   fenofibrate (TRICOR) 145 MG tablet TAKE 1 TABLET(145 MG) BY MOUTH DAILY 90 tablet 3   FERRETTS 325 (106 Fe) MG TABS tablet Take 1 tablet by mouth daily.     fexofenadine (ALLEGRA) 180 MG tablet Take 180 mg by mouth daily as needed for allergies.     fluticasone (FLOVENT HFA) 110 MCG/ACT inhaler Inhale 2 puffs into the lungs as needed.     metFORMIN (GLUCOPHAGE) 500 MG tablet Take 500 mg by mouth 2 (two) times daily.     montelukast (SINGULAIR) 10 MG tablet Take 1 tablet (10 mg total) by mouth at bedtime. 90 tablet 1   mupirocin ointment (BACTROBAN) 2 % Apply 1 Application topically as needed.     olmesartan (BENICAR) 20 MG tablet Take 20 mg by mouth daily.     omeprazole (PRILOSEC) 40 MG capsule Take 1 capsule (40 mg total) by mouth daily. 90 capsule 1   cetirizine (ZYRTEC) 10 MG tablet Take 1 tablet (10 mg total) by mouth in the morning and at bedtime. 180  tablet 1   levocetirizine (XYZAL) 5 MG tablet Take 1 tablet (5 mg total) by mouth every evening. 90 tablet 1   No current facility-administered medications on file prior to visit.   Allergies  Allergen Reactions   Sulfa Antibiotics     Nervous cannot sit still, paranoid   Sulfasalazine Other (See Comments)    Intolerance to sulfa   Bactrim Anxiety    Nervous,"cant sit still", paranoid   Current Medications, Allergies, Past Medical  History, Past Surgical History, Family History and Social History were reviewed in [employer] record.  Review of Systems:   Constitutional: Negative for fever, sweats, chills or weight loss.  Respiratory: Negative for shortness of breath.   Cardiovascular: Negative for chest pain, palpitations and leg swelling.  Gastrointestinal: See HPI.  Musculoskeletal: Negative for back pain or muscle aches.  Neurological: Negative for dizziness, headaches or paresthesias.   Physical Exam: There were no vitals taken for this visit. BP 132/76   Pulse 86   Ht 5\' 4"  (1.626 m)   Wt 160 lb 4 oz (72.7 kg)   BMI 27.51 kg/m   Wt Readings from Last 3 Encounters:  09/17/23 160 lb 4 oz (72.7 kg)  08/04/23 158 lb 11.2 oz (72 kg)  05/22/23 145 lb (65.8 kg)    General: 62 year old female in no acute distress. Head: Normocephalic and atraumatic. Eyes: No scleral icterus. Conjunctiva pink . Ears: Normal auditory acuity. Mouth: Dentition intact. No ulcers or lesions.  Lungs: Clear throughout to auscultation. Heart: Regular rate and rhythm, no murmur. Abdomen: Soft. Moderate abdominal distension. Nontender. No masses or hepatomegaly. Normal bowel sounds x 4 quadrants.  Rectal: Deferred.  Musculoskeletal: Symmetrical with no gross deformities. Extremities: No edema. Neurological: Alert oriented x 4. No focal deficits.  Psychological: Alert and cooperative. Normal mood and affect  Assessment and Recommendations:  62 year old female with a prior  diagnosis of Crohn's disease, diagnosis subsequently switched to ulcerative colitis following colonoscopy 08/2021. Patient presents with diarrhea and intermittent lower abdominal pain x 2 to 3 weeks two months ago, followed by abdominal bloat, constipation and had diarrhea x 1 today. -CBC, CMP, CRP, [facility] GI pathogen panel and C. difficile and fecal calprotectin level -Patient instructed to take Mesalamine 1.2 g 4 tabs every day and not as needed -Abdominal x-ray to rule out partial SBO -Miralax Q HS as needed for constipation -Patient instructed to go to the emergency room if she develops vomiting or significant abdominal pain  GERD -Continue Omeprazole 40 mg daily  IDA, secondary to IBD Continue ferrous fumarate 325 mg daily

## 2023-09-17 NOTE — Patient Instructions (Addendum)
Your provider has ordered "[facility]" stool testing for you. You have received a kit from our office today containing all necessary supplies to complete this test. Please carefully read the stool collection instructions provided in the kit before opening the accompanying materials. In addition, be sure to place the label from the top right corner of the laboratory request sheet onto the "puritan opti-swab" tube that is supplied in the kit. This label should include your full name and date of birth. After completing the test, you should secure the purtian tube into the specimen biohazard bag. The laboratory request information sheet (including date and time of specimen collection) should be placed into the outside pocket of the specimen biohazard bag and returned to the [facility] lab with 2 days of collection.   If the laboratory information sheet specimen date and time are not filled out, the test will NOT be performed.  Your provider has requested that you go to the basement level for lab work before leaving today. Press "B" on the elevator. The lab is located at the first door on the left as you exit the elevator.  Go to the emergency room if you develop vomiting or significant abdominal pain.  Due to recent changes in healthcare laws, you may see the results of your imaging and laboratory studies on MyChart before your provider has had a chance to review them.  We understand that in some cases there may be results that are confusing or concerning to you. Not all laboratory results come back in the same time frame and the provider may be waiting for multiple results in order to interpret others.  Please give [place] 48 hours in order for your provider to thoroughly review all the results before contacting the office for clarification of your results. ' ' Thank you for trusting me with your gastrointestinal care!   Alcide Evener, CRNP

## 2023-09-22 ENCOUNTER — Ambulatory Visit: Payer: 59 | Admitting: Allergy & Immunology

## 2023-09-22 ENCOUNTER — Ambulatory Visit (INDEPENDENT_AMBULATORY_CARE_PROVIDER_SITE_OTHER): Payer: 59 | Admitting: *Deleted

## 2023-09-22 ENCOUNTER — Ambulatory Visit: Payer: 59

## 2023-09-22 ENCOUNTER — Telehealth: Payer: Self-pay | Admitting: Nurse Practitioner

## 2023-09-22 ENCOUNTER — Other Ambulatory Visit: Payer: 59

## 2023-09-22 DIAGNOSIS — R197 Diarrhea, unspecified: Secondary | ICD-10-CM

## 2023-09-22 DIAGNOSIS — J309 Allergic rhinitis, unspecified: Secondary | ICD-10-CM

## 2023-09-22 DIAGNOSIS — K5900 Constipation, unspecified: Secondary | ICD-10-CM

## 2023-09-22 DIAGNOSIS — K5010 Crohn's disease of large intestine without complications: Secondary | ICD-10-CM

## 2023-09-22 NOTE — Telephone Encounter (Signed)
PT returning call to discuss lab results. Please advise. 

## 2023-09-23 ENCOUNTER — Ambulatory Visit (INDEPENDENT_AMBULATORY_CARE_PROVIDER_SITE_OTHER)
Admission: RE | Admit: 2023-09-23 | Discharge: 2023-09-23 | Disposition: A | Discharge status: Home / Self Care | Payer: 59 | Source: Ambulatory Visit | Attending: Nurse Practitioner | Admitting: Nurse Practitioner

## 2023-09-23 DIAGNOSIS — K5010 Crohn's disease of large intestine without complications: Secondary | ICD-10-CM | POA: Diagnosis not present

## 2023-09-23 DIAGNOSIS — R197 Diarrhea, unspecified: Secondary | ICD-10-CM

## 2023-09-23 DIAGNOSIS — K5900 Constipation, unspecified: Secondary | ICD-10-CM | POA: Diagnosis not present

## 2023-09-23 NOTE — Telephone Encounter (Signed)
Spoke with Pt: Documented in result notes: Pt verbalized understanding with all questions answered.   

## 2023-09-24 ENCOUNTER — Ambulatory Visit (INDEPENDENT_AMBULATORY_CARE_PROVIDER_SITE_OTHER): Payer: 59

## 2023-09-24 DIAGNOSIS — J309 Allergic rhinitis, unspecified: Secondary | ICD-10-CM | POA: Diagnosis not present

## 2023-09-24 LAB — CALPROTECTIN, FECAL: Calprotectin, Fecal: 25 ug/g (ref 0–120)

## 2023-09-28 ENCOUNTER — Ambulatory Visit (INDEPENDENT_AMBULATORY_CARE_PROVIDER_SITE_OTHER): Payer: 59 | Admitting: *Deleted

## 2023-09-28 DIAGNOSIS — J309 Allergic rhinitis, unspecified: Secondary | ICD-10-CM | POA: Diagnosis not present

## 2023-09-29 ENCOUNTER — Telehealth: Payer: Self-pay

## 2023-09-29 NOTE — Telephone Encounter (Signed)
Contacted patient & patient is aware of negative [facility] c diff stool test.

## 2023-09-30 ENCOUNTER — Ambulatory Visit (INDEPENDENT_AMBULATORY_CARE_PROVIDER_SITE_OTHER): Payer: 59

## 2023-09-30 DIAGNOSIS — J309 Allergic rhinitis, unspecified: Secondary | ICD-10-CM | POA: Diagnosis not present

## 2023-10-05 ENCOUNTER — Ambulatory Visit (INDEPENDENT_AMBULATORY_CARE_PROVIDER_SITE_OTHER): Payer: 59 | Admitting: *Deleted

## 2023-10-05 DIAGNOSIS — J309 Allergic rhinitis, unspecified: Secondary | ICD-10-CM

## 2023-10-07 ENCOUNTER — Other Ambulatory Visit: Payer: Self-pay | Admitting: Allergy & Immunology

## 2023-10-07 ENCOUNTER — Ambulatory Visit (INDEPENDENT_AMBULATORY_CARE_PROVIDER_SITE_OTHER): Payer: 59

## 2023-10-07 DIAGNOSIS — J309 Allergic rhinitis, unspecified: Secondary | ICD-10-CM

## 2023-10-12 ENCOUNTER — Ambulatory Visit (INDEPENDENT_AMBULATORY_CARE_PROVIDER_SITE_OTHER): Payer: 59

## 2023-10-12 DIAGNOSIS — J309 Allergic rhinitis, unspecified: Secondary | ICD-10-CM | POA: Diagnosis not present

## 2023-10-14 ENCOUNTER — Ambulatory Visit (INDEPENDENT_AMBULATORY_CARE_PROVIDER_SITE_OTHER): Payer: 59

## 2023-10-14 DIAGNOSIS — J309 Allergic rhinitis, unspecified: Secondary | ICD-10-CM | POA: Diagnosis not present

## 2023-10-19 ENCOUNTER — Ambulatory Visit (INDEPENDENT_AMBULATORY_CARE_PROVIDER_SITE_OTHER): Payer: 59 | Admitting: *Deleted

## 2023-10-19 DIAGNOSIS — J309 Allergic rhinitis, unspecified: Secondary | ICD-10-CM | POA: Diagnosis not present

## 2023-10-21 ENCOUNTER — Ambulatory Visit (INDEPENDENT_AMBULATORY_CARE_PROVIDER_SITE_OTHER): Payer: 59 | Admitting: *Deleted

## 2023-10-21 DIAGNOSIS — J309 Allergic rhinitis, unspecified: Secondary | ICD-10-CM

## 2023-10-26 ENCOUNTER — Ambulatory Visit (INDEPENDENT_AMBULATORY_CARE_PROVIDER_SITE_OTHER): Payer: 59

## 2023-10-26 DIAGNOSIS — J309 Allergic rhinitis, unspecified: Secondary | ICD-10-CM

## 2023-11-02 ENCOUNTER — Ambulatory Visit (INDEPENDENT_AMBULATORY_CARE_PROVIDER_SITE_OTHER): Payer: 59 | Admitting: *Deleted

## 2023-11-02 DIAGNOSIS — J309 Allergic rhinitis, unspecified: Secondary | ICD-10-CM

## 2023-11-06 ENCOUNTER — Ambulatory Visit (INDEPENDENT_AMBULATORY_CARE_PROVIDER_SITE_OTHER): Payer: 59

## 2023-11-06 DIAGNOSIS — J309 Allergic rhinitis, unspecified: Secondary | ICD-10-CM

## 2023-11-09 ENCOUNTER — Ambulatory Visit (INDEPENDENT_AMBULATORY_CARE_PROVIDER_SITE_OTHER): Payer: 59 | Admitting: *Deleted

## 2023-11-09 DIAGNOSIS — J309 Allergic rhinitis, unspecified: Secondary | ICD-10-CM

## 2023-11-10 ENCOUNTER — Ambulatory Visit (INDEPENDENT_AMBULATORY_CARE_PROVIDER_SITE_OTHER): Payer: 59 | Admitting: Allergy & Immunology

## 2023-11-10 ENCOUNTER — Encounter: Payer: Self-pay | Admitting: Allergy & Immunology

## 2023-11-10 ENCOUNTER — Other Ambulatory Visit: Payer: Self-pay

## 2023-11-10 ENCOUNTER — Ambulatory Visit: Payer: 59 | Admitting: Allergy & Immunology

## 2023-11-10 VITALS — BP 126/70 | HR 105 | Temp 98.4°F | Resp 20 | Ht 63.0 in | Wt 157.7 lb

## 2023-11-10 DIAGNOSIS — J4540 Moderate persistent asthma, uncomplicated: Secondary | ICD-10-CM

## 2023-11-10 DIAGNOSIS — J3489 Other specified disorders of nose and nasal sinuses: Secondary | ICD-10-CM | POA: Diagnosis not present

## 2023-11-10 DIAGNOSIS — K219 Gastro-esophageal reflux disease without esophagitis: Secondary | ICD-10-CM | POA: Diagnosis not present

## 2023-11-10 DIAGNOSIS — J302 Other seasonal allergic rhinitis: Secondary | ICD-10-CM | POA: Diagnosis not present

## 2023-11-10 DIAGNOSIS — J3089 Other allergic rhinitis: Secondary | ICD-10-CM | POA: Diagnosis not present

## 2023-11-10 MED ORDER — AZELASTINE HCL 0.1 % NA SOLN: NASAL | 1 refills | Status: DC

## 2023-11-10 MED ORDER — MONTELUKAST SODIUM 10 MG PO TABS: 10.0000 mg | ORAL_TABLET | Freq: Every day | ORAL | 1 refills | Status: DC

## 2023-11-10 MED ORDER — LEVOCETIRIZINE DIHYDROCHLORIDE 5 MG PO TABS: 5.0000 mg | ORAL_TABLET | Freq: Every evening | ORAL | 1 refills | Status: DC

## 2023-11-10 MED ORDER — MUPIROCIN 2 % EX OINT: 1.0000 | TOPICAL_OINTMENT | Freq: Two times a day (BID) | CUTANEOUS | 3 refills | Status: AC

## 2023-11-10 MED ORDER — FLUTICASONE PROPIONATE 50 MCG/ACT NA SUSP: 2.0000 | Freq: Every day | NASAL | 1 refills | Status: DC

## 2023-11-10 MED ORDER — CETIRIZINE HCL 10 MG PO TABS: 10.0000 mg | ORAL_TABLET | Freq: Two times a day (BID) | ORAL | 1 refills | Status: DC
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
## 2023-11-12 ENCOUNTER — Encounter: Payer: Self-pay | Admitting: Allergy & Immunology

## 2023-11-16 ENCOUNTER — Ambulatory Visit (INDEPENDENT_AMBULATORY_CARE_PROVIDER_SITE_OTHER): Payer: 59

## 2023-11-16 DIAGNOSIS — J309 Allergic rhinitis, unspecified: Secondary | ICD-10-CM | POA: Diagnosis not present

## 2023-11-18 ENCOUNTER — Ambulatory Visit (INDEPENDENT_AMBULATORY_CARE_PROVIDER_SITE_OTHER): Payer: 59 | Admitting: *Deleted

## 2023-11-18 DIAGNOSIS — J309 Allergic rhinitis, unspecified: Secondary | ICD-10-CM | POA: Diagnosis not present

## 2023-11-23 ENCOUNTER — Ambulatory Visit (INDEPENDENT_AMBULATORY_CARE_PROVIDER_SITE_OTHER): Payer: 59

## 2023-11-23 DIAGNOSIS — J309 Allergic rhinitis, unspecified: Secondary | ICD-10-CM

## 2023-11-30 ENCOUNTER — Ambulatory Visit (INDEPENDENT_AMBULATORY_CARE_PROVIDER_SITE_OTHER): Payer: 59

## 2023-11-30 DIAGNOSIS — J309 Allergic rhinitis, unspecified: Secondary | ICD-10-CM | POA: Diagnosis not present

## 2023-12-04 ENCOUNTER — Ambulatory Visit: Payer: 59 | Admitting: Cardiology

## 2023-12-09 ENCOUNTER — Ambulatory Visit (INDEPENDENT_AMBULATORY_CARE_PROVIDER_SITE_OTHER): Payer: 59

## 2023-12-09 DIAGNOSIS — J309 Allergic rhinitis, unspecified: Secondary | ICD-10-CM | POA: Diagnosis not present

## 2023-12-10 ENCOUNTER — Ambulatory Visit: Payer: 59 | Admitting: Allergy & Immunology

## 2023-12-10 ENCOUNTER — Encounter: Payer: Self-pay | Admitting: Allergy & Immunology

## 2023-12-10 ENCOUNTER — Other Ambulatory Visit: Payer: Self-pay

## 2023-12-10 VITALS — BP 140/84 | HR 84 | Temp 98.1°F | Resp 18

## 2023-12-10 DIAGNOSIS — J3489 Other specified disorders of nose and nasal sinuses: Secondary | ICD-10-CM | POA: Diagnosis not present

## 2023-12-10 DIAGNOSIS — J3089 Other allergic rhinitis: Secondary | ICD-10-CM | POA: Diagnosis not present

## 2023-12-10 DIAGNOSIS — J302 Other seasonal allergic rhinitis: Secondary | ICD-10-CM | POA: Diagnosis not present

## 2023-12-10 DIAGNOSIS — K219 Gastro-esophageal reflux disease without esophagitis: Secondary | ICD-10-CM | POA: Diagnosis not present

## 2023-12-10 DIAGNOSIS — J4540 Moderate persistent asthma, uncomplicated: Secondary | ICD-10-CM

## 2023-12-10 DIAGNOSIS — R067 Sneezing: Secondary | ICD-10-CM
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
## 2023-12-11 ENCOUNTER — Ambulatory Visit (HOSPITAL_COMMUNITY)
Admission: EM | Admit: 2023-12-11 | Discharge: 2023-12-11 | Disposition: A | Discharge status: Home / Self Care | Payer: 59 | Attending: Emergency Medicine | Admitting: Emergency Medicine

## 2023-12-11 ENCOUNTER — Encounter (HOSPITAL_COMMUNITY): Payer: Self-pay | Admitting: Emergency Medicine

## 2023-12-11 ENCOUNTER — Ambulatory Visit: Payer: Self-pay

## 2023-12-11 DIAGNOSIS — R600 Localized edema: Secondary | ICD-10-CM | POA: Diagnosis present

## 2023-12-11 LAB — CBC WITH DIFFERENTIAL/PLATELET
Abs Immature Granulocytes: 0.04 10*3/uL (ref 0.00–0.07)
Basophils Absolute: 0.1 10*3/uL (ref 0.0–0.1)
Basophils Relative: 1 %
Eosinophils Absolute: 0.2 10*3/uL (ref 0.0–0.5)
Eosinophils Relative: 2 %
HCT: 31.8 % — ABNORMAL LOW (ref 36.0–46.0)
Hemoglobin: 9.8 g/dL — ABNORMAL LOW (ref 12.0–15.0)
Immature Granulocytes: 0 %
Lymphocytes Relative: 25 %
Lymphs Abs: 2.4 10*3/uL (ref 0.7–4.0)
MCH: 24 pg — ABNORMAL LOW (ref 26.0–34.0)
MCHC: 30.8 g/dL (ref 30.0–36.0)
MCV: 77.8 fL — ABNORMAL LOW (ref 80.0–100.0)
Monocytes Absolute: 0.9 10*3/uL (ref 0.1–1.0)
Monocytes Relative: 9 %
Neutro Abs: 5.9 10*3/uL (ref 1.7–7.7)
Neutrophils Relative %: 63 %
Platelets: 530 10*3/uL — ABNORMAL HIGH (ref 150–400)
RBC: 4.09 MIL/uL (ref 3.87–5.11)
RDW: 15.2 % (ref 11.5–15.5)
WBC: 9.5 10*3/uL (ref 4.0–10.5)
nRBC: 0 % (ref 0.0–0.2)

## 2023-12-11 LAB — COMPREHENSIVE METABOLIC PANEL
ALT: 25 U/L (ref 0–44)
AST: 24 U/L (ref 15–41)
Albumin: 3.8 g/dL (ref 3.5–5.0)
Alkaline Phosphatase: 94 U/L (ref 38–126)
Anion gap: 11 (ref 5–15)
BUN: 7 mg/dL — ABNORMAL LOW (ref 8–23)
CO2: 22 mmol/L (ref 22–32)
Calcium: 8.9 mg/dL (ref 8.9–10.3)
Chloride: 106 mmol/L (ref 98–111)
Creatinine, Ser: 0.71 mg/dL (ref 0.44–1.00)
GFR, Estimated: >60 mL/min (ref >60)
Glucose, Bld: 172 mg/dL — ABNORMAL HIGH (ref 70–99)
Potassium: 3.8 mmol/L (ref 3.5–5.1)
Sodium: 139 mmol/L (ref 135–145)
Total Bilirubin: 0.4 mg/dL (ref 0.0–1.2)
Total Protein: 6.7 g/dL (ref 6.5–8.1)

## 2023-12-11 LAB — BRAIN NATRIURETIC PEPTIDE: B Natriuretic Peptide: 50.3 pg/mL (ref 0.0–100.0)
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
## 2023-12-11 NOTE — Discharge Instructions (Addendum)
 Please elevate your legs and buy some over-the-counter compression stockings to help with the swelling.  Avoid excess fluid and salt.  We have checked some basic labs and I will contact you if anything requires follow-up.  If the swelling persist please follow-up with your primary care provider on Monday.  Seek immediate care at the nearest emergency department if you develop any chest pain, shortness of breath, or new concerning symptoms.

## 2023-12-15 ENCOUNTER — Ambulatory Visit (INDEPENDENT_AMBULATORY_CARE_PROVIDER_SITE_OTHER): Payer: 59 | Admitting: *Deleted

## 2023-12-15 DIAGNOSIS — J309 Allergic rhinitis, unspecified: Secondary | ICD-10-CM

## 2023-12-21 ENCOUNTER — Ambulatory Visit (INDEPENDENT_AMBULATORY_CARE_PROVIDER_SITE_OTHER): Payer: 59

## 2023-12-21 DIAGNOSIS — J309 Allergic rhinitis, unspecified: Secondary | ICD-10-CM

## 2023-12-27 ENCOUNTER — Encounter (HOSPITAL_COMMUNITY): Payer: Self-pay | Admitting: Emergency Medicine

## 2023-12-27 ENCOUNTER — Ambulatory Visit (HOSPITAL_COMMUNITY)
Admission: EM | Admit: 2023-12-27 | Discharge: 2023-12-27 | Disposition: A | Discharge status: Home / Self Care | Payer: 59 | Attending: Physician Assistant | Admitting: Physician Assistant

## 2023-12-27 ENCOUNTER — Other Ambulatory Visit: Payer: Self-pay

## 2023-12-27 DIAGNOSIS — J0100 Acute maxillary sinusitis, unspecified: Secondary | ICD-10-CM

## 2023-12-27 MED ORDER — DOXYCYCLINE HYCLATE 100 MG PO CAPS: 100.0000 mg | ORAL_CAPSULE | Freq: Two times a day (BID) | ORAL | 0 refills | Status: DC
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
## 2023-12-27 NOTE — ED Triage Notes (Signed)
 Pt states she is been having nasal congestion, fatigue, sore throat and hoarse voice for the past 2 weeks using OTC meds with no relief.

## 2023-12-27 NOTE — Discharge Instructions (Signed)
 We are treating you for sinus infection.  Start doxycycline 100 mg twice daily for 10 days.  Stay out of the sun while you are on this medication.  Continue your allergy and asthma medication.  I also recommend Mucinex and nasal saline/sinus rinses.  Make sure you rest and drink plenty of fluid.  If your symptoms are not improving within a week or if anything worsens and you have worsening cough, fever, chest pain, shortness of breath, nausea/vomiting interfering with oral intake you need to be seen immediately.  Follow-up with your primary care.

## 2023-12-30 ENCOUNTER — Ambulatory Visit (INDEPENDENT_AMBULATORY_CARE_PROVIDER_SITE_OTHER): Payer: 59 | Admitting: Orthopaedic Surgery

## 2023-12-30 ENCOUNTER — Other Ambulatory Visit (INDEPENDENT_AMBULATORY_CARE_PROVIDER_SITE_OTHER): Payer: 59

## 2023-12-30 DIAGNOSIS — M25552 Pain in left hip: Secondary | ICD-10-CM

## 2023-12-30 DIAGNOSIS — Z96641 Presence of right artificial hip joint: Secondary | ICD-10-CM

## 2023-12-30 NOTE — Progress Notes (Signed)
 The patient is a 63 year old female who is now a year out from a right total hip arthroplasty.  She has been having some thigh pain on the right side but overall it is doing well.  She has been having some left hip pain but she points to the pelvis and the ASIS area as a source of her pain on the left side.  She denies any groin pain the left side.  Both hips move smoothly and fluidly with no blocks to rotation.  Her left hip does not hurt on range of motion.  She does hurt to palpation over the oblique muscles and the insertion of muscles at the ASIS and the rim of the pelvis on that side.  An AP pelvis and lateral both hips shows a well-seated right total hip arthroplasty.  The left hip joint space is well-maintained.  I did not see any cortical irregularities around the pelvis on that left side.  I gave her reassurance that it does not look like she needs a left hip replacement.  However I would like to send her to my partner Dr. Shon Baton so he can evaluate this area around the ASIS and her pelvis and to see if she would benefit from soft tissue treatments in this area.  We will work on getting that appointment with Dr. Shon Baton.  Follow-up with me will be as needed.

## 2024-01-04 ENCOUNTER — Telehealth: Payer: Self-pay | Admitting: *Deleted

## 2024-01-04 ENCOUNTER — Ambulatory Visit (INDEPENDENT_AMBULATORY_CARE_PROVIDER_SITE_OTHER): Admitting: *Deleted

## 2024-01-04 DIAGNOSIS — J309 Allergic rhinitis, unspecified: Secondary | ICD-10-CM | POA: Diagnosis not present

## 2024-01-04 NOTE — Telephone Encounter (Signed)
 Patient came in for her allergy injections and states that she just got over a sinus infection last week with antibiotics and states that she had about 3-4 sinus infections last year. She states that once she starts sneezing a lot is when a sinus infection seems to occur. She states that she is taking all of her medications as prescribed and is wondering what to do.

## 2024-01-06 NOTE — Telephone Encounter (Signed)
 I called patient and scheduled with a NP.

## 2024-01-06 NOTE — Telephone Encounter (Signed)
 Can she make an appointment? OK to schedule with NP.   Malachi Bonds, MD [facility]

## 2024-01-11 ENCOUNTER — Ambulatory Visit (INDEPENDENT_AMBULATORY_CARE_PROVIDER_SITE_OTHER)

## 2024-01-11 DIAGNOSIS — J309 Allergic rhinitis, unspecified: Secondary | ICD-10-CM

## 2024-01-14 ENCOUNTER — Other Ambulatory Visit (INDEPENDENT_AMBULATORY_CARE_PROVIDER_SITE_OTHER): Payer: Self-pay

## 2024-01-14 ENCOUNTER — Encounter: Payer: Self-pay | Admitting: Sports Medicine

## 2024-01-14 ENCOUNTER — Ambulatory Visit (INDEPENDENT_AMBULATORY_CARE_PROVIDER_SITE_OTHER): Payer: 59 | Admitting: Sports Medicine

## 2024-01-14 DIAGNOSIS — M25552 Pain in left hip: Secondary | ICD-10-CM | POA: Diagnosis not present

## 2024-01-14 DIAGNOSIS — M7622 Iliac crest spur, left hip: Secondary | ICD-10-CM | POA: Diagnosis not present

## 2024-01-14 DIAGNOSIS — R252 Cramp and spasm: Secondary | ICD-10-CM | POA: Diagnosis not present
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
## 2024-01-15 ENCOUNTER — Telehealth: Payer: Self-pay | Admitting: Sports Medicine

## 2024-01-15 MED ORDER — METHOCARBAMOL 750 MG PO TABS: 750.0000 mg | ORAL_TABLET | Freq: Two times a day (BID) | ORAL | 0 refills | Status: AC

## 2024-01-15 NOTE — Telephone Encounter (Signed)
 Called patient to let her know that prescription was sent to [facility] on [address]. She will call if they still have not received prescription.

## 2024-01-15 NOTE — Addendum Note (Signed)
 Addended by: Jamie Kato III on: 01/15/2024 03:28 PM   Modules accepted: Orders

## 2024-01-15 NOTE — Telephone Encounter (Signed)
 Pt had an appt yesterday with Dr Shon Baton and pt showed today stating Dr Shon Baton was to send in medications to help with cramping in bil leg. Please send medication ASAP to [facility]. Pt phone number is [phone]. Please call pt when sent in

## 2024-01-18 ENCOUNTER — Ambulatory Visit (INDEPENDENT_AMBULATORY_CARE_PROVIDER_SITE_OTHER): Admitting: *Deleted

## 2024-01-18 ENCOUNTER — Ambulatory Visit: Admitting: Family Medicine

## 2024-01-18 DIAGNOSIS — J309 Allergic rhinitis, unspecified: Secondary | ICD-10-CM

## 2024-01-19 ENCOUNTER — Ambulatory Visit (INDEPENDENT_AMBULATORY_CARE_PROVIDER_SITE_OTHER): Admitting: Allergy & Immunology

## 2024-01-19 ENCOUNTER — Other Ambulatory Visit: Payer: Self-pay

## 2024-01-19 VITALS — BP 130/82 | HR 88 | Temp 98.3°F

## 2024-01-19 DIAGNOSIS — E1169 Type 2 diabetes mellitus with other specified complication: Secondary | ICD-10-CM

## 2024-01-19 DIAGNOSIS — J4540 Moderate persistent asthma, uncomplicated: Secondary | ICD-10-CM

## 2024-01-19 DIAGNOSIS — T148XXD Other injury of unspecified body region, subsequent encounter: Secondary | ICD-10-CM | POA: Diagnosis not present

## 2024-01-19 DIAGNOSIS — K219 Gastro-esophageal reflux disease without esophagitis: Secondary | ICD-10-CM

## 2024-01-19 DIAGNOSIS — J3089 Other allergic rhinitis: Secondary | ICD-10-CM | POA: Diagnosis not present

## 2024-01-19 DIAGNOSIS — J302 Other seasonal allergic rhinitis: Secondary | ICD-10-CM

## 2024-01-19 DIAGNOSIS — B999 Unspecified infectious disease: Secondary | ICD-10-CM

## 2024-01-19 MED ORDER — MONTELUKAST SODIUM 10 MG PO TABS: 10.0000 mg | ORAL_TABLET | Freq: Every day | ORAL | 1 refills | Status: DC

## 2024-01-19 MED ORDER — CETIRIZINE HCL 10 MG PO TABS: 10.0000 mg | ORAL_TABLET | Freq: Two times a day (BID) | ORAL | 1 refills | Status: DC

## 2024-01-19 MED ORDER — TRIAMCINOLONE ACETONIDE 0.5 % EX OINT: TOPICAL_OINTMENT | CUTANEOUS | 0 refills | Status: DC

## 2024-01-19 MED ORDER — AMOXICILLIN-POT CLAVULANATE 875-125 MG PO TABS: 1.0000 | ORAL_TABLET | Freq: Two times a day (BID) | ORAL | 0 refills | Status: AC

## 2024-01-19 NOTE — Patient Instructions (Addendum)
 1. Moderate persistent asthma, uncomplicated - Spirometry looks great today.  - Daily controller medication(s): Singulair (montelukast) 10mg  daily - Prior to physical activity: albuterol 2 puffs 10-15 minutes before physical activity. - Rescue medications: albuterol 4 puffs every 4-6 hours as needed - Changes during respiratory infections or worsening symptoms: Add on Symbicort to 2 puffs twice daily for TWO WEEKS. - Asthma control goals:  * Full participation in all desired activities (may need albuterol before activity) * Albuterol use two time or less a week on average (not counting use with activity) * Cough interfering with sleep two time or less a month * Oral steroids no more than once a year * No hospitalizations  2. Perennial and seasonal allergic rhinitis - Continue with the azelastine (but do not use on dry days).  - Continue with Flonase every other day (this is better for dry noses).  - Continue with Bactroban as needed. - Continue with your cetirizine and the montelukast.  - Continue with the allergy shots at the same schedule.   3. Recurrent infections - We will obtain some screening labs to evaluate your immune system.  - Labs to evaluate the quantitative [facility]) aspects of your immune system: IgG/IgA/IgM, CBC with differential - Labs to evaluate the qualitative (HOW WELL THEY WORK) aspects of your immune system: CH50, Pneumococcal titers, Tetanus titers, Diphtheria titers - We may consider immunizations with Pneumovax and Tdap to challenge your immune system, and then obtain repeat titers in 4-6 weeks.   4. Chronic sinusitis - We are going to extend your Augmentin for another 7 days.  - I wrote a prescription for that. - Make an appointment to see Dr. Ernestene Kiel again [phone]).  5. Hand and finger dermatitis - with wound healing problems - This could be related to your diabetes. - This can predispose you to having issues with wound healing. - Start the  triamcinolone 2-3 times daily for the fingers to see if that helps with healing.   6. Diabetes  - We are going to get a hemoglobin A1c.  - We are also going to refer you to see Endocrinology so that they can look into managing your diabetes more effectively.   7. Return in about 6 months (around 07/21/2024). You can have the follow up appointment with Dr. Dellis Anes or a Nurse Practicioner (our Nurse Practitioners are excellent and always have Physician oversight!).    Please inform us of any Emergency Department visits, hospitalizations, or changes in symptoms. Call us before going to the ED for breathing or allergy symptoms since we might be able to fit you in for a sick visit. Feel free to contact us anytime with any questions, problems, or concerns.  It was a pleasure to see you again today!  Websites that have reliable patient information: 1. American Academy of Asthma, Allergy, and Immunology: www.aaaai.org 2. Food Allergy Research and Education (FARE): foodallergy.org 3. Mothers of Asthmatics: http://www.asthmacommunitynetwork.org 4. American College of Allergy, Asthma, and Immunology: www.acaai.org      "Like" [place] on Facebook and Instagram for our latest updates!      A healthy democracy works best when [employer] participate! Make sure you are registered to vote! If you have moved or changed any of your contact information, you will need to get this updated before voting! Scan the QR codes below to learn more!

## 2024-01-19 NOTE — Progress Notes (Unsigned)
 FOLLOW UP  Date of Service/Encounter:  01/19/24   Assessment:   Moderate persistent asthma, uncomplicated   Perennial and seasonal allergic rhinitis (grasses, ragweed, weeds, molds, cat, dog, dust mite) - on allergen immunotherapy (started June 2019 and reached maintenance in October 2019), restarted new vials in November 2024 and reached maintenance in January 2025   Chronic nasal congestion - with mostly normal sinus CT (consider migraine eval?)    Heart murmur and hypertension - followed by Cardiology   Iron deficiency anemia - followed by hematology   Inflammatory bowel disease - followed by Dr. Orvan Falconer   GERD   Chronic pain - with a hip surgery scheduled in late February 2024    Disabled status  Plan/Recommendations:   Assessment and Plan    Recurrent Sinusitis Recurrent sinus infections with partial relief from previous treatments. ENT suggested surgery, declined by patient. Current episode ongoing. - Prescribe 7 more days of Augmentin. - Refer to ENT for further evaluation.  Asthma Asthma exacerbates during sinusitis episodes, improving as sinusitis resolves. - Monitor asthma symptoms in relation to sinusitis treatment.  Diabetes Mellitus Poorly controlled diabetes with A1c over 7 and blood sugar up to 375 mg/dL. Managed with metformin. Poor control may affect wound healing. - Refer to endocrinology for diabetes management. - Consider referral to wound care if skin issues persist.  Skin Fissures Persistent painful finger fissures, possibly related to diabetes. No improvement with Lubriderm. - Prescribe triamcinolone cream for topical application on hands. - Monitor response to topical steroid before considering wound care referral.  Anemia Anemia with low iron levels. Previous supplementation without refills, effectiveness unknown. - Evaluate current iron levels and consider re-prescribing iron supplementation if needed.  Follow-up Requires follow-up  for sinusitis, diabetes, and skin conditions. Specialist coordination necessary. - Schedule follow-up appointment after completion of Augmentin course. - Coordinate with endocrinology and ENT for specialist follow-ups.         Patient Instructions  1. Moderate persistent asthma, uncomplicated - Spirometry looks great today.  - Daily controller medication(s): Singulair (montelukast) 10mg  daily - Prior to physical activity: albuterol 2 puffs 10-15 minutes before physical activity. - Rescue medications: albuterol 4 puffs every 4-6 hours as needed - Changes during respiratory infections or worsening symptoms: Add on Symbicort to 2 puffs twice daily for TWO WEEKS. - Asthma control goals:  * Full participation in all desired activities (may need albuterol before activity) * Albuterol use two time or less a week on average (not counting use with activity) * Cough interfering with sleep two time or less a month * Oral steroids no more than once a year * No hospitalizations  2. Perennial and seasonal allergic rhinitis - Continue with the azelastine (but do not use on dry days).  - Continue with Flonase every other day (this is better for dry noses).  - Continue with Bactroban as needed. - Continue with your cetirizine and the montelukast.  - Continue with the allergy shots at the same schedule.   3. Recurrent infections - We will obtain some screening labs to evaluate your immune system.  - Labs to evaluate the quantitative [facility]) aspects of your immune system: IgG/IgA/IgM, CBC with differential - Labs to evaluate the qualitative (HOW WELL THEY WORK) aspects of your immune system: CH50, Pneumococcal titers, Tetanus titers, Diphtheria titers - We may consider immunizations with Pneumovax and Tdap to challenge your immune system, and then obtain repeat titers in 4-6 weeks.   4. Chronic sinusitis - We are going to  extend your Augmentin for another 7 days.  - I wrote a prescription for  that. - Make an appointment to see Dr. Ernestene Kiel again [phone]).  5. Hand and finger dermatitis - with wound healing problems - This could be related to your diabetes. - This can predispose you to having issues with wound healing. - Start the triamcinolone 2-3 times daily for the fingers to see if that helps with healing.   6. Diabetes  - We are going to get a hemoglobin A1c.  - We are also going to refer you to see Endocrinology so that they can look into managing your diabetes more effectively.   7. Return in about 6 months (around 07/21/2024). You can have the follow up appointment with Dr. Dellis Anes or a Nurse Practicioner (our Nurse Practitioners are excellent and always have Physician oversight!).    Please inform us of any Emergency Department visits, hospitalizations, or changes in symptoms. Call us before going to the ED for breathing or allergy symptoms since we might be able to fit you in for a sick visit. Feel free to contact us anytime with any questions, problems, or concerns.  It was a pleasure to see you again today!  Websites that have reliable patient information: 1. American Academy of Asthma, Allergy, and Immunology: www.aaaai.org 2. Food Allergy Research and Education (FARE): foodallergy.org 3. Mothers of Asthmatics: http://www.asthmacommunitynetwork.org 4. American College of Allergy, Asthma, and Immunology: www.acaai.org      "Like" [place] on Facebook and Instagram for our latest updates!      A healthy democracy works best when [employer] participate! Make sure you are registered to vote! If you have moved or changed any of your contact information, you will need to get this updated before voting! Scan the QR codes below to learn more!            Subjective:   Maelys Kinnick is a 63 y.o. female presenting today for follow up of  Chief Complaint  Patient presents with   Nasal Congestion   Sinus Problem   Sinusitis    Davetta Olliff has a  history of the following: Patient Active Problem List   Diagnosis Date Noted   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/25/2022   Nasal vestibulitis 12/08/2022   Seasonal allergic rhinitis due to pollen 12/08/2022   Chronic rhinitis 11/27/2022   Deviated nasal septum 11/27/2022   Recurrent infections 09/22/2022   Acute bacterial sinusitis 09/08/2022   At increased risk of exposure to COVID-19 virus 09/08/2022   Gait abnormality 06/16/2022   Right hip pain 06/16/2022   Numbness of right foot 06/16/2022   Cerebral vascular disease 06/16/2022   Crohn's disease of large bowel (HCC) 06/05/2022   Diarrhea 06/05/2022   Rectal bleeding 06/05/2022   IBD (inflammatory bowel disease) 08/08/2021   Symptomatic anemia 08/08/2021   Iron deficiency anemia due to chronic blood loss 08/08/2021   Laryngopharyngeal reflux (LPR) 09/09/2019   Sudden right hearing loss 09/09/2019   Tinnitus of right ear 09/09/2019   Eustachian tube dysfunction, right 09/05/2019   Bug bite 04/21/2019   Heart murmur 04/21/2019   De Quervain's syndrome (tenosynovitis) 02/23/2019   Primary osteoarthritis of first carpometacarpal joint of left hand 10/11/2018   GERD (gastroesophageal reflux disease) 04/06/2018   Cough, persistent 04/06/2018   Hypertension 03/30/2018   Seasonal and perennial allergic rhinitis 07/14/2015   Moderate persistent asthma 07/14/2015   Upper airway cough syndrome 12/06/2014   Right foot pain 07/06/2014   Low back  pain 07/06/2014    History obtained from: chart review and patient.  Care team PCP: Rometta Emery, MD Orthopedic surgeon: Dr. Vira Browns Cardiology: Dr. Tessa Lerner Physical Therapist: Edwinna Areola Gastroenterologist: Dr. Tressia Danas Hematologist: Dr. Jeanie Sewer  Urologist: Dr. Sebastian Ache    Discussed the use of AI scribe software for clinical note transcription with the patient and/or guardian, who gave verbal consent to proceed.  Ameira is a 63 y.o.  female presenting for a follow up visit.  She was last seen in February 2025.  At that time, spirometry was not done.  We continue with Singulair 10 mg daily as well as albuterol as needed.  She has Symbicort that she had done during flares.  For her allergic rhinitis, she was reporting a lot of sneezing.  We recommended using a new nasal spray sample which we had called Zymase.  We also talked about using sinus plumber which contains extracts from pepper plants.  We continue with Astelin and Flonase as well as her allergy shots.  Discussed the use of AI scribe software for clinical note transcription with the patient, who gave verbal consent to proceed.  History of Present Illness   Alexah Kivett "Selena Batten" is a 63 year old female with recurrent sinus infections who presents with persistent symptoms despite treatment.  She experiences recurrent sinus infections that begin with sneezing and progress to congestion and hoarseness. Her last episode was in February, for which she was prescribed doxycycline and prednisone. While these medications help, they do not fully resolve her symptoms. Recently, she was prescribed Augmentin and prednisolone, but the symptoms persist. She has previously tried nasal sprays, including Bactroban, without relief. An ENT suggested surgery, but she declined. Frequent sneezing, sometimes up to ten times a day, seems to trigger her symptoms. No pneumonia, but asthma symptoms worsen with sinus issues, leading to mouth breathing due to nasal congestion.  She reports cuts and splits on her fingers that are painful and have been present for several weeks. These cuts start small and then split open. She uses Lubriderm for moisturizing but has not found relief. She has not used topical steroids for this issue. She is unsure if these symptoms were present during her last visit.  She has a history of diabetes, which is not well-controlled, with a hemoglobin A1c of over seven. She monitors  her blood sugar, which was recently 375 mg/dL. She takes metformin and a cholesterol medication, but her diabetes management is primarily overseen by her primary doctor. She has not seen an endocrinologist.  She mentions being anemic and having low iron levels, but she has only been prescribed iron once without refills. She is unsure if the iron supplementation was effective.        Asthma/Respiratory Symptom History: ***  Allergic Rhinitis Symptom History: ***  Food Allergy Symptom History: ***  Skin Symptom History: ***  GERD Symptom History: ***  Infection Symptom History: ***  Otherwise, there have been no changes to her past medical history, surgical history, family history, or social history.    Review of systems otherwise negative other than that mentioned in the HPI.    Objective:   Blood pressure 130/82, pulse 88, temperature 98.3 F (36.8 C), SpO2 99%. There is no height or weight on file to calculate BMI.    Physical Exam   Diagnostic studies:    Spirometry: results normal (FEV1: 2.19/92%, FVC: 2.79/92%, FEV1/FVC: 78%).    Spirometry consistent with normal pattern. {Blank single:19197::"Albuterol/Atrovent nebulizer","Xopenex/Atrovent nebulizer","Albuterol  nebulizer","Albuterol four puffs via MDI","Xopenex four puffs via MDI"} treatment given in clinic with {Blank single:19197::"significant improvement in FEV1 per ATS criteria","significant improvement in FVC per ATS criteria","significant improvement in FEV1 and FVC per ATS criteria","improvement in FEV1, but not significant per ATS criteria","improvement in FVC, but not significant per ATS criteria","improvement in FEV1 and FVC, but not significant per ATS criteria","no improvement"}.  Allergy Studies: {Blank single:19197::"none","deferred due to recent antihistamine use","deferred due to insurance stipulations that require a separate visit for testing","labs sent instead"," "}    {Blank  single:19197::"Allergy testing results were read and interpreted by myself, documented by clinical staff."," "}      Malachi Bonds, MD  [facility]

## 2024-01-20 ENCOUNTER — Other Ambulatory Visit (INDEPENDENT_AMBULATORY_CARE_PROVIDER_SITE_OTHER): Payer: Self-pay

## 2024-01-20 ENCOUNTER — Ambulatory Visit: Payer: 59 | Admitting: Orthopedic Surgery

## 2024-01-20 DIAGNOSIS — M545 Low back pain, unspecified: Secondary | ICD-10-CM

## 2024-01-20 DIAGNOSIS — M5416 Radiculopathy, lumbar region: Secondary | ICD-10-CM | POA: Diagnosis not present

## 2024-01-20 DIAGNOSIS — M542 Cervicalgia: Secondary | ICD-10-CM

## 2024-01-20 LAB — HEMOGLOBIN A1C
Est. average glucose Bld gHb Est-mCnc: 209 mg/dL
Hgb A1c MFr Bld: 8.9 % — ABNORMAL HIGH (ref 4.8–5.6)
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
## 2024-01-21 ENCOUNTER — Encounter: Payer: Self-pay | Admitting: Allergy & Immunology

## 2024-01-23 LAB — STREP PNEUMONIAE 23 SEROTYPES IGG
Pneumo Ab Type 1*: 1.3 ug/mL — ABNORMAL LOW (ref >1.3)
Pneumo Ab Type 12 (12F)*: <0.1 ug/mL — ABNORMAL LOW (ref >1.3)
Pneumo Ab Type 14*: 5.7 ug/mL (ref >1.3)
Pneumo Ab Type 17 (17F)*: 2.6 ug/mL (ref >1.3)
Pneumo Ab Type 19 (19F)*: 1.3 ug/mL — ABNORMAL LOW (ref >1.3)
Pneumo Ab Type 2*: 0.8 ug/mL — ABNORMAL LOW (ref >1.3)
Pneumo Ab Type 20*: 4.7 ug/mL (ref >1.3)
Pneumo Ab Type 22 (22F)*: 0.6 ug/mL — ABNORMAL LOW (ref >1.3)
Pneumo Ab Type 23 (23F)*: 6.3 ug/mL (ref >1.3)
Pneumo Ab Type 26 (6B)*: 0.2 ug/mL — ABNORMAL LOW (ref >1.3)
Pneumo Ab Type 3*: <0.1 ug/mL — ABNORMAL LOW (ref >1.3)
Pneumo Ab Type 34 (10A)*: 1.6 ug/mL (ref >1.3)
Pneumo Ab Type 4*: 1.8 ug/mL (ref >1.3)
Pneumo Ab Type 43 (11A)*: 2.1 ug/mL (ref >1.3)
Pneumo Ab Type 5*: 1.7 ug/mL (ref >1.3)
Pneumo Ab Type 51 (7F)*: 3.4 ug/mL (ref >1.3)
Pneumo Ab Type 54 (15B)*: 1.5 ug/mL (ref >1.3)
Pneumo Ab Type 56 (18C)*: 0.6 ug/mL — ABNORMAL LOW (ref >1.3)
Pneumo Ab Type 57 (19A)*: 1.3 ug/mL — ABNORMAL LOW (ref >1.3)
Pneumo Ab Type 68 (9V)*: 1.4 ug/mL (ref >1.3)
Pneumo Ab Type 70 (33F)*: 5.8 ug/mL (ref >1.3)
Pneumo Ab Type 8*: 16.8 ug/mL (ref >1.3)
Pneumo Ab Type 9 (9N)*: 1 ug/mL — ABNORMAL LOW (ref >1.3)

## 2024-01-23 LAB — IGG, IGA, IGM
IgA/Immunoglobulin A, Serum: 148 mg/dL (ref 87–352)
IgG (Immunoglobin G), Serum: 844 mg/dL (ref 586–1602)
IgM (Immunoglobulin M), Srm: 187 mg/dL (ref 26–217)

## 2024-01-23 LAB — CBC WITH DIFF/PLATELET
Basophils Absolute: 0.1 10*3/uL (ref 0.0–0.2)
Basos: 0 %
EOS (ABSOLUTE): 0.1 10*3/uL (ref 0.0–0.4)
Eos: 1 %
Hematocrit: 38.4 % (ref 34.0–46.6)
Hemoglobin: 11.7 g/dL (ref 11.1–15.9)
Immature Grans (Abs): 0.2 10*3/uL — ABNORMAL HIGH (ref 0.0–0.1)
Immature Granulocytes: 1 %
Lymphocytes Absolute: 2.8 10*3/uL (ref 0.7–3.1)
Lymphs: 18 %
MCH: 22.5 pg — ABNORMAL LOW (ref 26.6–33.0)
MCHC: 30.5 g/dL — ABNORMAL LOW (ref 31.5–35.7)
MCV: 74 fL — ABNORMAL LOW (ref 79–97)
Monocytes Absolute: 1.2 10*3/uL — ABNORMAL HIGH (ref 0.1–0.9)
Monocytes: 8 %
Neutrophils Absolute: 11.2 10*3/uL — ABNORMAL HIGH (ref 1.4–7.0)
Neutrophils: 72 %
Platelets: 507 10*3/uL — ABNORMAL HIGH (ref 150–450)
RBC: 5.2 x10E6/uL (ref 3.77–5.28)
RDW: 15.7 % — ABNORMAL HIGH (ref 11.7–15.4)
WBC: 15.5 10*3/uL — ABNORMAL HIGH (ref 3.4–10.8)

## 2024-01-23 LAB — DIPHTHERIA / TETANUS ANTIBODY PANEL
Diphtheria Ab: 0.68 [IU]/mL (ref <0.10)
Tetanus Ab, IgG: 3.61 [IU]/mL (ref <0.10)

## 2024-01-23 LAB — COMPLEMENT, TOTAL: Compl, Total (CH50): >60 U/mL (ref >41)

## 2024-01-26 ENCOUNTER — Telehealth: Payer: Self-pay

## 2024-01-26 NOTE — Telephone Encounter (Signed)
 Called patient to inform her about lab results. Unable to leave a VM.

## 2024-01-27 ENCOUNTER — Ambulatory Visit (INDEPENDENT_AMBULATORY_CARE_PROVIDER_SITE_OTHER): Admitting: *Deleted

## 2024-01-27 DIAGNOSIS — J309 Allergic rhinitis, unspecified: Secondary | ICD-10-CM

## 2024-01-27 NOTE — Addendum Note (Signed)
 Addended by: Dollene Cleveland R on: 01/27/2024 11:04 AM   Modules accepted: Orders

## 2024-01-27 NOTE — Telephone Encounter (Signed)
 Referral has been placed for Endocrinology per [facility]' instruction. Attempted to call patient to advise she can call to set up an appointment, there was no answer and no voicemail set up. Will attempt to call patent tomorrow.

## 2024-01-27 NOTE — Telephone Encounter (Signed)
 Went over patient's labs in person. Patient verbalized understanding.

## 2024-01-28 ENCOUNTER — Telehealth: Payer: Self-pay | Admitting: Allergy & Immunology

## 2024-01-28 NOTE — Telephone Encounter (Signed)
 Pt called and needs a referral to Diabetic wound and she didn't remember the name of the place and requested a call back at [phone]

## 2024-01-28 NOTE — Telephone Encounter (Signed)
 Called patient - DOB verified - advised of below notation.  Gave patient the following contact information for [facility]:  Address: [address]  [place], [place] [address] Office: [phone]  Patient declined assisitance to set up MyChart due to having a flip phone.  Patient verbalized understanding to all, no further questions.

## 2024-01-29 NOTE — Telephone Encounter (Signed)
 Patient called and stated she would like a call back regarding the diabetic doctor. She needs a referral but wants to speak to somebody here. Patients call back number is [phone]

## 2024-01-29 NOTE — Telephone Encounter (Signed)
 Patient called back and stated that she called the endo office yesterday and was not able to make the appointment. She was told she needed a referral. I informed her one was created, but that I would send a message regarding the issues.   Information provided 01/28/24 Address: [address]  [place], [place] [address] Office: [phone]

## 2024-01-30 ENCOUNTER — Other Ambulatory Visit: Payer: Self-pay | Admitting: Sports Medicine

## 2024-01-30 ENCOUNTER — Ambulatory Visit
Admission: RE | Admit: 2024-01-30 | Discharge: 2024-01-30 | Disposition: A | Discharge status: Home / Self Care | Source: Ambulatory Visit | Attending: Orthopedic Surgery | Admitting: Orthopedic Surgery

## 2024-01-30 DIAGNOSIS — G8929 Other chronic pain: Secondary | ICD-10-CM

## 2024-01-30 DIAGNOSIS — M5416 Radiculopathy, lumbar region: Secondary | ICD-10-CM

## 2024-02-01 NOTE — Telephone Encounter (Signed)
 Not sure why she was told the referral wasn't in the system. I called and got the patient scheduled for 03/24/2024 @ 10:00 A.M. with Dr. Altamese .  Patient has been informed of this information.

## 2024-02-08 ENCOUNTER — Ambulatory Visit (INDEPENDENT_AMBULATORY_CARE_PROVIDER_SITE_OTHER)

## 2024-02-08 DIAGNOSIS — J309 Allergic rhinitis, unspecified: Secondary | ICD-10-CM | POA: Diagnosis not present

## 2024-02-10 ENCOUNTER — Ambulatory Visit (INDEPENDENT_AMBULATORY_CARE_PROVIDER_SITE_OTHER): Admitting: Orthopedic Surgery

## 2024-02-10 ENCOUNTER — Other Ambulatory Visit: Payer: Self-pay

## 2024-02-10 ENCOUNTER — Other Ambulatory Visit (INDEPENDENT_AMBULATORY_CARE_PROVIDER_SITE_OTHER): Payer: Self-pay

## 2024-02-10 DIAGNOSIS — G8929 Other chronic pain: Secondary | ICD-10-CM

## 2024-02-10 DIAGNOSIS — M25511 Pain in right shoulder: Secondary | ICD-10-CM

## 2024-02-10 DIAGNOSIS — M19011 Primary osteoarthritis, right shoulder: Secondary | ICD-10-CM

## 2024-02-12 ENCOUNTER — Encounter: Payer: Self-pay | Admitting: Orthopedic Surgery
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
## 2024-02-13 ENCOUNTER — Encounter: Payer: Self-pay | Admitting: Orthopedic Surgery

## 2024-02-13 MED ORDER — METHYLPREDNISOLONE ACETATE 40 MG/ML IJ SUSP
13.3300 mg | INTRAMUSCULAR | Status: AC | PRN
  Administered 2024-02-10: 13.33 mg via INTRA_ARTICULAR

## 2024-02-13 MED ORDER — BUPIVACAINE HCL 0.25 % IJ SOLN
0.6600 mL | INTRAMUSCULAR | Status: AC | PRN
  Administered 2024-02-10: .66 mL via INTRA_ARTICULAR

## 2024-02-13 MED ORDER — LIDOCAINE HCL 1 % IJ SOLN
3.0000 mL | INTRAMUSCULAR | Status: AC | PRN
  Administered 2024-02-10: 3 mL

## 2024-02-17 ENCOUNTER — Ambulatory Visit (INDEPENDENT_AMBULATORY_CARE_PROVIDER_SITE_OTHER): Admitting: Orthopedic Surgery

## 2024-02-17 DIAGNOSIS — M5416 Radiculopathy, lumbar region: Secondary | ICD-10-CM

## 2024-02-17 MED ORDER — PREGABALIN 75 MG PO CAPS: 75.0000 mg | ORAL_CAPSULE | Freq: Two times a day (BID) | ORAL | 0 refills | Status: DC

## 2024-02-17 NOTE — Progress Notes (Signed)
 Orthopedic Spine Surgery Office Note   Assessment: Patient is a 63 y.o. female with two issues:    Cervical paraspinal pain Low back pain that radiates into bilateral anterior thighs - has central and bilateral foraminal stenosis that I suspect is causing radiculopathy     Plan: -Went over remaining non-operative treatment that she could try -Patient has tried tylenol, chiropractor, robaxin -Can continue with chiropractic treatment since she has found some relief with that -In regards to her back, I recommended a diagnostic/therapeutic injection.  She was interested in trying further medications so Lyrica was prescribed today -Patient should return to office in 6 weeks, x-rays at next visit: none     Patient expressed understanding of the plan and all questions were answered to the patient's satisfaction.    ___________________________________________________________________________     History:   Patient is a 63 y.o. female who presents today for cervical and lumbar spine.  Patient continues to have neck pain in the area of the paraspinal muscles.  She says the rotates around between the right and left side and sometimes she feels on both side in the paraspinal muscles.  She does not have any pain rating into either upper extremity.  She has been seeing a chiropractor which has been providing her with some relief with her neck symptoms.  She continues to have low back pain that radiates into her bilateral lower extremities.  She feels it is worse on the right side.  She feels a going into the anterior thighs.  She has not noticed relief with any treatment she is tried so far.  She said her back and leg pain is worse than her neck pain.  She has not developed any new symptoms since she was last seen.   Treatments tried: tylenol, chiropractor, robaxin     Physical Exam:   General: no acute distress, appears stated age Neurologic: alert, answering questions appropriately, following  commands Respiratory: unlabored breathing on room air, symmetric chest rise Psychiatric: appropriate affect, normal cadence to speech     MSK (spine):   -Strength exam                                                   Left                  Right Grip strength                5/5                  5/5 Interosseus                  5/5                  5/5 Wrist extension            5/5                  5/5 Wrist flexion                 5/5                  5/5 Elbow flexion                5/5  5/5 Deltoid                          5/5                  5/5   EHL                              5/5                  5/5 TA                                 5/5                  5/5 GSC                             5/5                  5/5 Knee extension            5/5                  5/5 Hip flexion                    5/5                  5/5   -Sensory exam                           Sensation intact to light touch in L2-S1 nerve distributions of bilateral lower extremities             Sensation intact to light touch in C4-T1 nerve distributions of bilateral upper extremities    Imaging: XRs of the cervical spine from 01/20/2024 were previously independently reviewed and interpreted, showing uninstrumented fusion from C5-7. Solid fusion mass seen across the former disc spaces. Disc height loss at C4/5. No fracture or dislocation seen. No evidence of instability on flexion/extension views.    XRs of the lumbar spine from 01/20/2024 were previously independently reviewed and interpreted, showing disc height loss at L1/2 and L2/3. Small anterior osteophytes seen at those levels. Increased kyphosis at the thoracolumbar junction. No fracture or dislocation. No evidence of instability on the flexion/extension views.    MRI of the lumbar spine from 01/30/2024 was independently reviewed and interpreted, showing bilateral foraminal stenosis and central stenosis at L3/4.  Right-sided foraminal  stenosis at L5/S1.  No other significant stenosis seen.    Patient name: Sheila Vincent Patient MRN: [id number] Date of visit: 02/17/24

## 2024-02-21 ENCOUNTER — Other Ambulatory Visit: Payer: Self-pay | Admitting: Allergy & Immunology

## 2024-02-23 ENCOUNTER — Ambulatory Visit (INDEPENDENT_AMBULATORY_CARE_PROVIDER_SITE_OTHER)

## 2024-02-23 DIAGNOSIS — J309 Allergic rhinitis, unspecified: Secondary | ICD-10-CM | POA: Diagnosis not present

## 2024-02-29 DIAGNOSIS — J3081 Allergic rhinitis due to animal (cat) (dog) hair and dander: Secondary | ICD-10-CM | POA: Diagnosis not present

## 2024-02-29 NOTE — Progress Notes (Signed)
 VIALS MADE 02-29-24. EXP 02-28-25

## 2024-03-01 DIAGNOSIS — J302 Other seasonal allergic rhinitis: Secondary | ICD-10-CM | POA: Diagnosis not present

## 2024-03-03 ENCOUNTER — Ambulatory Visit: Admitting: Physical Medicine and Rehabilitation

## 2024-03-03 ENCOUNTER — Ambulatory Visit

## 2024-03-03 VITALS — BP 154/84 | HR 81

## 2024-03-03 DIAGNOSIS — M5416 Radiculopathy, lumbar region: Secondary | ICD-10-CM | POA: Diagnosis not present

## 2024-03-03 MED ORDER — METHYLPREDNISOLONE ACETATE 40 MG/ML IJ SUSP
40.0000 mg | Freq: Once | INTRAMUSCULAR | Status: AC
  Administered 2024-03-03: 40 mg

## 2024-03-03 NOTE — Progress Notes (Signed)
 Pain Scale   Average Pain 6 Patient advising she has pain when doing daily chores. And does decrease when she rest.        +Driver, -BT, -Dye Allergies.

## 2024-03-03 NOTE — Progress Notes (Signed)
 Sheila Vincent - 63 y.o. female MRN [id number]  Date of birth: 02/01/1961  Office Visit Note: Visit Date: 03/03/2024 PCP: Davida Espy, MD Referred by: Diedra Fowler, MD  Subjective: Chief Complaint  Patient presents with   Lower Back - Pain   HPI:  Sheila Vincent is a 63 y.o. female who comes in today at the request of Dr. Colette Davies for planned Right L3-4 Lumbar Transforaminal epidural steroid injection with fluoroscopic guidance.  The patient has failed conservative care including home exercise, medications, time and activity modification.  This injection will be diagnostic and hopefully therapeutic.  Please see requesting physician notes for further details and justification.   ROS Otherwise per HPI.  Assessment & Plan: Visit Diagnoses:    ICD-10-CM   1. Lumbar radiculopathy  M54.16 XR C-ARM NO REPORT    Epidural Steroid injection    methylPREDNISolone  acetate (DEPO-MEDROL ) injection 40 mg      Plan: No additional findings.   Meds & Orders:  Meds ordered this encounter  Medications   methylPREDNISolone  acetate (DEPO-MEDROL ) injection 40 mg    Orders Placed This Encounter  Procedures   XR C-ARM NO REPORT   Epidural Steroid injection    Follow-up: Return for visit to requesting provider as needed.   Procedures: No procedures performed  Lumbosacral Transforaminal Epidural Steroid Injection - Sub-Pedicular Approach with Fluoroscopic Guidance  Patient: Sheila Vincent      Date of Birth: 1961-02-11 MRN: [id number] PCP: Davida Espy, MD      Visit Date: 03/03/2024   Universal Protocol:    Date/Time: 03/03/2024  Consent Given By: the patient  Position: PRONE  Additional Comments: Vital signs were monitored before and after the procedure. Patient was prepped and draped in the usual sterile fashion. The correct patient, procedure, and site was verified.   Injection Procedure Details:   Procedure diagnoses: Lumbar radiculopathy [M54.16]     Meds Administered:  Meds ordered this encounter  Medications   methylPREDNISolone  acetate (DEPO-MEDROL ) injection 40 mg    Laterality: Right  Location/Site: L3  Needle:5.0 in., 22 ga.  Short bevel or Quincke spinal needle  Needle Placement: Transforaminal  Findings:    -Comments: Excellent flow of contrast along the nerve, nerve root and into the epidural space.  Procedure Details: After squaring off the end-plates to get a true AP view, the C-arm was positioned so that an oblique view of the foramen as noted above was visualized. The target area is just inferior to the "nose of the scotty dog" or sub pedicular. The soft tissues overlying this structure were infiltrated with 2-3 ml. of 1% Lidocaine  without Epinephrine .  The spinal needle was inserted toward the target using a "trajectory" view along the fluoroscope beam.  Under AP and lateral visualization, the needle was advanced so it did not puncture dura and was located close the 6 O'Clock position of the pedical in AP tracterory. Biplanar projections were used to confirm position. Aspiration was confirmed to be negative for CSF and/or blood. A 1-2 ml. volume of Isovue-250 was injected and flow of contrast was noted at each level. Radiographs were obtained for documentation purposes.   After attaining the desired flow of contrast documented above, a 0.5 to 1.0 ml test dose of 0.25% Marcaine  was injected into each respective transforaminal space.  The patient was observed for 90 seconds post injection.  After no sensory deficits were reported, and normal lower extremity motor function was noted,   the above injectate was administered so  that equal amounts of the injectate were placed at each foramen (level) into the transforaminal epidural space.   Additional Comments:  The patient tolerated the procedure well Dressing: 2 x 2 sterile gauze and Band-Aid    Post-procedure details: Patient was observed during the  procedure. Post-procedure instructions were reviewed.  Patient left the clinic in stable condition.    Clinical History: No specialty comments available.     Objective:  VS:  HT:    WT:   BMI:     BP:(!) 154/84  HR:81bpm  TEMP: ( )  RESP:  Physical Exam Vitals and nursing note reviewed.  Constitutional:      General: She is not in acute distress.    Appearance: Normal appearance. She is not ill-appearing.  HENT:     Head: Normocephalic and atraumatic.     Right Ear: External ear normal.     Left Ear: External ear normal.  Eyes:     Extraocular Movements: Extraocular movements intact.  Cardiovascular:     Rate and Rhythm: Normal rate.     Pulses: Normal pulses.  Pulmonary:     Effort: Pulmonary effort is normal. No respiratory distress.  Abdominal:     General: There is no distension.     Palpations: Abdomen is soft.  Musculoskeletal:        General: Tenderness present.     Cervical back: Neck supple.     Right lower leg: No edema.     Left lower leg: No edema.     Comments: Patient has good distal strength with no pain over the greater trochanters.  No clonus or focal weakness.  Skin:    Findings: No erythema, lesion or rash.  Neurological:     General: No focal deficit present.     Mental Status: She is alert and oriented to person, place, and time.     Sensory: No sensory deficit.     Motor: No weakness or abnormal muscle tone.     Coordination: Coordination normal.  Psychiatric:        Mood and Affect: Mood normal.        Behavior: Behavior normal.      Imaging: No results found.

## 2024-03-03 NOTE — Patient Instructions (Signed)

## 2024-03-04 ENCOUNTER — Other Ambulatory Visit: Payer: Self-pay | Admitting: Cardiology

## 2024-03-04 DIAGNOSIS — E781 Pure hyperglyceridemia: Secondary | ICD-10-CM

## 2024-03-16 ENCOUNTER — Ambulatory Visit (INDEPENDENT_AMBULATORY_CARE_PROVIDER_SITE_OTHER)

## 2024-03-16 DIAGNOSIS — J309 Allergic rhinitis, unspecified: Secondary | ICD-10-CM

## 2024-03-21 NOTE — Procedures (Signed)
 Lumbosacral Transforaminal Epidural Steroid Injection - Sub-Pedicular Approach with Fluoroscopic Guidance  Patient: Sheila Vincent      Date of Birth: 1961/08/03 MRN: [id number] PCP: Davida Espy, MD      Visit Date: 03/03/2024   Universal Protocol:    Date/Time: 03/03/2024  Consent Given By: the patient  Position: PRONE  Additional Comments: Vital signs were monitored before and after the procedure. Patient was prepped and draped in the usual sterile fashion. The correct patient, procedure, and site was verified.   Injection Procedure Details:   Procedure diagnoses: Lumbar radiculopathy [M54.16]    Meds Administered:  Meds ordered this encounter  Medications   methylPREDNISolone  acetate (DEPO-MEDROL ) injection 40 mg    Laterality: Right  Location/Site: L3  Needle:5.0 in., 22 ga.  Short bevel or Quincke spinal needle  Needle Placement: Transforaminal  Findings:    -Comments: Excellent flow of contrast along the nerve, nerve root and into the epidural space.  Procedure Details: After squaring off the end-plates to get a true AP view, the C-arm was positioned so that an oblique view of the foramen as noted above was visualized. The target area is just inferior to the "nose of the scotty dog" or sub pedicular. The soft tissues overlying this structure were infiltrated with 2-3 ml. of 1% Lidocaine  without Epinephrine .  The spinal needle was inserted toward the target using a "trajectory" view along the fluoroscope beam.  Under AP and lateral visualization, the needle was advanced so it did not puncture dura and was located close the 6 O'Clock position of the pedical in AP tracterory. Biplanar projections were used to confirm position. Aspiration was confirmed to be negative for CSF and/or blood. A 1-2 ml. volume of Isovue-250 was injected and flow of contrast was noted at each level. Radiographs were obtained for documentation purposes.   After attaining the desired  flow of contrast documented above, a 0.5 to 1.0 ml test dose of 0.25% Marcaine  was injected into each respective transforaminal space.  The patient was observed for 90 seconds post injection.  After no sensory deficits were reported, and normal lower extremity motor function was noted,   the above injectate was administered so that equal amounts of the injectate were placed at each foramen (level) into the transforaminal epidural space.   Additional Comments:  The patient tolerated the procedure well Dressing: 2 x 2 sterile gauze and Band-Aid    Post-procedure details: Patient was observed during the procedure. Post-procedure instructions were reviewed.  Patient left the clinic in stable condition.

## 2024-03-24 ENCOUNTER — Ambulatory Visit: Admitting: "Endocrinology

## 2024-03-26 ENCOUNTER — Encounter (HOSPITAL_COMMUNITY): Payer: Self-pay

## 2024-03-26 ENCOUNTER — Ambulatory Visit (HOSPITAL_COMMUNITY)
Admission: EM | Admit: 2024-03-26 | Discharge: 2024-03-26 | Disposition: A | Discharge status: Home / Self Care | Attending: Emergency Medicine | Admitting: Emergency Medicine

## 2024-03-26 DIAGNOSIS — R0789 Other chest pain: Secondary | ICD-10-CM

## 2024-03-26 DIAGNOSIS — M542 Cervicalgia: Secondary | ICD-10-CM | POA: Diagnosis not present

## 2024-03-26 DIAGNOSIS — M62838 Other muscle spasm: Secondary | ICD-10-CM | POA: Diagnosis not present

## 2024-03-26 DIAGNOSIS — G8929 Other chronic pain: Secondary | ICD-10-CM | POA: Diagnosis not present

## 2024-03-26 MED ORDER — BACLOFEN 10 MG PO TABS: 10.0000 mg | ORAL_TABLET | Freq: Three times a day (TID) | ORAL | 0 refills | Status: DC

## 2024-03-26 MED ORDER — LIDOCAINE 4 % EX PTCH: 1.0000 | MEDICATED_PATCH | CUTANEOUS | 0 refills | Status: DC

## 2024-03-26 NOTE — Discharge Instructions (Addendum)
 Your EKG is normal and does not show any acute cardiac findings.   I believe your symptoms are likely muscular in nature. Take baclofen 3 times daily as needed for muscle pain and spasms.  This can make you drowsy so do not drive, work, or drink alcohol while taking this. You can also apply lidocaine  patch for 12 hours at a time for additional pain relief. Alternate between ice and heat as needed for pain and do some gentle stretching to prevent any additional stiffness. Follow-up with your orthopedic doctor or primary care provider if your pain persists. If you develop worsening chest pain, shortness of breath, severe dizziness, passing out, severe weakness, numbness, or difficulty walking please seek immediate medical treatment in the emergency department.
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
## 2024-03-29 ENCOUNTER — Other Ambulatory Visit: Payer: Self-pay | Admitting: Nurse Practitioner

## 2024-03-30 ENCOUNTER — Telehealth: Payer: Self-pay | Admitting: Orthopedic Surgery

## 2024-03-30 ENCOUNTER — Ambulatory Visit (INDEPENDENT_AMBULATORY_CARE_PROVIDER_SITE_OTHER): Admitting: Orthopedic Surgery

## 2024-03-30 DIAGNOSIS — M5416 Radiculopathy, lumbar region: Secondary | ICD-10-CM

## 2024-03-30 NOTE — Telephone Encounter (Signed)
 Patient says she would like to go to pain management at [facility]. Could Dr. Sulema Endo send a referral?

## 2024-03-30 NOTE — Progress Notes (Signed)
 Orthopedic Spine Surgery Office Note   Assessment: Patient is a 63 y.o. female with low back pain that radiates into bilateral anterior thighs - has central and bilateral foraminal stenosis at L3/4 causing radiculopathy     Plan: -Patient has tried tylenol , chiropractor, robaxin , lyrica , lumbar steroid injection -Talked about her options at this point. Discussed L3/4 XLIF and PSIF for indirect decompression. Told her this would more predictably help with leg pain but would not predictably help with back pain. I told her her other option would be pain management. She wanted to think about it upon leaving the office but then called back shortly after and said she wanted a referral to pain management which was provided to her -Patient should return to office in 3 weeks, x-rays at next visit: none     Patient expressed understanding of the plan and all questions were answered to the patient's satisfaction.    ___________________________________________________________________________     History:   Patient is a 63 y.o. female who presents today for follow up on her cervical and lumbar spine. She continues to have low back pain that radiates into bilateral anterior thighs. It varies on which thigh is more painful. Her pain in her back is generally worse than her legs. After our visit, she saw her chiropractor who tole her that there is not much more he can do for her back. She also got an injection with Dr. Daisey Dryer which she said gave her a couple of days of good relief but then pain slowly returned. Her pain is now back to how it was at our last office visit. She has not developed any new symptoms since she was last seen.   Treatments tried:  tylenol , chiropractor, robaxin , lyrica , lumbar steroid injection     Physical Exam:   General: no acute distress, appears stated age Neurologic: alert, answering questions appropriately, following commands Respiratory: unlabored breathing on room air,  symmetric chest rise Psychiatric: appropriate affect, normal cadence to speech     MSK (spine):   -Strength exam                                                   Left                  Right   EHL                              5/5                  5/5 TA                                 5/5                  5/5 GSC                             5/5                  5/5 Knee extension            5/5  5/5 Hip flexion                    5/5                  5/5   -Sensory exam                           Sensation intact to light touch in L2-S1 nerve distributions of bilateral lower extremities     Imaging: XRs of the lumbar spine from 01/20/2024 were previously independently reviewed and interpreted, showing disc height loss at L1/2 and L2/3. Small anterior osteophytes seen at those levels. Increased kyphosis at the thoracolumbar junction. No fracture or dislocation. No evidence of instability on the flexion/extension views.    MRI of the lumbar spine from 01/30/2024 was previously independently reviewed and interpreted, showing bilateral foraminal stenosis and central stenosis at L3/4.  Right-sided foraminal stenosis at L5/S1.  No other significant stenosis seen.     Patient name: Sheila Vincent Patient MRN: [id number] Date of visit: 03/30/24

## 2024-04-01 ENCOUNTER — Other Ambulatory Visit: Payer: Self-pay | Admitting: Cardiology

## 2024-04-01 DIAGNOSIS — E781 Pure hyperglyceridemia: Secondary | ICD-10-CM

## 2024-04-08 ENCOUNTER — Ambulatory Visit (INDEPENDENT_AMBULATORY_CARE_PROVIDER_SITE_OTHER): Payer: Self-pay | Admitting: *Deleted

## 2024-04-08 DIAGNOSIS — J309 Allergic rhinitis, unspecified: Secondary | ICD-10-CM

## 2024-04-09 ENCOUNTER — Ambulatory Visit (HOSPITAL_COMMUNITY)
Admission: EM | Admit: 2024-04-09 | Discharge: 2024-04-09 | Disposition: A | Discharge status: Home / Self Care | Attending: Emergency Medicine | Admitting: Emergency Medicine

## 2024-04-09 ENCOUNTER — Ambulatory Visit (INDEPENDENT_AMBULATORY_CARE_PROVIDER_SITE_OTHER)

## 2024-04-09 ENCOUNTER — Encounter (HOSPITAL_COMMUNITY): Payer: Self-pay

## 2024-04-09 DIAGNOSIS — M109 Gout, unspecified: Secondary | ICD-10-CM

## 2024-04-09 DIAGNOSIS — M79672 Pain in left foot: Secondary | ICD-10-CM

## 2024-04-09 MED ORDER — PREDNISONE 20 MG PO TABS: 20.0000 mg | ORAL_TABLET | Freq: Every day | ORAL | 0 refills | Status: AC

## 2024-04-09 MED ORDER — COLCHICINE 0.6 MG PO TABS
ORAL_TABLET | ORAL | 0 refills | Status: DC
Start: 2024-04-09 — End: 2024-08-15

## 2024-04-09 NOTE — Discharge Instructions (Addendum)
 I believe your symptoms are likely related to new onset of gout.  I have attached some information about gout in your paperwork.   Take 2 tablets of colchicine when you pick up the prescription and then the third tablet in an hour if symptoms persist. Take 1 tablet of prednisone  once daily for 3 days. Otherwise you can apply ice to the area to help with pain and swelling. Rest and elevate your foot as often as possible. Follow-up with your primary care provider or return here as needed.

## 2024-04-09 NOTE — ED Triage Notes (Signed)
 Patient here today with c/o left foot pain and swelling X 1.5 weeks.
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
## 2024-04-20 ENCOUNTER — Other Ambulatory Visit: Payer: Self-pay

## 2024-04-20 ENCOUNTER — Ambulatory Visit (HOSPITAL_COMMUNITY): Admission: EM | Admit: 2024-04-20 | Discharge: 2024-04-20 | Disposition: A | Discharge status: Home / Self Care

## 2024-04-20 ENCOUNTER — Encounter (HOSPITAL_COMMUNITY): Payer: Self-pay | Admitting: *Deleted

## 2024-04-20 DIAGNOSIS — R600 Localized edema: Secondary | ICD-10-CM | POA: Diagnosis not present

## 2024-04-20 NOTE — Discharge Instructions (Addendum)
 Watch your salt intake Wear support hose, Take home meds as prescribed Follow up with PCP-call for appt Go to Er if you develop chest pain shortness of breath or worsening symptoms

## 2024-04-20 NOTE — ED Triage Notes (Signed)
 PT has swelling to Lt leg and foot fo r3 weeks.

## 2024-04-20 NOTE — ED Notes (Signed)
 Per provider ,Pt refused CBG check.
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
## 2024-04-25 ENCOUNTER — Telehealth: Payer: Self-pay | Admitting: Cardiology

## 2024-04-25 DIAGNOSIS — E781 Pure hyperglyceridemia: Secondary | ICD-10-CM

## 2024-04-25 NOTE — Telephone Encounter (Signed)
 Pt of Dr. Michele. Please advise on a refill.

## 2024-04-25 NOTE — Telephone Encounter (Signed)
*  STAT* If patient is at the pharmacy, call can be transferred to refill team.   1. Which medications need to be refilled? (please list name of each medication and dose if known) fenofibrate  (TRICOR ) 145 MG tablet    2. Would you like to learn more about the convenience, safety, & potential cost savings by using the [facility]?     3. Are you open to using the [facility] (Type [facility].  ).   4. Which pharmacy/location (including street and city if local pharmacy) is medication to be sent to? [id number] - , [place] - [address] AT [facility] OF [address]    5. Do they need a 30 day or 90 day supply? 30 day

## 2024-04-26 ENCOUNTER — Ambulatory Visit (INDEPENDENT_AMBULATORY_CARE_PROVIDER_SITE_OTHER)

## 2024-04-26 DIAGNOSIS — J309 Allergic rhinitis, unspecified: Secondary | ICD-10-CM

## 2024-04-26 MED ORDER — FENOFIBRATE 145 MG PO TABS
145.0000 mg | ORAL_TABLET | Freq: Every day | ORAL | 0 refills | Status: DC
Start: 2024-04-26 — End: 2024-05-26

## 2024-04-26 NOTE — Telephone Encounter (Signed)
 RX sent to requested Pharmacy

## 2024-04-26 NOTE — Telephone Encounter (Signed)
 May refill fenofibrate  for 30 days. If she is stable from a cardiovascular standpoint she can continue to follow with PCP and they can take over the refills.   However, more than happy to see her in follow-up if needed as she is scheduled for May 26, 2024.  Kosta Schnitzler Balm, DO, FACC

## 2024-04-27 ENCOUNTER — Ambulatory Visit: Admitting: Orthopedic Surgery

## 2024-05-09 ENCOUNTER — Ambulatory Visit (INDEPENDENT_AMBULATORY_CARE_PROVIDER_SITE_OTHER)

## 2024-05-09 DIAGNOSIS — J309 Allergic rhinitis, unspecified: Secondary | ICD-10-CM

## 2024-05-12 ENCOUNTER — Ambulatory Visit: Admitting: "Endocrinology

## 2024-05-18 ENCOUNTER — Ambulatory Visit (INDEPENDENT_AMBULATORY_CARE_PROVIDER_SITE_OTHER)

## 2024-05-18 DIAGNOSIS — J309 Allergic rhinitis, unspecified: Secondary | ICD-10-CM

## 2024-05-26 ENCOUNTER — Encounter: Payer: Self-pay | Admitting: Cardiology

## 2024-05-26 ENCOUNTER — Ambulatory Visit: Attending: Cardiology | Admitting: Cardiology

## 2024-05-26 VITALS — BP 124/82 | HR 77 | Resp 16 | Ht 64.0 in | Wt 152.2 lb

## 2024-05-26 DIAGNOSIS — E781 Pure hyperglyceridemia: Secondary | ICD-10-CM | POA: Diagnosis not present

## 2024-05-26 DIAGNOSIS — E7800 Pure hypercholesterolemia, unspecified: Secondary | ICD-10-CM | POA: Diagnosis not present

## 2024-05-26 DIAGNOSIS — I10 Essential (primary) hypertension: Secondary | ICD-10-CM

## 2024-05-26 DIAGNOSIS — E119 Type 2 diabetes mellitus without complications: Secondary | ICD-10-CM

## 2024-05-26 MED ORDER — FENOFIBRATE 145 MG PO TABS: 145.0000 mg | ORAL_TABLET | Freq: Every day | ORAL | 3 refills | Status: DC

## 2024-05-26 NOTE — Progress Notes (Signed)
 Cardiology Office Note:  .   ID:  Sheila Vincent, DOB May 07, 1961, MRN [id number] PCP:  Sim Emery CROME, MD  Former Cardiology Providers: None [facility] Providers Cardiologist:  Madonna Large, DO , [facility] (established care 12/30/2021) Electrophysiologist:  None  Click to update primary MD,subspecialty MD or APP then REFRESH:1}    Chief Complaint  Patient presents with   Hyperlipidemia   Follow-up    History of Present Illness: Sheila Vincent is a 63 y.o. Caucasian female whose past medical history and cardiovascular risk factors includes: HTN, NIDDM Type II, hyperlipidemia, hypertriglyceridemia Hx of COVID infection, Crohn's Disease.   Patient being followed by the practice for management of hyperlipidemia and hypertriglyceridemia.  She was last seen in the office back in February 2024 at that time patient was on statin therapy, Zetia , and fenofibrate .  At last office visit patient underwent hip surgery with Dr. Vernetta in February 2020 for.  Postoperatively she is doing well.  She is under pain management at [facility].  Review of Systems: .   Review of Systems  Cardiovascular:  Negative for chest pain, claudication, irregular heartbeat, leg swelling, near-syncope, orthopnea, palpitations, paroxysmal nocturnal dyspnea and syncope.  Respiratory:  Negative for shortness of breath.   Hematologic/Lymphatic: Negative for bleeding problem.    Studies Reviewed:   EKG: EKG Interpretation Date/Time:  Thursday May 26 2024 14:57:41 EDT Ventricular Rate:  76 PR Interval:  164 QRS Duration:  82 QT Interval:  392 QTC Calculation: 441 R Axis:   33  Text Interpretation: Normal sinus rhythm Normal ECG When compared with ECG of 26-Mar-2024 18:17, No significant change was found Confirmed by Large Madonna [phone]) on 05/26/2024 3:26:57 PM  Echocardiogram: 01/10/2022: Normal LV systolic function with visual EF 60-65%. Left ventricle cavity is normal in size. Normal left  ventricular wall thickness. Normal global wall motion. Normal diastolic filling pattern, normal LAP.  No significant valvular heart disease. No prior study for comparison.    Stress Testing: Lexiscan Nuclear stress test 01/20/2022: Low risk.   Carotid artery duplex 01/10/2022: Duplex suggests stenosis in the right internal carotid artery (minimal). Duplex suggests stenosis in the left internal carotid artery (minimal). There is diffuse mild homogeneous plaque noted in bilateral carotid arteries. THere is mild increase in velocity across the right external carotid artery and may be a source of bruit.  Antegrade right vertebral artery flow. Antegrade left vertebral artery flow.   RADIOLOGY: NA  Risk Assessment/Calculations:   NA   Labs:       Latest Ref Rng & Units 01/19/2024   12:20 PM 12/11/2023    3:45 PM 09/17/2023   11:44 AM  CBC  WBC 3.4 - 10.8 x10E3/uL 15.5  9.5  14.2   Hemoglobin 11.1 - 15.9 g/dL 88.2  9.8  88.8   Hematocrit 34.0 - 46.6 % 38.4  31.8  36.3   Platelets 150 - 450 x10E3/uL 507  530  476.0        Latest Ref Rng & Units 05/27/2024    1:16 PM 12/11/2023    3:45 PM 09/17/2023   11:44 AM  BMP  Glucose 70 - 99 mg/dL 878  827  834   BUN 8 - 27 mg/dL 12  7  12    Creatinine 0.57 - 1.00 mg/dL 9.05  9.28  9.24   BUN/Creat Ratio 12 - 28 13     Sodium 134 - 144 mmol/L 141  139  137   Potassium 3.5 - 5.2 mmol/L  4.8  3.8  3.9   Chloride 96 - 106 mmol/L 100  106  103   CO2 20 - 29 mmol/L 23  22  26    Calcium  8.7 - 10.3 mg/dL 9.8  8.9  9.9       Latest Ref Rng & Units 05/27/2024    1:16 PM 12/11/2023    3:45 PM 09/17/2023   11:44 AM  CMP  Glucose 70 - 99 mg/dL 878  827  834   BUN 8 - 27 mg/dL 12  7  12    Creatinine 0.57 - 1.00 mg/dL 9.05  9.28  9.24   Sodium 134 - 144 mmol/L 141  139  137   Potassium 3.5 - 5.2 mmol/L 4.8  3.8  3.9   Chloride 96 - 106 mmol/L 100  106  103   CO2 20 - 29 mmol/L 23  22  26    Calcium  8.7 - 10.3 mg/dL 9.8  8.9  9.9   Total Protein  6.0 - 8.5 g/dL 7.4  6.7  7.7   Total Bilirubin 0.0 - 1.2 mg/dL 0.3  0.4  0.3   Alkaline Phos 44 - 121 IU/L 118  94  116   AST 0 - 40 IU/L 21  24  18    ALT 0 - 32 IU/L 22  25  29      Lab Results  Component Value Date   CHOL 157 05/27/2024   HDL 39 (L) 05/27/2024   LDLCALC 75 05/27/2024   LDLDIRECT 78 11/14/2022   TRIG 262 (H) 05/27/2024   CHOLHDL 4.0 05/27/2024   No results for input(s): LIPOA in the last 8760 hours. No components found for: NTPROBNP No results for input(s): PROBNP in the last 8760 hours. No results for input(s): TSH in the last 8760 hours.  Physical Exam:    Today's Vitals   05/26/24 1453  BP: 124/82  Pulse: 77  Resp: 16  SpO2: 95%  Weight: 152 lb 3.2 oz (69 kg)  Height: 5' 4 (1.626 m)   Body mass index is 26.13 kg/m. Wt Readings from Last 3 Encounters:  05/26/24 152 lb 3.2 oz (69 kg)  11/10/23 157 lb 11.2 oz (71.5 kg)  09/17/23 160 lb 4 oz (72.7 kg)    Physical Exam  Constitutional: No distress.  Age appropriate, hemodynamically stable.   Neck: No JVD present.  Cardiovascular: Normal rate, regular rhythm, S1 normal, S2 normal and intact distal pulses. Exam reveals no gallop, no S3 and no S4.  Pulses:      Dorsalis pedis pulses are 2+ on the right side and 1+ on the left side.       Posterior tibial pulses are 2+ on the right side and 1+ on the left side.  Pulmonary/Chest: Effort normal and breath sounds normal. No stridor. She has no wheezes. She has no rales.  Abdominal: Soft. Bowel sounds are normal. She exhibits no distension. There is no abdominal tenderness.  Musculoskeletal:        General: No edema.     Cervical back: Neck supple.  Neurological: She is alert and oriented to person, place, and time. She has intact cranial nerves (2-12).  Skin: Skin is warm and moist.     Impression & Recommendation(s):  Impression:   ICD-10-CM   1. Hypertriglyceridemia  E78.1 fenofibrate  (TRICOR ) 145 MG tablet    Comprehensive metabolic  panel with GFR    Lipid panel    Lipid panel    Comprehensive metabolic panel with GFR  2. Pure hypercholesterolemia  E78.00 Comprehensive metabolic panel with GFR    Lipid panel    Lipid panel    Comprehensive metabolic panel with GFR    3. Benign hypertension  I10 EKG 12-Lead    4. Non-insulin dependent type 2 diabetes mellitus (HCC)  E11.9        Recommendation(s):  Hypertriglyceridemia Pure hypercholesterolemia Currently on atorvastatin  40 mg p.o. daily.  Fenofibrate  145 mg p.o. daily. Patient requesting refills of fenofibrate  She denies myalgia or other side effects. Check fasting lipids and CMP. Further recommendations to follow  Benign hypertension Open blood pressures are very well-controlled. Continue Benicar 20 mg p.o. daily. Continue amlodipine  5 mg p.o. daily  Non-insulin dependent type 2 diabetes mellitus (HCC) We emphasized importance of glycemic control. Currently on ARB, statin therapy Recommended goal LDL at least <70 mg/dL and triglycerides <850 mg/dL  Orders Placed:  Orders Placed This Encounter  Procedures   Comprehensive metabolic panel with GFR    Standing Status:   Future    Number of Occurrences:   1    Expected Date:   05/26/2024    Expiration Date:   05/26/2025   Lipid panel    Standing Status:   Future    Number of Occurrences:   1    Expected Date:   05/26/2024    Expiration Date:   05/26/2025   EKG 12-Lead     Final Medication List:    Meds ordered this encounter  Medications   fenofibrate  (TRICOR ) 145 MG tablet    Sig: Take 1 tablet (145 mg total) by mouth daily.    Dispense:  90 tablet    Refill:  3    Pt must keep upcoming followup appt with Cardiology in July 2025 for any more refills. Thank You    Medications Discontinued During This Encounter  Medication Reason   fenofibrate  (TRICOR ) 145 MG tablet Reorder     Current Outpatient Medications:    Accu-Chek Softclix Lancets lancets, every morning., Disp: , Rfl:     albuterol  (VENTOLIN  HFA) 108 (90 Base) MCG/ACT inhaler, INHALE 2 PUFFS INTO THE LUNGS EVERY 6 HOURS AS NEEDED FOR WHEEZING OR SHORTNESS OF BREATH, Disp: 8.5 g, Rfl: 1   atorvastatin  (LIPITOR) 40 MG tablet, Take 40 mg by mouth at bedtime., Disp: , Rfl:    azelastine  (ASTELIN ) 0.1 % nasal spray, PLACE 2 SPRAY IN EACH NOSTRIL TWICE DAILY AS DIRECTED, Disp: 90 mL, Rfl: 1   Blood Glucose Monitoring Suppl (ACCU-CHEK GUIDE) w/Device KIT, USE TO CHECK BLOOD SUGAR DAILY, Disp: , Rfl:    cetirizine  (ZYRTEC ) 10 MG tablet, Take 1 tablet (10 mg total) by mouth in the morning and at bedtime., Disp: 180 tablet, Rfl: 1   colchicine  0.6 MG tablet, Take 2 tablets of colchicine  first and then take 1 tablet in an hour if symptoms persist, Disp: 3 tablet, Rfl: 0   doxycycline  (VIBRAMYCIN ) 100 MG capsule, Take 1 capsule (100 mg total) by mouth 2 (two) times daily., Disp: 20 capsule, Rfl: 0   FERRETTS 325 (106 Fe) MG TABS tablet, Take 1 tablet by mouth daily., Disp: , Rfl:    fexofenadine (ALLEGRA) 180 MG tablet, Take 180 mg by mouth daily as needed for allergies., Disp: , Rfl:    fluticasone  (FLONASE ) 50 MCG/ACT nasal spray, Place 2 sprays into both nostrils daily., Disp: 48 g, Rfl: 1   glucose blood test strip, TEST BLOOD SUGAR EVERY MORNING BEFORE BREAKFAST, Disp: , Rfl:    ipratropium (ATROVENT)  0.06 % nasal spray, Place 2 sprays into both nostrils 4 (four) times daily., Disp: , Rfl:    levocetirizine (XYZAL ) 5 MG tablet, Take 1 tablet (5 mg total) by mouth every evening., Disp: 90 tablet, Rfl: 1   lidocaine  4 %, Place 1 patch onto the skin daily., Disp: 20 patch, Rfl: 0   mesalamine  (LIALDA ) 1.2 g EC tablet, Take 4 tablets (4.8 g total) by mouth daily with breakfast. Please schedule a follow up visit for additional refills. Thank you, Disp: 360 tablet, Rfl: 0   metFORMIN  (GLUCOPHAGE ) 500 MG tablet, Take 500 mg by mouth 2 (two) times daily., Disp: , Rfl:    montelukast  (SINGULAIR ) 10 MG tablet, Take 1 tablet (10 mg  total) by mouth at bedtime., Disp: 90 tablet, Rfl: 1   olmesartan (BENICAR) 20 MG tablet, Take 20 mg by mouth daily., Disp: , Rfl:    omeprazole  (PRILOSEC) 40 MG capsule, TAKE 1 CAPSULE(40 MG) BY MOUTH DAILY, Disp: 90 capsule, Rfl: 1   pregabalin  (LYRICA ) 75 MG capsule, Take 1 capsule (75 mg total) by mouth 2 (two) times daily., Disp: 60 capsule, Rfl: 0   triamcinolone  ointment (KENALOG ) 0.5 %, Apply 2-3 times daily to your fingers to see if this helps with wound healing., Disp: 30 g, Rfl: 0   amLODipine  (NORVASC ) 5 MG tablet, Take 5 mg by mouth daily. (Patient not taking: Reported on 05/26/2024), Disp: , Rfl:    baclofen  (LIORESAL ) 10 MG tablet, Take 1 tablet (10 mg total) by mouth 3 (three) times daily. (Patient not taking: Reported on 05/26/2024), Disp: 30 each, Rfl: 0   fenofibrate  (TRICOR ) 145 MG tablet, Take 1 tablet (145 mg total) by mouth daily., Disp: 90 tablet, Rfl: 3  Consent:   NA  Disposition:   1 year follow-up sooner if needed  Her questions and concerns were addressed to her satisfaction. She voices understanding of the recommendations provided during this encounter.    Signed, Madonna Michele HAS, [facility]  A Division of [facility] [address] [place], [place] [address]  [place], [place] [address]

## 2024-05-26 NOTE — Patient Instructions (Signed)
 Medication Instructions:  Refill for Fenofibrate  has been sent to your pharmacy.  *If you need a refill on your cardiac medications before your next appointment, please call your pharmacy*  Lab Work: To be completed today: FASTING lipids and CMP  To be completed in 1 yr: FASTING lipids and CMP  If you have labs (blood work) drawn today and your tests are completely normal, you will receive your results only by: MyChart Message (if you have MyChart) OR A paper copy in the mail If you have any lab test that is abnormal or we need to change your treatment, we will call you to review the results.  Testing/Procedures: None ordered today.  Follow-Up: At [facility], you and your health needs are our priority.  As part of our continuing mission to provide you with exceptional heart care, our providers are all part of one team.  This team includes your primary Cardiologist (physician) and Advanced Practice Providers or APPs (Physician Assistants and Nurse Practitioners) who all work together to provide you with the care you need, when you need it.  Your next appointment:   1 year(s)  Provider:   Madonna Large, DO

## 2024-05-28 LAB — COMPREHENSIVE METABOLIC PANEL WITH GFR
ALT: 22 IU/L (ref 0–32)
AST: 21 IU/L (ref 0–40)
Albumin: 4.8 g/dL (ref 3.9–4.9)
Alkaline Phosphatase: 118 IU/L (ref 44–121)
BUN/Creatinine Ratio: 13 (ref 12–28)
BUN: 12 mg/dL (ref 8–27)
Bilirubin Total: 0.3 mg/dL (ref 0.0–1.2)
CO2: 23 mmol/L (ref 20–29)
Calcium: 9.8 mg/dL (ref 8.7–10.3)
Chloride: 100 mmol/L (ref 96–106)
Creatinine, Ser: 0.94 mg/dL (ref 0.57–1.00)
Globulin, Total: 2.6 g/dL (ref 1.5–4.5)
Glucose: 121 mg/dL — ABNORMAL HIGH (ref 70–99)
Potassium: 4.8 mmol/L (ref 3.5–5.2)
Sodium: 141 mmol/L (ref 134–144)
Total Protein: 7.4 g/dL (ref 6.0–8.5)
eGFR: 68 mL/min/1.73 (ref >59)

## 2024-05-28 LAB — LIPID PANEL
Chol/HDL Ratio: 4 ratio (ref 0.0–4.4)
Cholesterol, Total: 157 mg/dL (ref 100–199)
HDL: 39 mg/dL — ABNORMAL LOW (ref >39)
LDL Chol Calc (NIH): 75 mg/dL (ref 0–99)
Triglycerides: 262 mg/dL — ABNORMAL HIGH (ref 0–149)
VLDL Cholesterol Cal: 43 mg/dL — ABNORMAL HIGH (ref 5–40)

## 2024-05-30 ENCOUNTER — Telehealth: Payer: Self-pay | Admitting: Hematology and Oncology

## 2024-05-30 NOTE — Telephone Encounter (Signed)
 Sheila Vincent called in to re-schedule her appointment.

## 2024-05-31 ENCOUNTER — Encounter: Payer: Self-pay | Admitting: Cardiology

## 2024-06-01 ENCOUNTER — Ambulatory Visit: Payer: Self-pay | Admitting: Cardiology

## 2024-06-01 DIAGNOSIS — E7800 Pure hypercholesterolemia, unspecified: Secondary | ICD-10-CM

## 2024-06-03 ENCOUNTER — Other Ambulatory Visit: Payer: Self-pay

## 2024-06-03 DIAGNOSIS — E7800 Pure hypercholesterolemia, unspecified: Secondary | ICD-10-CM

## 2024-06-03 DIAGNOSIS — E781 Pure hyperglyceridemia: Secondary | ICD-10-CM

## 2024-06-06 ENCOUNTER — Ambulatory Visit: Admitting: Hematology and Oncology

## 2024-06-06 ENCOUNTER — Other Ambulatory Visit

## 2024-06-10 ENCOUNTER — Ambulatory Visit

## 2024-06-10 DIAGNOSIS — J309 Allergic rhinitis, unspecified: Secondary | ICD-10-CM | POA: Diagnosis not present

## 2024-06-14 ENCOUNTER — Other Ambulatory Visit (HOSPITAL_BASED_OUTPATIENT_CLINIC_OR_DEPARTMENT_OTHER): Payer: Self-pay

## 2024-06-14 ENCOUNTER — Telehealth: Payer: Self-pay | Admitting: Cardiology

## 2024-06-14 DIAGNOSIS — E7800 Pure hypercholesterolemia, unspecified: Secondary | ICD-10-CM

## 2024-06-14 MED ORDER — ICOSAPENT ETHYL 1 G PO CAPS: 2.0000 g | ORAL_CAPSULE | Freq: Two times a day (BID) | ORAL | 3 refills | Status: DC

## 2024-06-14 NOTE — Telephone Encounter (Signed)
 Pt c/o medication issue:  1. Name of Medication:   icosapent  Ethyl (VASCEPA ) 1 g capsule Take 2 capsules (2 g total) by mouth 2 (two) times daily.    2. How are you currently taking this medication (dosage and times per day)? As written   3. Are you having a reaction (difficulty breathing--STAT)? No   4. What is your medication issue? Pt called in stating she is unable to afford copay on this new med. She asked if Dr. Tolia can start her on something else or is there any coupons or something she can use.

## 2024-06-15 ENCOUNTER — Other Ambulatory Visit (HOSPITAL_COMMUNITY): Payer: Self-pay

## 2024-06-15 ENCOUNTER — Telehealth: Payer: Self-pay

## 2024-06-15 MED ORDER — VASCEPA 1 G PO CAPS
2.0000 g | ORAL_CAPSULE | Freq: Two times a day (BID) | ORAL | 6 refills | Status: DC
  Filled 2024-06-15: qty 120, 30d supply, fill #0

## 2024-06-15 NOTE — Telephone Encounter (Signed)
 Spoke to patient Pharm D tech's advice given.Vascepa  prescription sent to [facility] pharmacy.

## 2024-06-15 NOTE — Telephone Encounter (Signed)
 Pharmacy Patient Advocate Encounter  Insurance verification completed.   The patient is insured through [employer] test claim for VASCEPA . Currently a quantity of 120 is a 30 day supply and the co-pay is 0 . The current 30 day co-pay is, $0.  No PA needed at this time.  This test claim was processed through [facility]- copay amounts may vary at other pharmacies due to pharmacy/plan contracts, or as the patient moves through the different stages of their insurance plan.    BRAND VASCEPA  COVERED AT NO COST TO PT

## 2024-06-23 ENCOUNTER — Inpatient Hospital Stay: Attending: Hematology and Oncology

## 2024-06-23 ENCOUNTER — Telehealth: Payer: Self-pay | Admitting: Cardiology

## 2024-06-23 ENCOUNTER — Other Ambulatory Visit: Payer: Self-pay | Admitting: Hematology and Oncology

## 2024-06-23 ENCOUNTER — Inpatient Hospital Stay (HOSPITAL_BASED_OUTPATIENT_CLINIC_OR_DEPARTMENT_OTHER): Admitting: Hematology and Oncology

## 2024-06-23 VITALS — BP 110/62 | HR 74 | Temp 97.2°F | Resp 13 | Wt 148.6 lb

## 2024-06-23 DIAGNOSIS — K5090 Crohn's disease, unspecified, without complications: Secondary | ICD-10-CM | POA: Diagnosis not present

## 2024-06-23 DIAGNOSIS — E781 Pure hyperglyceridemia: Secondary | ICD-10-CM

## 2024-06-23 DIAGNOSIS — D500 Iron deficiency anemia secondary to blood loss (chronic): Secondary | ICD-10-CM | POA: Diagnosis not present

## 2024-06-23 LAB — CBC WITH DIFFERENTIAL (CANCER CENTER ONLY)
Abs Immature Granulocytes: 0.02 K/uL (ref 0.00–0.07)
Basophils Absolute: 0.1 K/uL (ref 0.0–0.1)
Basophils Relative: 1 %
Eosinophils Absolute: 0.2 K/uL (ref 0.0–0.5)
Eosinophils Relative: 2 %
HCT: 37.7 % (ref 36.0–46.0)
Hemoglobin: 11.5 g/dL — ABNORMAL LOW (ref 12.0–15.0)
Immature Granulocytes: 0 %
Lymphocytes Relative: 28 %
Lymphs Abs: 2.6 K/uL (ref 0.7–4.0)
MCH: 22.7 pg — ABNORMAL LOW (ref 26.0–34.0)
MCHC: 30.5 g/dL (ref 30.0–36.0)
MCV: 74.5 fL — ABNORMAL LOW (ref 80.0–100.0)
Monocytes Absolute: 0.6 K/uL (ref 0.1–1.0)
Monocytes Relative: 7 %
Neutro Abs: 5.7 K/uL (ref 1.7–7.7)
Neutrophils Relative %: 62 %
Platelet Count: 403 K/uL — ABNORMAL HIGH (ref 150–400)
RBC: 5.06 MIL/uL (ref 3.87–5.11)
RDW: 20.7 % — ABNORMAL HIGH (ref 11.5–15.5)
WBC Count: 9.1 K/uL (ref 4.0–10.5)
nRBC: 0 % (ref 0.0–0.2)

## 2024-06-23 LAB — RETIC PANEL
Immature Retic Fract: 8.2 % (ref 2.3–15.9)
RBC.: 5.07 MIL/uL (ref 3.87–5.11)
Retic Count, Absolute: 49.7 K/uL (ref 19.0–186.0)
Retic Ct Pct: 1 % (ref 0.4–3.1)
Reticulocyte Hemoglobin: 29.1 pg (ref >27.9)

## 2024-06-23 LAB — IRON AND IRON BINDING CAPACITY (CC-WL,HP ONLY)
Iron: 40 ug/dL (ref 28–170)
Saturation Ratios: 8 % — ABNORMAL LOW (ref 10.4–31.8)
TIBC: 501 ug/dL — ABNORMAL HIGH (ref 250–450)
UIBC: 461 ug/dL — ABNORMAL HIGH (ref 148–442)

## 2024-06-23 LAB — CMP (CANCER CENTER ONLY)
ALT: 25 U/L (ref 0–44)
AST: 22 U/L (ref 15–41)
Albumin: 4.6 g/dL (ref 3.5–5.0)
Alkaline Phosphatase: 111 U/L (ref 38–126)
Anion gap: 7 (ref 5–15)
BUN: 12 mg/dL (ref 8–23)
CO2: 27 mmol/L (ref 22–32)
Calcium: 9.7 mg/dL (ref 8.9–10.3)
Chloride: 106 mmol/L (ref 98–111)
Creatinine: 0.61 mg/dL (ref 0.44–1.00)
GFR, Estimated: >60 mL/min (ref >60)
Glucose, Bld: 133 mg/dL — ABNORMAL HIGH (ref 70–99)
Potassium: 4.3 mmol/L (ref 3.5–5.1)
Sodium: 140 mmol/L (ref 135–145)
Total Bilirubin: 0.2 mg/dL (ref 0.0–1.2)
Total Protein: 7.2 g/dL (ref 6.5–8.1)

## 2024-06-23 LAB — FERRITIN: Ferritin: 22 ng/mL (ref 11–307)

## 2024-06-23 MED ORDER — FENOFIBRATE 145 MG PO TABS: 145.0000 mg | ORAL_TABLET | Freq: Every day | ORAL | 3 refills | Status: AC

## 2024-06-23 NOTE — Telephone Encounter (Signed)
 Pt c/o medication issue:  1. Name of Medication: Fish Oil  2. How are you currently taking this medication (dosage and times per day)?   3. Are you having a reaction (difficulty breathing--STAT)? No  4. What is your medication issue? Pt states that medication causes her to become nauseous and sick. She would like a c/b regarding this matter. Please advise
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
## 2024-06-23 NOTE — Telephone Encounter (Signed)
 Spoke with pt, aware of dr michele recommendations. New script sent to the pharmacy

## 2024-06-23 NOTE — Telephone Encounter (Signed)
 Her recent TAG is 262mg /dL.  She can stop Vascepa  and go back to Fenofibrate .  I did look up adverse reaction and Vascepa  should not cause n/v.  I think she is having difficulty swallowing the tablets which is causing her to have n/v.  Reduce foods high in TAG to help optimize her labs (TAG should be 149mg /dL).   Roderick Calo Cedar, DO, FACC

## 2024-06-23 NOTE — Telephone Encounter (Signed)
 Called and spoke with patient. Patient states she does not want to take her Vascepa  anymore. States the medication is making her nauseous and sick. States the pills are too large and she has a hard time getting them down. States she last took the medication last night. Advised patient this note would be routed to Dr. Michele to advise. Patient verbalized understanding and agreeable to plan.

## 2024-06-24 ENCOUNTER — Telehealth: Payer: Self-pay | Admitting: *Deleted

## 2024-06-24 NOTE — Telephone Encounter (Signed)
 Received vm from pt. She states she now wants IV iron  sooner rather than later. She had initially stated she would want it later in September but now thinks she needs it sooner.  Please advise

## 2024-06-29 ENCOUNTER — Other Ambulatory Visit: Payer: Self-pay | Admitting: Hematology and Oncology

## 2024-06-30 ENCOUNTER — Telehealth (HOSPITAL_COMMUNITY): Payer: Self-pay | Admitting: Pharmacy Technician

## 2024-06-30 ENCOUNTER — Other Ambulatory Visit: Payer: Self-pay | Admitting: Hematology and Oncology

## 2024-06-30 NOTE — Telephone Encounter (Signed)
 Dr Federico,   Monoferric is non-preferred on patients insurance and did not meet criteria for auth.  Feraheme is preferred and no shara is needed. Would you like to change to preferred med?   Auth Submission: NO AUTH NEEDED Site of care: [facility] Payer: [employer] Medicare Medication & CPT/J Code(s) submitted: Feraheme (ferumoxytol) [id number] Diagnosis Code: D50.0 Route of submission (phone, fax, portal):  Phone # Fax # Auth type: Buy/Bill HB Units/visits requested: 510mg  x 2 doses Reference number:  Approval from: 06/30/24 to 11/02/24    Dagoberto Armour, CPhT [facility] Phone: [phone] 06/30/2024

## 2024-07-01 ENCOUNTER — Telehealth: Payer: Self-pay

## 2024-07-01 NOTE — Telephone Encounter (Signed)
 Dr. Federico, patient will be scheduled as soon as possible.  Auth Submission: NO AUTH NEEDED Site of care: Site of care: [facility] Payer: [employer] dual complete medicare Medication & CPT/J Code(s) submitted: Feraheme (ferumoxytol) [id number] Diagnosis Code:  Route of submission (phone, fax, portal):  Phone # Fax # Auth type: Buy/Bill PB Units/visits requested: 510mg  x 2 doses Reference number:  Approval from: 07/01/24 to 10/01/24

## 2024-07-04 ENCOUNTER — Other Ambulatory Visit: Payer: Self-pay | Admitting: Allergy & Immunology

## 2024-07-07 ENCOUNTER — Ambulatory Visit: Admitting: *Deleted

## 2024-07-07 VITALS — BP 137/81 | HR 80 | Temp 97.8°F | Resp 16 | Ht 64.0 in | Wt 148.2 lb

## 2024-07-07 DIAGNOSIS — D500 Iron deficiency anemia secondary to blood loss (chronic): Secondary | ICD-10-CM

## 2024-07-07 DIAGNOSIS — K5010 Crohn's disease of large intestine without complications: Secondary | ICD-10-CM | POA: Diagnosis not present

## 2024-07-07 MED ORDER — SODIUM CHLORIDE 0.9 % IV SOLN
510.0000 mg | Freq: Once | INTRAVENOUS | Status: AC
  Administered 2024-07-07: 510 mg via INTRAVENOUS
  Filled 2024-07-07: qty 17

## 2024-07-07 NOTE — Progress Notes (Signed)
 Diagnosis: Iron  Deficiency Anemia  Provider:  Mannam, Praveen MD  Procedure: IV Infusion  IV Type: Peripheral, IV Location: R Antecubital  Feraheme (Ferumoxytol ), Dose: 510 mg  Infusion Start Time: 1137 am  Infusion Stop Time: 1154 am  Post Infusion IV Care: Observation period completed and Peripheral IV Discontinued  Discharge: Condition: Good, Destination: Home . AVS Provided  Performed by:  Trudy Lamarr LABOR, RN

## 2024-07-11 ENCOUNTER — Encounter: Payer: Self-pay | Admitting: "Endocrinology

## 2024-07-11 ENCOUNTER — Ambulatory Visit (INDEPENDENT_AMBULATORY_CARE_PROVIDER_SITE_OTHER): Admitting: "Endocrinology

## 2024-07-11 VITALS — BP 120/84 | HR 84 | Ht 64.0 in | Wt 148.0 lb

## 2024-07-11 DIAGNOSIS — E782 Mixed hyperlipidemia: Secondary | ICD-10-CM

## 2024-07-11 DIAGNOSIS — Z7984 Long term (current) use of oral hypoglycemic drugs: Secondary | ICD-10-CM

## 2024-07-11 DIAGNOSIS — E119 Type 2 diabetes mellitus without complications: Secondary | ICD-10-CM | POA: Diagnosis not present

## 2024-07-11 LAB — POCT GLYCOSYLATED HEMOGLOBIN (HGB A1C): Hemoglobin A1C: 6.5 % — AB (ref 4.0–5.6)
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
## 2024-07-11 NOTE — Patient Instructions (Signed)

## 2024-07-12 ENCOUNTER — Ambulatory Visit (INDEPENDENT_AMBULATORY_CARE_PROVIDER_SITE_OTHER)

## 2024-07-12 DIAGNOSIS — J309 Allergic rhinitis, unspecified: Secondary | ICD-10-CM

## 2024-07-14 ENCOUNTER — Other Ambulatory Visit: Payer: Self-pay

## 2024-07-14 ENCOUNTER — Emergency Department (HOSPITAL_COMMUNITY)
Admission: EM | Admit: 2024-07-14 | Discharge: 2024-07-14 | Disposition: A | Discharge status: Home / Self Care | Source: Ambulatory Visit | Attending: Emergency Medicine | Admitting: Emergency Medicine

## 2024-07-14 ENCOUNTER — Encounter (HOSPITAL_COMMUNITY): Payer: Self-pay

## 2024-07-14 ENCOUNTER — Ambulatory Visit (INDEPENDENT_AMBULATORY_CARE_PROVIDER_SITE_OTHER)

## 2024-07-14 VITALS — BP 169/89 | HR 91 | Temp 97.4°F | Resp 22 | Ht 64.0 in | Wt 148.2 lb

## 2024-07-14 DIAGNOSIS — D500 Iron deficiency anemia secondary to blood loss (chronic): Secondary | ICD-10-CM

## 2024-07-14 DIAGNOSIS — T50905A Adverse effect of unspecified drugs, medicaments and biological substances, initial encounter: Secondary | ICD-10-CM

## 2024-07-14 DIAGNOSIS — K5010 Crohn's disease of large intestine without complications: Secondary | ICD-10-CM | POA: Diagnosis not present

## 2024-07-14 DIAGNOSIS — T450X5A Adverse effect of antiallergic and antiemetic drugs, initial encounter: Secondary | ICD-10-CM | POA: Diagnosis not present

## 2024-07-14 DIAGNOSIS — T454X5A Adverse effect of iron and its compounds, initial encounter: Secondary | ICD-10-CM | POA: Diagnosis not present

## 2024-07-14 DIAGNOSIS — R42 Dizziness and giddiness: Secondary | ICD-10-CM | POA: Insufficient documentation

## 2024-07-14 LAB — CBC
HCT: 39.5 % (ref 36.0–46.0)
Hemoglobin: 11.7 g/dL — ABNORMAL LOW (ref 12.0–15.0)
MCH: 23.4 pg — ABNORMAL LOW (ref 26.0–34.0)
MCHC: 29.6 g/dL — ABNORMAL LOW (ref 30.0–36.0)
MCV: 79 fL — ABNORMAL LOW (ref 80.0–100.0)
Platelets: 324 K/uL (ref 150–400)
RBC: 5 MIL/uL (ref 3.87–5.11)
RDW: 19.3 % — ABNORMAL HIGH (ref 11.5–15.5)
WBC: 12.1 K/uL — ABNORMAL HIGH (ref 4.0–10.5)
nRBC: 0 % (ref 0.0–0.2)

## 2024-07-14 LAB — BASIC METABOLIC PANEL WITH GFR
Anion gap: 14 (ref 5–15)
BUN: 13 mg/dL (ref 8–23)
CO2: 23 mmol/L (ref 22–32)
Calcium: 9.8 mg/dL (ref 8.9–10.3)
Chloride: 104 mmol/L (ref 98–111)
Creatinine, Ser: 0.72 mg/dL (ref 0.44–1.00)
GFR, Estimated: >60 mL/min (ref >60)
Glucose, Bld: 198 mg/dL — ABNORMAL HIGH (ref 70–99)
Potassium: 4.5 mmol/L (ref 3.5–5.1)
Sodium: 140 mmol/L (ref 135–145)

## 2024-07-14 LAB — TROPONIN T, HIGH SENSITIVITY: Troponin T High Sensitivity: 17 ng/L (ref 0–19)

## 2024-07-14 MED ORDER — SODIUM CHLORIDE 0.9 % IV SOLN: Freq: Once | INTRAVENOUS | Status: AC | PRN

## 2024-07-14 MED ORDER — METHYLPREDNISOLONE SODIUM SUCC 125 MG IJ SOLR
125.0000 mg | Freq: Once | INTRAMUSCULAR | Status: AC | PRN
  Administered 2024-07-14: 125 mg via INTRAVENOUS

## 2024-07-14 MED ORDER — SODIUM CHLORIDE 0.9 % IV SOLN
510.0000 mg | Freq: Once | INTRAVENOUS | Status: AC
  Administered 2024-07-14: 510 mg via INTRAVENOUS
  Filled 2024-07-14: qty 17

## 2024-07-14 MED ORDER — DIPHENHYDRAMINE HCL 50 MG/ML IJ SOLN
50.0000 mg | Freq: Once | INTRAMUSCULAR | Status: AC | PRN
  Administered 2024-07-14: 50 mg via INTRAVENOUS

## 2024-07-14 MED ORDER — EPINEPHRINE 0.3 MG/0.3ML IJ SOAJ
0.3000 mg | Freq: Once | INTRAMUSCULAR | Status: DC | PRN
  Administered 2024-07-14: 0.3 mg via INTRAMUSCULAR

## 2024-07-14 MED ORDER — FAMOTIDINE IN NACL 20-0.9 MG/50ML-% IV SOLN
20.0000 mg | Freq: Once | INTRAVENOUS | Status: AC | PRN
  Administered 2024-07-14: 20 mg via INTRAVENOUS

## 2024-07-14 MED ORDER — ALBUTEROL SULFATE HFA 108 (90 BASE) MCG/ACT IN AERS: 2.0000 | INHALATION_SPRAY | Freq: Once | RESPIRATORY_TRACT | Status: DC | PRN

## 2024-07-14 NOTE — Progress Notes (Signed)
 Diagnosis: Iron  Deficiency Anemia  Provider:  Praveen Mannam MD  Procedure: IV Infusion  IV Type: Peripheral, IV Location: R Antecubital  Feraheme (Ferumoxytol ), Dose: 510 mg  Infusion Start Time: 1132  Infusion Stop Time: 1134  Post Infusion IV Care: Patient experienced symptoms. See note below.  Discharge: Condition: Fair, Destination: [facility] .   Performed by:  Maximiano JONELLE Pouch, LPN   At 8865, soon after infusion was initiated, patient complained of symptoms including breathing difficulty, chest tightness, shortness of breath, and a little bit of chest pain. Stated she felt warm/flushed, and c/o severe, 10/10 back pain in mid-lower back. She stated she just didn't feel good. Emergency protocols initiated and emergency medications administered including Benadryl  50 mg IVP at 1141, Solumedrol 125 mg IVP at 1139, NS 1000 mL bolus at 1138, Pepcid  20 mg IVPB at 1143, and Epi-pen 0.3 mg at 1136 . Vital signs stable. EMS called at 1138 and arrived at 1158. By approximately 8857, patient stated that symptoms had improved, pain was 5/10. By 8851, patient stated that all symptoms had resolved and she had no pain. Ordering provider Dr. Norleen Kidney notified via secure message at 1151, and responded at 1201. Additional orders were not given. Patient stated she did not need staff to notify any family members or friends. Medical director Lonna Coder, MD notified via secure message at 1151. Patient was transported to [facility] via EMS at 1222.   Rocky FORBES Sar, RN

## 2024-07-14 NOTE — Discharge Instructions (Addendum)
 Watch for return of symptoms.  Follow-up with your doctors as needed

## 2024-07-14 NOTE — ED Provider Notes (Signed)
  EMERGENCY DEPARTMENT AT [facility] Provider Note   CSN: [id number] Arrival date & time: 07/14/24  1247     Patient presents with: Allergic Reaction and Dizziness   Sheila Vincent is a 63 y.o. female.    Allergic Reaction Dizziness Patient was getting a Feraheme infusion today.  Notable for noon started to get the infusion began to feel bad.  Back pain chest pain and feeling flushed.  Had epi Benadryl  Pepcid .  Had some saline.  Still feeling a little dizzy.  Has not had this Feraheme before but has had previous iron  infusions.  Feeling somewhat better now.     Prior to Admission medications   Medication Sig Start Date End Date Taking? Authorizing Provider  Accu-Chek Softclix Lancets lancets every morning. 09/18/23   [provider]  albuterol  (VENTOLIN  HFA) 108 (90 Base) MCG/ACT inhaler INHALE 2 PUFFS INTO THE LUNGS EVERY 6 HOURS AS NEEDED FOR WHEEZING OR SHORTNESS OF BREATH 03/23/23   Iva Marty Saltness, MD  amLODipine  (NORVASC ) 5 MG tablet Take 5 mg by mouth daily.    [provider]  atorvastatin  (LIPITOR) 40 MG tablet Take 40 mg by mouth at bedtime.    [provider]  azelastine  (ASTELIN ) 0.1 % nasal spray PLACE 2 SPRAY IN EACH NOSTRIL TWICE DAILY AS DIRECTED 11/10/23   Iva Marty Saltness, MD  baclofen  (LIORESAL ) 10 MG tablet Take 1 tablet (10 mg total) by mouth 3 (three) times daily. 03/26/24   Johnie Flaming A, NP  Blood Glucose Monitoring Suppl (ACCU-CHEK GUIDE) w/Device KIT USE TO CHECK BLOOD SUGAR DAILY 07/17/22   [provider]  cetirizine  (ZYRTEC ) 10 MG tablet Take 1 tablet (10 mg total) by mouth in the morning and at bedtime. 01/19/24 07/11/24  Iva Marty Saltness, MD  colchicine  0.6 MG tablet Take 2 tablets of colchicine  first and then take 1 tablet in an hour if symptoms persist 04/09/24   Johnie Flaming A, NP  doxycycline  (VIBRAMYCIN ) 100 MG capsule Take 1 capsule (100 mg total) by mouth 2 (two) times daily.  12/27/23   Raspet, Erin K, PA-C  fenofibrate  (TRICOR ) 145 MG tablet Take 1 tablet (145 mg total) by mouth daily. 06/23/24   Tolia, Sunit, DO  FERRETTS 325 (106 Fe) MG TABS tablet Take 1 tablet by mouth daily. 08/27/23   [provider]  fexofenadine (ALLEGRA) 180 MG tablet Take 180 mg by mouth daily as needed for allergies.    [provider]  fluticasone  (FLONASE ) 50 MCG/ACT nasal spray Place 2 sprays into both nostrils daily. 11/10/23   Iva Marty Saltness, MD  glucose blood test strip TEST BLOOD SUGAR EVERY MORNING BEFORE BREAKFAST 06/03/23   [provider]  ipratropium (ATROVENT) 0.06 % nasal spray Place 2 sprays into both nostrils 4 (four) times daily. 05/23/23   [provider]  levocetirizine (XYZAL ) 5 MG tablet Take 1 tablet (5 mg total) by mouth every evening. 11/10/23 07/11/24  Iva Marty Saltness, MD  lidocaine  4 % Place 1 patch onto the skin daily. 03/26/24   Johnie Flaming A, NP  mesalamine  (LIALDA ) 1.2 g EC tablet Take 4 tablets (4.8 g total) by mouth daily with breakfast. Please schedule a follow up visit for additional refills. Thank you 03/29/24   Kennedy-Smith, Colleen M, NP  metFORMIN  (GLUCOPHAGE ) 500 MG tablet Take 500 mg by mouth 2 (two) times daily. 07/17/22   [provider]  montelukast  (SINGULAIR ) 10 MG tablet Take 1 tablet (10 mg total) by mouth at  bedtime. 01/19/24   Iva Marty Saltness, MD  olmesartan (BENICAR) 20 MG tablet Take 20 mg by mouth daily.    [provider]  omeprazole  (PRILOSEC) 40 MG capsule TAKE 1 CAPSULE(40 MG) BY MOUTH DAILY 07/07/24   Iva Marty Saltness, MD  pregabalin  (LYRICA ) 75 MG capsule Take 1 capsule (75 mg total) by mouth 2 (two) times daily. 02/17/24 07/11/24  Georgina Ozell LABOR, MD  triamcinolone  ointment (KENALOG ) 0.5 % Apply 2-3 times daily to your fingers to see if this helps with wound healing. 01/19/24   Iva Marty Saltness, MD    Allergies: Feraheme [ferumoxytol ], Sulfa antibiotics,  Sulfasalazine , and Bactrim    Review of Systems  Neurological:  Positive for dizziness.    Updated Vital Signs BP (!) 140/60   Pulse 82   Temp 97.6 F (36.4 C)   Resp 20   Ht 5' 4 (1.626 m)   Wt 67.2 kg   SpO2 100%   BMI 25.44 kg/m   Physical Exam Vitals and nursing note reviewed.  Cardiovascular:     Rate and Rhythm: Regular rhythm.  Pulmonary:     Breath sounds: No wheezing or rhonchi.  Abdominal:     Tenderness: There is no abdominal tenderness.  Skin:    Capillary Refill: Capillary refill takes less than 2 seconds.  Neurological:     Mental Status: She is alert and oriented to person, place, and time.     (all labs ordered are listed, but only abnormal results are displayed) Labs Reviewed  CBC - Abnormal; Notable for the following components:      Result Value   WBC 12.1 (*)    Hemoglobin 11.7 (*)    MCV 79.0 (*)    MCH 23.4 (*)    MCHC 29.6 (*)    RDW 19.3 (*)    All other components within normal limits  BASIC METABOLIC PANEL WITH GFR - Abnormal; Notable for the following components:   Glucose, Bld 198 (*)    All other components within normal limits  TROPONIN T, HIGH SENSITIVITY  TROPONIN T, HIGH SENSITIVITY    EKG: EKG Interpretation Date/Time:  Thursday July 14 2024 13:44:10 EDT Ventricular Rate:  76 PR Interval:  164 QRS Duration:  90 QT Interval:  404 QTC Calculation: 455 R Axis:   60  Text Interpretation: Sinus rhythm Nonspecific T abnormalities, lateral leads No significant change since last tracing Confirmed by Patsey Lot [id number]) on 07/14/2024 2:02:50 PM  Radiology: No results found.   Procedures   Medications Ordered in the ED - No data to display                                  Medical Decision Making Amount and/or Complexity of Data Reviewed Labs: ordered.   Patient with near syncopal episode.  Felt lightheaded dizzy and reportedly was diaphoretic.  Was getting Feraheme infusion.  Potential allergic  reaction.  Also versus arrhythmia.  Feeling somewhat better now.  Will get basic blood work.  Will get troponin.  Will continue to monitor.  Recent hemoglobin was 11.  Workup reassuring.     Final diagnoses:  Adverse effect of drug, initial encounter    ED Discharge Orders     None          Patsey Lot, MD 07/14/24 [id number]

## 2024-07-14 NOTE — ED Triage Notes (Addendum)
 Sheila Vincent Pulmonary getting Feraheme Infusion Couple min after getting it felt warm flushed. Back pain 10/10 said she felt full'. Reported chest pain. Given 3.3 Epi IM at 1136 125 solu medrol  50 mg benadryl  20mg  Pepsid. 250 NS. Symptoms resolved 1148. Pt reporting dizziness same sitting and standing. Hx diabetes 170/80 BP 80 HR 100% RA 202 CBG

## 2024-07-15 ENCOUNTER — Telehealth: Payer: Self-pay | Admitting: Hematology and Oncology

## 2024-07-15 ENCOUNTER — Telehealth: Payer: Self-pay

## 2024-07-15 NOTE — Telephone Encounter (Signed)
 Pt called stating she had an IV Iron  infusion and had a reaction.  Pt requesting if she could be seen before her current scheduled appt in November to September.  Pt stated she's had IV Iron  in the past and never has a reaction but this time she was ordered a different type of IV Iron .  Pt stated she got 5 mins into the infusion before having chest pain, SOB, and ended up getting an Epipen  injection.  Pt stated the EMS was called and pt was transferred to the ED for further evaluation.  Pt stated she's feeling much better today.  Pt stated she would like to discuss with Dr. Federico the change in her IV Iron  and would like to request an appt to further discuss IV Iron  options.  Notified Dr. Federico and Shanda Pope Scheduler for Dr. Federico.

## 2024-07-18 ENCOUNTER — Ambulatory Visit: Admitting: "Endocrinology

## 2024-07-19 ENCOUNTER — Other Ambulatory Visit: Payer: Self-pay | Admitting: Hematology and Oncology

## 2024-07-19 DIAGNOSIS — D500 Iron deficiency anemia secondary to blood loss (chronic): Secondary | ICD-10-CM

## 2024-07-20 ENCOUNTER — Inpatient Hospital Stay: Admitting: Hematology and Oncology

## 2024-07-20 ENCOUNTER — Telehealth: Payer: Self-pay | Admitting: Hematology and Oncology

## 2024-07-20 ENCOUNTER — Inpatient Hospital Stay: Attending: Hematology and Oncology

## 2024-07-20 DIAGNOSIS — D500 Iron deficiency anemia secondary to blood loss (chronic): Secondary | ICD-10-CM | POA: Insufficient documentation

## 2024-07-20 DIAGNOSIS — K5090 Crohn's disease, unspecified, without complications: Secondary | ICD-10-CM | POA: Insufficient documentation

## 2024-07-20 LAB — CBC WITH DIFFERENTIAL (CANCER CENTER ONLY)
Abs Immature Granulocytes: 0.04 K/uL (ref 0.00–0.07)
Basophils Absolute: 0.1 K/uL (ref 0.0–0.1)
Basophils Relative: 1 %
Eosinophils Absolute: 0.2 K/uL (ref 0.0–0.5)
Eosinophils Relative: 2 %
HCT: 37.6 % (ref 36.0–46.0)
Hemoglobin: 12 g/dL (ref 12.0–15.0)
Immature Granulocytes: 0 %
Lymphocytes Relative: 24 %
Lymphs Abs: 2.6 K/uL (ref 0.7–4.0)
MCH: 24.4 pg — ABNORMAL LOW (ref 26.0–34.0)
MCHC: 31.9 g/dL (ref 30.0–36.0)
MCV: 76.6 fL — ABNORMAL LOW (ref 80.0–100.0)
Monocytes Absolute: 0.8 K/uL (ref 0.1–1.0)
Monocytes Relative: 8 %
Neutro Abs: 7 K/uL (ref 1.7–7.7)
Neutrophils Relative %: 65 %
Platelet Count: 339 K/uL (ref 150–400)
RBC: 4.91 MIL/uL (ref 3.87–5.11)
RDW: 20 % — ABNORMAL HIGH (ref 11.5–15.5)
WBC Count: 10.6 K/uL — ABNORMAL HIGH (ref 4.0–10.5)
nRBC: 0 % (ref 0.0–0.2)

## 2024-07-20 LAB — CMP (CANCER CENTER ONLY)
ALT: 28 U/L (ref 0–44)
AST: 17 U/L (ref 15–41)
Albumin: 4.4 g/dL (ref 3.5–5.0)
Alkaline Phosphatase: 108 U/L (ref 38–126)
Anion gap: 6 (ref 5–15)
BUN: 17 mg/dL (ref 8–23)
CO2: 28 mmol/L (ref 22–32)
Calcium: 9.8 mg/dL (ref 8.9–10.3)
Chloride: 105 mmol/L (ref 98–111)
Creatinine: 0.73 mg/dL (ref 0.44–1.00)
GFR, Estimated: >60 mL/min (ref >60)
Glucose, Bld: 131 mg/dL — ABNORMAL HIGH (ref 70–99)
Potassium: 4.2 mmol/L (ref 3.5–5.1)
Sodium: 139 mmol/L (ref 135–145)
Total Bilirubin: 0.3 mg/dL (ref 0.0–1.2)
Total Protein: 7.1 g/dL (ref 6.5–8.1)

## 2024-07-20 LAB — RETIC PANEL
Immature Retic Fract: 6.4 % (ref 2.3–15.9)
RBC.: 4.99 MIL/uL (ref 3.87–5.11)
Retic Count, Absolute: 82.8 K/uL (ref 19.0–186.0)
Retic Ct Pct: 1.7 % (ref 0.4–3.1)
Reticulocyte Hemoglobin: 33.3 pg (ref >27.9)

## 2024-07-20 LAB — IRON AND IRON BINDING CAPACITY (CC-WL,HP ONLY)
Iron: 97 ug/dL (ref 28–170)
Saturation Ratios: 24 % (ref 10.4–31.8)
TIBC: 399 ug/dL (ref 250–450)
UIBC: 302 ug/dL (ref 148–442)

## 2024-07-20 LAB — FERRITIN: Ferritin: 318 ng/mL — ABNORMAL HIGH (ref 11–307)

## 2024-07-21 ENCOUNTER — Ambulatory Visit (INDEPENDENT_AMBULATORY_CARE_PROVIDER_SITE_OTHER): Admitting: Allergy & Immunology

## 2024-07-21 ENCOUNTER — Encounter: Payer: Self-pay | Admitting: Allergy & Immunology

## 2024-07-21 ENCOUNTER — Other Ambulatory Visit: Payer: Self-pay

## 2024-07-21 VITALS — BP 124/82 | HR 82 | Temp 97.4°F | Resp 18 | Ht 61.81 in | Wt 148.3 lb

## 2024-07-21 DIAGNOSIS — J3489 Other specified disorders of nose and nasal sinuses: Secondary | ICD-10-CM

## 2024-07-21 DIAGNOSIS — T7840XD Allergy, unspecified, subsequent encounter: Secondary | ICD-10-CM

## 2024-07-21 DIAGNOSIS — K219 Gastro-esophageal reflux disease without esophagitis: Secondary | ICD-10-CM

## 2024-07-21 DIAGNOSIS — J4540 Moderate persistent asthma, uncomplicated: Secondary | ICD-10-CM

## 2024-07-21 DIAGNOSIS — J302 Other seasonal allergic rhinitis: Secondary | ICD-10-CM

## 2024-07-21 DIAGNOSIS — E1169 Type 2 diabetes mellitus with other specified complication: Secondary | ICD-10-CM

## 2024-07-21 DIAGNOSIS — B999 Unspecified infectious disease: Secondary | ICD-10-CM

## 2024-07-21 DIAGNOSIS — J3089 Other allergic rhinitis: Secondary | ICD-10-CM

## 2024-07-21 NOTE — Patient Instructions (Addendum)
 1. Moderate persistent asthma, uncomplicated - Spirometry looks great today which is good news.  - Daily controller medication(s): Singulair  (montelukast ) 10mg  daily - Prior to physical activity: albuterol  2 puffs 10-15 minutes before physical activity. - Rescue medications: albuterol  4 puffs every 4-6 hours as needed - Changes during respiratory infections or worsening symptoms: Add on Symbicort  to 2 puffs twice daily for TWO WEEKS. - Asthma control goals:  * Full participation in all desired activities (may need albuterol  before activity) * Albuterol  use two time or less a week on average (not counting use with activity) * Cough interfering with sleep two time or less a month * Oral steroids no more than once a year * No hospitalizations  2. Perennial and seasonal allergic rhinitis - Continue with all of the nose sprays as needed.  - Continue with your cetirizine  and the montelukast .  - Continue with the allergy  shots at the same schedule.   3. Recurrent infections - Most recent workup was fairly reassuring.  - I do not think that we need to do anything else with regards to your immune system.  4. Hand and finger dermatitis - with wound healing problems - This looks a lot better.  - Continue with the triamcinolone  as needed.   6. Diabetes  - We are going to get a hemoglobin A1c.   7. Iron  deficiency anemia - from Crohn's disease - Ask your hematologist whether you can have Venofer  (Iron  Sucrose) instead. - This is what you tolerated back in 2022.   8. Return in about 6 months (around 01/18/2025). You can have the follow up appointment with Dr. Iva or a Nurse Practicioner (our Nurse Practitioners are excellent and always have Physician oversight!).    Please inform us  of any Emergency Department visits, hospitalizations, or changes in symptoms. Call us  before going to the ED for breathing or allergy  symptoms since we might be able to fit you in for a sick visit. Feel free to  contact us  anytime with any questions, problems, or concerns.  It was a pleasure to see you again today!  Websites that have reliable patient information: 1. American Academy of Asthma, Allergy , and Immunology: www.aaaai.org 2. Food Allergy  Research and Education (FARE): foodallergy.org 3. Mothers of Asthmatics: http://www.asthmacommunitynetwork.org 4. American College of Allergy , Asthma, and Immunology: www.acaai.org      "Like" us  on Facebook and Instagram for our latest updates!      A healthy democracy works best when [employer] participate! Make sure you are registered to vote! If you have moved or changed any of your contact information, you will need to get this updated before voting! Scan the QR codes below to learn more!

## 2024-07-21 NOTE — Progress Notes (Unsigned)
 FOLLOW UP  Date of Service/Encounter:  07/21/24   Assessment:   Moderate persistent asthma, uncomplicated   Perennial and seasonal allergic rhinitis (grasses, ragweed, weeds, molds, cat, dog, dust mite) - on allergen immunotherapy (started June 2019 and reached maintenance in October 2019), restarted new vials in November 2024 and reached maintenance in January 2025   Chronic nasal congestion - with mostly normal sinus CT (consider migraine eval?)  Adverse reaction to Feraheme - recommended using Venofer  instead (since she has tolerated that in the past) and slowing infusion time    Heart murmur and hypertension - followed by Cardiology   Iron  deficiency anemia - followed by hematology   Inflammatory bowel disease - followed by Dr. Eda   GERD   Disabled status  Plan/Recommendations:   1. Moderate persistent asthma, uncomplicated - Spirometry looks great today which is good news.  - Daily controller medication(s): Singulair  (montelukast ) 10mg  daily - Prior to physical activity: albuterol  2 puffs 10-15 minutes before physical activity. - Rescue medications: albuterol  4 puffs every 4-6 hours as needed - Changes during respiratory infections or worsening symptoms: Add on Symbicort  to 2 puffs twice daily for TWO WEEKS. - Asthma control goals:  * Full participation in all desired activities (may need albuterol  before activity) * Albuterol  use two time or less a week on average (not counting use with activity) * Cough interfering with sleep two time or less a month * Oral steroids no more than once a year * No hospitalizations  2. Perennial and seasonal allergic rhinitis - Continue with all of the nose sprays as needed.  - Continue with your cetirizine  and the montelukast .  - Continue with the allergy  shots at the same schedule.   3. Recurrent infections - Most recent workup was fairly reassuring.  - I do not think that we need to do anything else with regards to your  immune system.  4. Hand and finger dermatitis - with wound healing problems - This looks a lot better.  - Continue with the triamcinolone  as needed.   6. Diabetes  - We are going to get a hemoglobin A1c.   7. Iron  deficiency anemia - from Crohn's disease - Ask your hematologist whether you can have Venofer  (Iron  Sucrose) instead. - This is what you tolerated back in 2022.   8. Return in about 6 months (around 01/18/2025). You can have the follow up appointment with Dr. Iva or a Nurse Practicioner (our Nurse Practitioners are excellent and always have Physician oversight!).   Subjective:   Sheila Vincent is a 63 y.o. female presenting today for follow up of  Chief Complaint  Patient presents with   Follow-up    Sheila Vincent has a history of the following: Patient Active Problem List   Diagnosis Date Noted   Peripheral edema 04/20/2024   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/25/2022   Nasal vestibulitis 12/08/2022   Seasonal allergic rhinitis due to pollen 12/08/2022   Chronic rhinitis 11/27/2022   Deviated nasal septum 11/27/2022   Recurrent infections 09/22/2022   Acute bacterial sinusitis 09/08/2022   At increased risk of exposure to COVID-19 virus 09/08/2022   Gait abnormality 06/16/2022   Right hip pain 06/16/2022   Numbness of right foot 06/16/2022   Cerebral vascular disease 06/16/2022   Crohn's disease of large bowel (HCC) 06/05/2022   Diarrhea 06/05/2022   Rectal bleeding 06/05/2022   IBD (inflammatory bowel disease) 08/08/2021   Symptomatic anemia 08/08/2021   Iron  deficiency anemia due to  chronic blood loss 08/08/2021   Laryngopharyngeal reflux (LPR) 09/09/2019   Sudden right hearing loss 09/09/2019   Tinnitus of right ear 09/09/2019   Eustachian tube dysfunction, right 09/05/2019   Bug bite 04/21/2019   Heart murmur 04/21/2019   De Quervain's syndrome (tenosynovitis) 02/23/2019   Primary osteoarthritis of first carpometacarpal joint of  left hand 10/11/2018   GERD (gastroesophageal reflux disease) 04/06/2018   Cough, persistent 04/06/2018   Hypertension 03/30/2018   Seasonal and perennial allergic rhinitis 07/14/2015   Moderate persistent asthma 07/14/2015   Upper airway cough syndrome 12/06/2014   Right foot pain 07/06/2014   Low back pain 07/06/2014    History obtained from: chart review and patient.  Care team PCP: Sim Emery CROME, MD Orthopedic surgeon: Dr. Lynwood Better Cardiology: Dr. Madonna Large Physical Therapist: Alm Kingdom Gastroenterologist: Dr. Suzen Brass Hematologist: Dr. Norleen Kidney  Takes family elevated Urologist: Dr. Ricardo Likens  Discussed the use of AI scribe software for clinical note transcription with the patient and/or guardian, who gave verbal consent to proceed.  Sheila Vincent is a 63 y.o. female presenting for a follow up visit.  She was last seen in February 2025.  At that time, we did not do spirometry.  We continue with Singulair  10 mg daily as well as albuterol  as needed.  She has Symbicort  that she had during flares.  For her rhinitis, we recommended using Zymase.  We gave her a sample of this.  We also talked about trying Sinus Pglumber.  We continue with Astelin  as well as Flonase .  She also continued with allergy  shots.  Since last visit, she has done very well from an allergic rhinitis standpoint.  She experienced a severe reaction to an iron  infusion received on July 14, 2024. This was her second infusion of a new type of iron , following a previous infusion in 2022 that she tolerated well. During the infusion, she developed chest tightness, difficulty breathing, cold sweats, and severe back pain within five to seven minutes of starting the infusion. Medical staff intervened with medications to flush the IV, and an ambulance was called. She was taken to the hospital where an EKG and troponin levels were normal.  She has a history of iron  deficiency anemia secondary to  Crohn's disease, which necessitates iron  infusions. She previously tolerated Venofer  (iron  sucrose) well in 2022, but the recent infusion was of a different type, possibly Ferraheme. She is unsure why the type was changed and expressed concern about the preparation of the infusion, noting it was not ready upon her arrival.  She has tolerated IV infusions of iron  in the past.  We reviewed her Epic chart and she tolerated a product called Venofer  several years ago - on multiple occasions.  So she is going to talk to her hematologist about using this product instead.  She has Crohn's disease and diabetes. She reported that her hemoglobin A1c was 8.9 six months ago, and that her doctor was happy with her recent blood work. She is working on dietary modifications independently.  Asthma/Respiratory Symptom History: Her breathing is stable, and she has not needed to use her inhaler recently.  Her breathing test looks excellent today.  She does remain on the Singulair  10 mg daily which is likely controlling her breathing.  She has Symbicort  to be used 2 puffs twice daily for 1 to 2 weeks during flares.  Allergic Rhinitis Symptom History: She has a history of seasonal allergies, which are currently well-managed with monthly allergy  shots and  daily cetirizine  and montelukast . She experiences occasional sneezing fits but has not used nasal sprays for some time.  She does feel like that 1 lesion in her nose is coming back.  She had previously seen ENT and it has resolved for a period of time.  But overall, her episodes of sneezing have been worse for the last frequent, around once per month.  This is definitely better than it has been in the past.  [place]inah is on allergen immunotherapy. She receives two injections. Immunotherapy script #1 contains trees, weeds, grasses, dust mites, cat, and dog. She currently receives 0.50mL of the RED vial (1/100). Immunotherapy script #2 contains  ragweed and molds. She currently  receives 0.50mL of the RED vial (1/100). She started shots November of 2024 and reached maintenance in January 2025.  Skin Symptom History: The skin on her hands is much better.  It continues to be well, but is not feeling scaly like it was last summer far.  Tends to be worse in the winter months.  He has not been on prednisone  for her skin issues.  Infection Symptom History: She has never left suggest infection.  She feels like the frequency of infections especially her sinus infection has decreased.   Otherwise, there have been no changes to her past medical history, surgical history, family history, or social history.    Review of systems otherwise negative other than that mentioned in the HPI.    Objective:   Blood pressure 124/82, pulse 82, temperature (!) 97.4 F (36.3 C), temperature source Temporal, resp. rate 18, height 5' 1.81 (1.57 m), weight 148 lb 4.8 oz (67.3 kg), SpO2 100%. Body mass index is 27.29 kg/m.    Physical Exam Vitals reviewed.  Constitutional:      Appearance: She is well-developed.     Comments: Very talkative as always. Smiling. Loud.   HENT:     Head: Normocephalic and atraumatic.     Right Ear: Tympanic membrane, ear canal and external ear normal.     Left Ear: Tympanic membrane, ear canal and external ear normal.     Nose: No nasal deformity, septal deviation, mucosal edema or rhinorrhea.     Right Turbinates: Enlarged, swollen and pale.     Left Turbinates: Enlarged, swollen and pale.     Right Sinus: No maxillary sinus tenderness or frontal sinus tenderness.     Left Sinus: No maxillary sinus tenderness or frontal sinus tenderness.     Comments: No polyps. Rhinorrhea present. Seem less intense than previous visits.     Mouth/Throat:     Mouth: Mucous membranes are not pale and not dry.     Pharynx: Uvula midline.  Eyes:     General: Lids are normal. No allergic shiner.       Right eye: No discharge.        Left eye: No discharge.      Conjunctiva/sclera: Conjunctivae normal.     Right eye: Right conjunctiva is not injected. No chemosis.    Left eye: Left conjunctiva is not injected. No chemosis.    Pupils: Pupils are equal, round, and reactive to light.  Cardiovascular:     Rate and Rhythm: Normal rate and regular rhythm.     Heart sounds: Normal heart sounds.  Pulmonary:     Effort: Pulmonary effort is normal. No tachypnea, accessory muscle usage or respiratory distress.     Breath sounds: Normal breath sounds. No wheezing, rhonchi or rales.     Comments: Moving  air well in all lung fields. No increased work of breathing noted.  Chest:     Chest wall: No tenderness.  Lymphadenopathy:     Cervical: No cervical adenopathy.  Skin:    General: Skin is warm.     Capillary Refill: Capillary refill takes less than 2 seconds.     Coloration: Skin is not pale.     Findings: No abrasion, erythema, petechiae or rash. Rash is not papular, urticarial or vesicular.     Comments: She has dry cracked fingers with fissures on some of her fingers.   Neurological:     Mental Status: She is alert.  Psychiatric:        Behavior: Behavior is cooperative.      Diagnostic studies:    Spirometry: results normal (FEV1: 2.18/93%, FVC: 2.96/98%, FEV1/FVC: 74%).    Spirometry consistent with normal pattern.   Allergy  Studies: labs sent instead       Marty Shaggy, MD  [facility] 

## 2024-07-22 ENCOUNTER — Ambulatory Visit: Payer: Self-pay | Admitting: *Deleted

## 2024-07-22 LAB — HEMOGLOBIN A1C
Est. average glucose Bld gHb Est-mCnc: 143 mg/dL
Hgb A1c MFr Bld: 6.6 % — ABNORMAL HIGH (ref 4.8–5.6)

## 2024-07-22 NOTE — Telephone Encounter (Signed)
 TCT patient regarding recent lab results. No answer and no vm available. Also she does not have My Chart set up. Will call patient again next week

## 2024-07-22 NOTE — Telephone Encounter (Signed)
-----   Message from Norleen ONEIDA Kidney IV sent at 07/21/2024  9:54 AM EDT ----- Please let Sheila Vincent know her Hgb and iron  levels have improved considerably. I am sorry she had a reaction to the last iron  infusion. She has a visit with Cassie scheduled next week. If she would  prefer to wait to discuss with me we can certainly schedule at a later time (please let her know I am on paternity leave). Otherwise she'll have a visit next week.  ----- Message ----- From: Rebecka, [facility] Sent: 07/20/2024  12:48 PM EDT To: Norleen ONEIDA Kidney [place], MD

## 2024-07-23 LAB — TRYPTASE: Tryptase: 7.6 ug/L (ref 2.2–13.2)

## 2024-07-25 ENCOUNTER — Telehealth: Payer: Self-pay | Admitting: *Deleted

## 2024-07-25 NOTE — Telephone Encounter (Signed)
 Received vm message from pt regarding a telephone visit she thought was scheduled for today. Attempted call back. No answer and no vm available. Pt does not have MyChart set up Pt's phone visit is actually scheduled on 07/29/24 @ 10 am.   Will try again later in the day.
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
## 2024-07-29 ENCOUNTER — Ambulatory Visit: Payer: Self-pay | Admitting: Allergy & Immunology

## 2024-07-29 ENCOUNTER — Inpatient Hospital Stay (HOSPITAL_BASED_OUTPATIENT_CLINIC_OR_DEPARTMENT_OTHER): Admitting: Physician Assistant

## 2024-07-29 DIAGNOSIS — D500 Iron deficiency anemia secondary to blood loss (chronic): Secondary | ICD-10-CM | POA: Diagnosis not present

## 2024-08-01 ENCOUNTER — Telehealth: Payer: Self-pay | Admitting: Hematology and Oncology

## 2024-08-01 NOTE — Telephone Encounter (Signed)
 Scheduled appointments per 07/29/24 los notes with patient and mailed appointment reminders.

## 2024-08-04 ENCOUNTER — Telehealth: Payer: Self-pay

## 2024-08-04 NOTE — Telephone Encounter (Signed)
 Dr. Iva,  I called pt to advise her of lab results- while discussing she said she is having sinusitis symptoms, approx started 9/19 with sneezing and ha sgotten worse since, today she is having nasal congestion and her voice is hoarse, no fever or any other symptoms, she is requesting abx for Amoxi-Clav, I advised MD visit may be required but I would check with you to see if visit is needed or if you are willing to send abx. Please advise. Thanks so much! -AC,CMA

## 2024-08-05 NOTE — Telephone Encounter (Signed)
 I called the patient to schedule. The phone line just rang and rang and rang. I was unable to reach the patient.

## 2024-08-05 NOTE — Telephone Encounter (Signed)
 I called and the line kept ringing but no answer and not able to leave voicemail. Please have patient make an appointment to discuss treatment for her concerns.

## 2024-08-05 NOTE — Telephone Encounter (Signed)
 Agree with you - she needs to come in and be seen. Thank you, Rosina!

## 2024-08-08 NOTE — Telephone Encounter (Signed)
 I spoke with the patient and she was unaware of why we were trying to contact her. I explained that back on 08/04/24 we gave her lab results and she wanted to complain about having a bacterial infection. I offered her appointments with any of the providers due to this is a new complaint. She went to state that this was not new, she has dealt with it for years and usually does not need an appointment to get antibiotics. I told patient that this might have been the case then but we have a policy that when requesting treatment for a new complaint that she would need an appointment. Per provider she needed an appointment. She informed me that she was going to contact her primary care physician.

## 2024-08-08 NOTE — Telephone Encounter (Signed)
 I was unable to reach patient. Line kept ringing and had no voicemail set up.

## 2024-08-10 ENCOUNTER — Ambulatory Visit

## 2024-08-10 DIAGNOSIS — J309 Allergic rhinitis, unspecified: Secondary | ICD-10-CM | POA: Diagnosis not present

## 2024-08-15 ENCOUNTER — Other Ambulatory Visit: Payer: Self-pay

## 2024-08-15 ENCOUNTER — Encounter (HOSPITAL_COMMUNITY): Payer: Self-pay | Admitting: *Deleted

## 2024-08-15 ENCOUNTER — Ambulatory Visit (HOSPITAL_COMMUNITY)
Admission: EM | Admit: 2024-08-15 | Discharge: 2024-08-15 | Disposition: A | Discharge status: Home / Self Care | Source: Home / Self Care

## 2024-08-15 ENCOUNTER — Ambulatory Visit (INDEPENDENT_AMBULATORY_CARE_PROVIDER_SITE_OTHER)

## 2024-08-15 ENCOUNTER — Emergency Department (HOSPITAL_COMMUNITY)
Admission: EM | Admit: 2024-08-15 | Discharge: 2024-08-15 | Discharge status: Against Medical Advice | Attending: Physician Assistant | Admitting: Physician Assistant

## 2024-08-15 ENCOUNTER — Ambulatory Visit (HOSPITAL_COMMUNITY): Payer: Self-pay | Admitting: Physician Assistant

## 2024-08-15 ENCOUNTER — Encounter (HOSPITAL_COMMUNITY): Payer: Self-pay

## 2024-08-15 DIAGNOSIS — R0789 Other chest pain: Secondary | ICD-10-CM

## 2024-08-15 DIAGNOSIS — R0602 Shortness of breath: Secondary | ICD-10-CM

## 2024-08-15 DIAGNOSIS — R7989 Other specified abnormal findings of blood chemistry: Secondary | ICD-10-CM

## 2024-08-15 DIAGNOSIS — Z5321 Procedure and treatment not carried out due to patient leaving prior to being seen by health care provider: Secondary | ICD-10-CM | POA: Insufficient documentation

## 2024-08-15 DIAGNOSIS — R0781 Pleurodynia: Secondary | ICD-10-CM

## 2024-08-15 LAB — COMPREHENSIVE METABOLIC PANEL WITH GFR
ALT: 28 U/L (ref 0–44)
ALT: 27 U/L (ref 0–44)
AST: 22 U/L (ref 15–41)
AST: 22 U/L (ref 15–41)
Albumin: 4 g/dL (ref 3.5–5.0)
Albumin: 4 g/dL (ref 3.5–5.0)
Alkaline Phosphatase: 108 U/L (ref 38–126)
Alkaline Phosphatase: 98 U/L (ref 38–126)
Anion gap: 11 (ref 5–15)
Anion gap: 10 (ref 5–15)
BUN: 9 mg/dL (ref 8–23)
BUN: 10 mg/dL (ref 8–23)
CO2: 24 mmol/L (ref 22–32)
CO2: 23 mmol/L (ref 22–32)
Calcium: 9.6 mg/dL (ref 8.9–10.3)
Calcium: 9.5 mg/dL (ref 8.9–10.3)
Chloride: 105 mmol/L (ref 98–111)
Chloride: 104 mmol/L (ref 98–111)
Creatinine, Ser: 0.76 mg/dL (ref 0.44–1.00)
Creatinine, Ser: 0.76 mg/dL (ref 0.44–1.00)
GFR, Estimated: >60 mL/min (ref >60)
GFR, Estimated: >60 mL/min (ref >60)
Glucose, Bld: 167 mg/dL — ABNORMAL HIGH (ref 70–99)
Glucose, Bld: 159 mg/dL — ABNORMAL HIGH (ref 70–99)
Potassium: 4.2 mmol/L (ref 3.5–5.1)
Potassium: 3.7 mmol/L (ref 3.5–5.1)
Sodium: 140 mmol/L (ref 135–145)
Sodium: 137 mmol/L (ref 135–145)
Total Bilirubin: 0.4 mg/dL (ref 0.0–1.2)
Total Bilirubin: 0.2 mg/dL (ref 0.0–1.2)
Total Protein: 7.3 g/dL (ref 6.5–8.1)
Total Protein: 7 g/dL (ref 6.5–8.1)

## 2024-08-15 LAB — CBC WITH DIFFERENTIAL/PLATELET
Abs Immature Granulocytes: 0.04 K/uL (ref 0.00–0.07)
Abs Immature Granulocytes: 0.05 K/uL (ref 0.00–0.07)
Basophils Absolute: 0.1 K/uL (ref 0.0–0.1)
Basophils Absolute: 0.1 K/uL (ref 0.0–0.1)
Basophils Relative: 1 %
Basophils Relative: 1 %
Eosinophils Absolute: 0.1 K/uL (ref 0.0–0.5)
Eosinophils Absolute: 0.1 K/uL (ref 0.0–0.5)
Eosinophils Relative: 1 %
Eosinophils Relative: 1 %
HCT: 39.8 % (ref 36.0–46.0)
HCT: 38.8 % (ref 36.0–46.0)
Hemoglobin: 12.9 g/dL (ref 12.0–15.0)
Hemoglobin: 12.6 g/dL (ref 12.0–15.0)
Immature Granulocytes: 0 %
Immature Granulocytes: 1 %
Lymphocytes Relative: 20 %
Lymphocytes Relative: 20 %
Lymphs Abs: 2.2 K/uL (ref 0.7–4.0)
Lymphs Abs: 2.1 K/uL (ref 0.7–4.0)
MCH: 25.9 pg — ABNORMAL LOW (ref 26.0–34.0)
MCH: 26.2 pg (ref 26.0–34.0)
MCHC: 32.4 g/dL (ref 30.0–36.0)
MCHC: 32.5 g/dL (ref 30.0–36.0)
MCV: 79.9 fL — ABNORMAL LOW (ref 80.0–100.0)
MCV: 80.7 fL (ref 80.0–100.0)
Monocytes Absolute: 0.8 K/uL (ref 0.1–1.0)
Monocytes Absolute: 0.8 K/uL (ref 0.1–1.0)
Monocytes Relative: 7 %
Monocytes Relative: 8 %
Neutro Abs: 7.8 K/uL — ABNORMAL HIGH (ref 1.7–7.7)
Neutro Abs: 7.4 K/uL (ref 1.7–7.7)
Neutrophils Relative %: 71 %
Neutrophils Relative %: 69 %
Platelets: 367 K/uL (ref 150–400)
Platelets: 362 K/uL (ref 150–400)
RBC: 4.98 MIL/uL (ref 3.87–5.11)
RBC: 4.81 MIL/uL (ref 3.87–5.11)
RDW: 17.6 % — ABNORMAL HIGH (ref 11.5–15.5)
RDW: 17.4 % — ABNORMAL HIGH (ref 11.5–15.5)
WBC: 10.9 K/uL — ABNORMAL HIGH (ref 4.0–10.5)
WBC: 10.6 K/uL — ABNORMAL HIGH (ref 4.0–10.5)
nRBC: 0 % (ref 0.0–0.2)
nRBC: 0 % (ref 0.0–0.2)

## 2024-08-15 LAB — LIPASE, BLOOD: Lipase: 34 U/L (ref 11–51)

## 2024-08-15 LAB — TROPONIN I (HIGH SENSITIVITY): Troponin I (High Sensitivity): 4 ng/L (ref <18)

## 2024-08-15 LAB — D-DIMER, QUANTITATIVE: D-Dimer, Quant: 0.54 ug{FEU}/mL — ABNORMAL HIGH (ref 0.00–0.50)

## 2024-08-15 NOTE — ED Triage Notes (Signed)
 PT sent from [facility] for 6/10 L. side CP. No N/V.

## 2024-08-15 NOTE — ED Triage Notes (Signed)
 PT reports CP since last night . PT reports CP worse when deep breathing.
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
## 2024-08-15 NOTE — ED Notes (Signed)
 Pt called for vital recheck. Pt did not respond from lobby.

## 2024-08-15 NOTE — ED Triage Notes (Signed)
 Pt c/o left sided chest pain that started last night. Pt seen at [facility] for same and referred to ED for + d-dimer. Pt has shortness of breath.

## 2024-08-15 NOTE — Discharge Instructions (Signed)
 Please go directly to the emergency room for further evaluation and management.

## 2024-08-15 NOTE — ED Provider Triage Note (Signed)
 Emergency Medicine Provider Triage Evaluation Note  Sheila Vincent , a 63 y.o. female  was evaluated in triage.  Pt complains of sided chest wall pain radiating across to the right side of the chest.  This started this morning.  She went to urgent care and had x-ray.  Also had a D-dimer.  D-dimer was 0.54 which is normal when using age-adjusted D-dimer for VTE.  Review of Systems  Positive: CP Negative: SOB, Unilateral calf tenderness or swelling.   Physical Exam  BP 113/71 (BP Location: Left Arm)   Pulse 82   Temp 97.6 F (36.4 C)   Resp 17   Ht 5' 4 (1.626 m)   Wt 67.1 kg   SpO2 99%   BMI 25.40 kg/m  Gen:   Awake, no distress   Resp:  Normal effort  MSK:   Moves extremities without difficulty  Other:    Medical Decision Making  Medically screening exam initiated at 2:08 PM.  Appropriate orders placed.  Sheila Vincent was informed that the remainder of the evaluation will be completed by another provider, this initial triage assessment does not replace that evaluation, and the importance of remaining in the ED until their evaluation is complete.     Shermon Warren SAILOR, PA-C 08/15/24 1415

## 2024-08-16 ENCOUNTER — Other Ambulatory Visit: Payer: Self-pay

## 2024-08-16 ENCOUNTER — Emergency Department (HOSPITAL_COMMUNITY)
Admission: EM | Admit: 2024-08-16 | Discharge: 2024-08-16 | Disposition: A | Discharge status: Home / Self Care | Attending: Emergency Medicine | Admitting: Emergency Medicine

## 2024-08-16 ENCOUNTER — Encounter (HOSPITAL_COMMUNITY): Payer: Self-pay

## 2024-08-16 ENCOUNTER — Emergency Department (HOSPITAL_COMMUNITY)

## 2024-08-16 DIAGNOSIS — J45909 Unspecified asthma, uncomplicated: Secondary | ICD-10-CM | POA: Insufficient documentation

## 2024-08-16 DIAGNOSIS — I10 Essential (primary) hypertension: Secondary | ICD-10-CM | POA: Diagnosis not present

## 2024-08-16 DIAGNOSIS — R0789 Other chest pain: Secondary | ICD-10-CM | POA: Insufficient documentation

## 2024-08-16 DIAGNOSIS — Z79899 Other long term (current) drug therapy: Secondary | ICD-10-CM | POA: Diagnosis not present

## 2024-08-16 LAB — TROPONIN T, HIGH SENSITIVITY
Troponin T High Sensitivity: <15 ng/L (ref 0–19)
Troponin T High Sensitivity: <15 ng/L (ref 0–19)

## 2024-08-16 LAB — D-DIMER, QUANTITATIVE: D-Dimer, Quant: 0.57 ug{FEU}/mL — ABNORMAL HIGH (ref 0.00–0.50)

## 2024-08-16 LAB — BASIC METABOLIC PANEL WITH GFR
Anion gap: 14 (ref 5–15)
BUN: 11 mg/dL (ref 8–23)
CO2: 24 mmol/L (ref 22–32)
Calcium: 10.2 mg/dL (ref 8.9–10.3)
Chloride: 103 mmol/L (ref 98–111)
Creatinine, Ser: 0.8 mg/dL (ref 0.44–1.00)
GFR, Estimated: >60 mL/min (ref >60)
Glucose, Bld: 150 mg/dL — ABNORMAL HIGH (ref 70–99)
Potassium: 4 mmol/L (ref 3.5–5.1)
Sodium: 141 mmol/L (ref 135–145)

## 2024-08-16 LAB — CBC
HCT: 44.6 % (ref 36.0–46.0)
Hemoglobin: 13.6 g/dL (ref 12.0–15.0)
MCH: 25.1 pg — ABNORMAL LOW (ref 26.0–34.0)
MCHC: 30.5 g/dL (ref 30.0–36.0)
MCV: 82.3 fL (ref 80.0–100.0)
Platelets: 395 K/uL (ref 150–400)
RBC: 5.42 MIL/uL — ABNORMAL HIGH (ref 3.87–5.11)
RDW: 17.5 % — ABNORMAL HIGH (ref 11.5–15.5)
WBC: 10.6 K/uL — ABNORMAL HIGH (ref 4.0–10.5)
nRBC: 0 % (ref 0.0–0.2)

## 2024-08-16 MED ORDER — IBUPROFEN 600 MG PO TABS: 600.0000 mg | ORAL_TABLET | Freq: Three times a day (TID) | ORAL | 0 refills | Status: AC | PRN

## 2024-08-16 MED ORDER — KETOROLAC TROMETHAMINE 30 MG/ML IJ SOLN
30.0000 mg | Freq: Once | INTRAMUSCULAR | Status: AC
  Administered 2024-08-16: 30 mg via INTRAVENOUS
  Filled 2024-08-16: qty 1

## 2024-08-16 MED ORDER — DEXAMETHASONE SOD PHOSPHATE PF 10 MG/ML IJ SOLN
6.0000 mg | Freq: Once | INTRAMUSCULAR | Status: AC
  Administered 2024-08-16: 6 mg via INTRAVENOUS

## 2024-08-16 NOTE — ED Triage Notes (Signed)
 Pt reports with left/center chest pain and shob since Sunday. Pt describes it as sharp and stabbing when she takes a breath.

## 2024-08-16 NOTE — ED Provider Notes (Signed)
 [facility] AT [facility] Provider Note   CSN: [id number] Arrival date & time: 08/16/24  1541     Patient presents with: Chest Pain   Sheila Vincent is a 63 y.o. female with past medical history of asthma, HTN, GERD, heart murmur, IDA, Crohn's presents to Emergency Department for evaluation of chest pain.  Pain is located in left and center chest that started in the left rib cage and radiated to middle of chest.  Pain is described as sharp.  Pain worsens with bending, twisting, palpation, deep inspiration.  Does not worsen with exertion. No radiation into back. Pain started 2 days ago early in the morning then completely resolved then came back within the next couple of hours. Has not tried any OTC medications prior to arrival.  Denies cough, fevers, pedal edema, shortness of breath, hx of DVT/PE, recent surgeries, recent travel.  Was initially assessed at [facility] who recommended ED evaluation for elevated non age adjusted dimer.    Chest Pain      Prior to Admission medications   Medication Sig Start Date End Date Taking? Authorizing Provider  ibuprofen (ADVIL) 600 MG tablet Take 1 tablet (600 mg total) by mouth every 8 (eight) hours as needed for up to 21 doses. 08/16/24  Yes Cottie Donnice PARAS, MD  Accu-Chek Softclix Lancets lancets every morning. 09/18/23   [provider]  albuterol  (VENTOLIN  HFA) 108 (90 Base) MCG/ACT inhaler INHALE 2 PUFFS INTO THE LUNGS EVERY 6 HOURS AS NEEDED FOR WHEEZING OR SHORTNESS OF BREATH 03/23/23   Iva Marty Saltness, MD  amLODipine  (NORVASC ) 5 MG tablet Take 5 mg by mouth daily.    [provider]  atorvastatin  (LIPITOR) 40 MG tablet Take 40 mg by mouth at bedtime.    [provider]  azelastine  (ASTELIN ) 0.1 % nasal spray PLACE 2 SPRAY IN EACH NOSTRIL TWICE DAILY AS DIRECTED 11/10/23   Iva Marty Saltness, MD  Blood Glucose Monitoring Suppl (ACCU-CHEK GUIDE) w/Device KIT USE TO CHECK BLOOD SUGAR DAILY  07/17/22   [provider]  cetirizine  (ZYRTEC ) 10 MG tablet Take 1 tablet (10 mg total) by mouth in the morning and at bedtime. Patient not taking: Reported on 07/21/2024 01/19/24 07/11/24  Iva Marty Saltness, MD  fenofibrate  (TRICOR ) 145 MG tablet Take 1 tablet (145 mg total) by mouth daily. 06/23/24   Tolia, Sunit, DO  glucose blood test strip TEST BLOOD SUGAR EVERY MORNING BEFORE BREAKFAST 06/03/23   [provider]  levocetirizine (XYZAL ) 5 MG tablet Take 1 tablet (5 mg total) by mouth every evening. Patient not taking: Reported on 07/21/2024 11/10/23 07/11/24  Iva Marty Saltness, MD  mesalamine  (LIALDA ) 1.2 g EC tablet Take 4 tablets (4.8 g total) by mouth daily with breakfast. Please schedule a follow up visit for additional refills. Thank you 03/29/24   Kennedy-Smith, Colleen M, NP  metFORMIN  (GLUCOPHAGE ) 500 MG tablet Take 500 mg by mouth 2 (two) times daily. 07/17/22   [provider]  montelukast  (SINGULAIR ) 10 MG tablet Take 1 tablet (10 mg total) by mouth at bedtime. 01/19/24   Iva Marty Saltness, MD  olmesartan (BENICAR) 20 MG tablet Take 20 mg by mouth daily.    [provider]  omeprazole  (PRILOSEC) 40 MG capsule TAKE 1 CAPSULE(40 MG) BY MOUTH DAILY 07/07/24   Iva Marty Saltness, MD  pregabalin  (LYRICA ) 75 MG capsule Take 1 capsule (75 mg total) by mouth 2 (two) times daily. Patient not taking: Reported on 07/21/2024 02/17/24 07/11/24  Georgina Ozell LABOR, MD  triamcinolone  ointment (KENALOG ) 0.5 % Apply 2-3 times daily to your fingers to see if this helps with wound healing. 01/19/24   Iva Marty Saltness, MD    Allergies: Feraheme [ferumoxytol ], Sulfa antibiotics, Sulfasalazine , Trimethoprim, and Bactrim    Review of Systems  Cardiovascular:  Positive for chest pain.    Updated Vital Signs BP 114/66   Pulse 79   Temp 98.2 F (36.8 C) (Oral)   Resp (!) 24   SpO2 98%   Physical Exam Vitals and nursing note reviewed.  Constitutional:       General: She is not in acute distress.    Appearance: Normal appearance.  HENT:     Head: Normocephalic and atraumatic.  Eyes:     Conjunctiva/sclera: Conjunctivae normal.  Cardiovascular:     Rate and Rhythm: Normal rate.     Heart sounds: Normal heart sounds. No murmur heard. Pulmonary:     Effort: Pulmonary effort is normal. No respiratory distress.     Breath sounds: Normal breath sounds.     Comments: Speaking full complete sentences without difficulty.  No tachypnea nor hypoxia.  Maintaining oxygen saturation without supplementation Chest:     Chest wall: Tenderness present.  Musculoskeletal:     Right lower leg: No edema.     Left lower leg: No edema.  Skin:    Coloration: Skin is not jaundiced or pale.  Neurological:     Mental Status: She is alert. Mental status is at baseline.     (all labs ordered are listed, but only abnormal results are displayed) Labs Reviewed  BASIC METABOLIC PANEL WITH GFR - Abnormal; Notable for the following components:      Result Value   Glucose, Bld 150 (*)    All other components within normal limits  CBC - Abnormal; Notable for the following components:   WBC 10.6 (*)    RBC 5.42 (*)    MCH 25.1 (*)    RDW 17.5 (*)    All other components within normal limits  D-DIMER, QUANTITATIVE - Abnormal; Notable for the following components:   D-Dimer, Quant 0.57 (*)    All other components within normal limits  TROPONIN T, HIGH SENSITIVITY  TROPONIN T, HIGH SENSITIVITY    EKG: EKG Interpretation Date/Time:  Tuesday August 16 2024 15:50:06 EDT Ventricular Rate:  94 PR Interval:  138 QRS Duration:  84 QT Interval:  351 QTC Calculation: 439 R Axis:   40  Text Interpretation: Sinus rhythm Baseline wander in lead(s) V1 V2 V6 Confirmed by Cottie Cough [phone]) on 08/16/2024 5:47:59 PM  Radiology: [facility] Chest 2 View Result Date: 08/16/2024 CLINICAL DATA:  Chest pain EXAM: CHEST - 2 VIEW COMPARISON:  08/15/2024 FINDINGS: Normal heart  size and vascularity. Similar left midlung minor scarring. No acute pneumonia, collapse or consolidation. Negative for edema, effusion, or pneumothorax. Trachea midline. Minor degenerative changes of the thoracolumbar junction on the lateral view. IMPRESSION: No active chest disease. Electronically Signed   By: CHRISTELLA.  Shick M.D.   On: 08/16/2024 16:15   DG Ribs Unilateral W/Chest Left Result Date: 08/15/2024 CLINICAL DATA:  Chest pain. EXAM: LEFT RIBS AND CHEST - 3+ VIEW COMPARISON:  05/27/2021. FINDINGS: No acute osseous abnormality is identified. No obvious displaced rib fracture. Degenerative changes of the bilateral shoulders. There is no evidence of pneumothorax or pleural effusion. Minimal linear scarring in the left mid lung zone. Both lungs are otherwise clear. Heart size and mediastinal contours are within normal limits.  IMPRESSION: 1. No acute osseous abnormality is identified. No obvious displaced rib fracture. 2. Minimal linear scarring in the left mid lung zone. Otherwise, no acute cardiopulmonary findings. Electronically Signed   By: Harrietta Sherry M.D.   On: 08/15/2024 12:55     Medications Ordered in the ED  dexamethasone  (DECADRON ) injection 6 mg (has no administration in time range)  ketorolac  (TORADOL ) 30 MG/ML injection 30 mg (has no administration in time range)    Clinical Course as of 08/16/24 2001  Tue Aug 16, 2024  1959 50 female presented to ED with 2 days of intermittent left-sided chest wall pain.  Patient reports this was a stabbing sensation under her left breast in the anterior side of her chest, worse with inspiration and palpation and movement.  The pain went away completely yesterday morning, and then returned again later in the afternoon.  Denies radiculopathy into her arms or legs, denies falls or trauma.  She went to urgent care and then was referred into ED for further evaluation.  Vital signs have been unremarkable here in the ED.  Cardiopulmonary exam also  unremarkable.  Patient does have some mild tenderness along the anterior chest wall; no abdominal tenderness.  Blood tests are reassuring with negative undetectable troponins, nonischemic EKG per my interpretation, no emergent findings on chest x-ray.  Her D-dimer is age-adjusted to be normal.  I have a low suspicion for acute PE.  I suspect this is costochondritis or chest wall pain, and we can try some anti-inflammatories with a shot of Decadron  and Toradol .  I recommended she follow-up with her PCP for this issue.  Have a lower suspicion for aortic dissection, PE, pneumothorax, or other life-threatening condition [MT]    Clinical Course User Index [MT] Trifan, Donnice PARAS, MD                                 Medical Decision Making Amount and/or Complexity of Data Reviewed Labs: ordered. Radiology: ordered.  Risk Prescription drug management.   Patient presents to the ED for concern of chest pain, this involves an extensive number of treatment options, and is a complaint that carries with it a high risk of complications and morbidity.  The differential diagnosis includes ACS, PE, fluid overload, pneumonia, MSK, dissection   Co morbidities that complicate the patient evaluation  See HPI   Additional history obtained:  Additional history obtained from Nursing and Outside Medical Records   External records from outside source obtained and reviewed including triage RN note   Lab Tests:  I Ordered, and personally interpreted labs.  The pertinent results include:   Dimer 0.57 (Age-adjusted dimer <0.65) WBC 10.6 Troponin delta troponin negative CBG 150   Imaging Studies ordered:  I ordered imaging studies including chest x-ray I independently visualized and interpreted imaging which showed no acute cardiopulmonary pathology I agree with the radiologist interpretation   Cardiac Monitoring:  The patient was maintained on a cardiac monitor.  I personally viewed and interpreted  the cardiac monitored which showed an underlying rhythm of: NSR at 94 bpm with no ST no T wave abnormalities   Medicines ordered and prescription drug management:  Medication ordered including decadron , toradol , advil  for pain  Reevaluation of the patient after these medicines showed that the patient improved I have reviewed the patients home medicines and have made adjustments as needed     Problem List / ED Course:  CP  Last saw cardiology 05/26/2024 with Dr. Madonna Large.  He is being followed for management of HLD, hypertriglyceridemia which is currently being treated with statin therapy Last echo 2023 with normal LVEF Stress test 2023 low risk Carotid artery duplex 2023 shows minimal stenosis and right ICA, left ICA and diffuse mild homogenous plaque in bilateral carotid arteries Recommended annual follow-up Nonexertional. Reproducible with deep inspiration, palpitation, bending, twisting. Troponin and delta troponin negative. Low suspicion for ACS Low suspicion for dissection with no radiation of pain into back.  No neurological deficits.  No hypertension CXR wo PNA, fluid Does not appear fluid overloaded with no pedal edema. VS with no hypoxia nor tachycardia. No pedal edema, recent travel, recent surgeries, hx of PE/DVT.  Age-adjusted dimer 0.650 so patient's dimer is 0.57 and is WNL. Had patient ambulate to restroom with no complaints of SHOB nor hypoxia. Low suspicion of PE Pain likely MSK/costochondritic in nature as it is reproducible with palpation, inspiration No hypoxia nor tachycardia while in ED Will provide decadron , ibuprofen for pain management. Pt will f/u with PCP   Reevaluation:  After the interventions noted above, I reevaluated the patient and found that they have :improved   Dispostion:  After consideration of the diagnostic results and the patients response to treatment, I feel that the patent would benefit from patient management with PCP follow-up as  needed.   Discussed ED workup, disposition, return to ED precautions with patient who expresses understanding agrees with plan.  All questions answered to their satisfaction.  They are agreeable to plan.  Discharge instructions provided on paperwork  Dr. Cottie individually assessed pt, reviewed ED workup and agrees with plan   Final diagnoses:  Chest wall pain    ED Discharge Orders          Ordered    ibuprofen (ADVIL) 600 MG tablet  Every 8 hours PRN        08/16/24 1953              Minnie Tinnie BRAVO, PA 08/16/24 2044    Cottie Donnice PARAS, MD 08/16/24 2355

## 2024-08-18 ENCOUNTER — Other Ambulatory Visit: Payer: Self-pay | Admitting: Allergy & Immunology

## 2024-08-22 ENCOUNTER — Encounter: Payer: Self-pay | Admitting: Dietician

## 2024-08-22 ENCOUNTER — Encounter: Attending: "Endocrinology | Admitting: Dietician

## 2024-08-22 ENCOUNTER — Ambulatory Visit: Admitting: Dietician

## 2024-08-22 VITALS — Ht 64.0 in | Wt 150.0 lb

## 2024-08-22 DIAGNOSIS — E119 Type 2 diabetes mellitus without complications: Secondary | ICD-10-CM | POA: Insufficient documentation

## 2024-08-22 NOTE — Patient Instructions (Addendum)
 Drink mostly water.  Avoid sweetened drinks. Continue to stay active. Reduce milk to 2% Avoid fried food

## 2024-08-22 NOTE — Progress Notes (Unsigned)
 Diabetes Self-Management Education  Visit Type: First/Initial  Appt. Start Time: 1410 Appt. End Time: 1515  08/23/2024  [name], identified by name and date of birth, is a 63 y.o. female with a diagnosis of Diabetes: Type 2.   ASSESSMENT  Patient is here today alone.  Referral:  Type 2 Diabetes History includes:  HTN,  Type 2 diabetes, CKD, anxiety/depression, GERD, blood clots, chron's, HLD Medications include:  Metformin , allergy  shots, iron  infusions Labs noted to include:  A1C 6.6% 07/21/2024 decreased from 8.9% 01/19/2024 Lipid Panel     Component Value Date/Time   CHOL 157 05/27/2024 1316   TRIG 262 (H) 05/27/2024 1316   HDL 39 (L) 05/27/2024 1316   CHOLHDL 4.0 05/27/2024 1316   LDLCALC 75 05/27/2024 1316   LDLDIRECT 78 11/14/2022 1428   LABVLDL 43 (H) 05/27/2024 1316    64 150 lbs 08/22/2024 110 lbs 2023 after diabetes diagnosis lost to this weight  Patient lives alone.  She buys her food out as she states that it is expensive to use her stove.  She came out on disability about 2005 and is now on [employer]. Some food insecurity.  Unable to eat how I need to for my health. Skips meals due to being busy or food insecurity. She tried a low sugar diet and a smaller portion diet and lost weight but states that she got too thin.  She also stated that she did not get full then would eat junk food for snacks. Frequent diarrhea from Crohn's.  Occasional fat malabsorption. Doesn't eat meat except hot dogs and rare fish. Back and hip issues, numb feet interfere with walking at times.  Height 5' 4 (1.626 m), weight 150 lb (68 kg). Body mass index is 25.75 kg/m.   Diabetes Self-Management Education - 08/22/24 1432       Visit Information   Visit Type First/Initial      Initial Visit   Diabetes Type Type 2    Date Diagnosed 2023    Are you currently following a meal plan? No    Are you taking your medications as prescribed? Yes      Health  Coping   How would you rate your overall health? Fair      Psychosocial Assessment   Patient Belief/Attitude about Diabetes Defeat/Burnout    What is the hardest part about your diabetes right now, causing you the most concern, or is the most worrisome to you about your diabetes?   Making healty food and beverage choices    Self-care barriers None    Self-management support Doctor's office    Other persons present Patient    Patient Concerns Nutrition/Meal planning;Glycemic Control;Healthy Lifestyle    Special Needs None    Preferred Learning Style No preference indicated    Learning Readiness Ready    How often do you need to have someone help you when you read instructions, pamphlets, or other written materials from your doctor or pharmacy? 1 - Never    What is the last grade level you completed in school? 12      Pre-Education Assessment   Patient understands the diabetes disease and treatment process. Needs Instruction    Patient understands incorporating nutritional management into lifestyle. Needs Instruction    Patient undertands incorporating physical activity into lifestyle. Needs Instruction    Patient understands using medications safely. Needs Instruction    Patient understands monitoring blood glucose, interpreting and using results Needs Instruction    Patient understands prevention,  detection, and treatment of acute complications. Needs Instruction    Patient understands prevention, detection, and treatment of chronic complications. Needs Instruction    Patient understands how to develop strategies to address psychosocial issues. Needs Instruction    Patient understands how to develop strategies to promote health/change behavior. Needs Instruction      Complications   Last HgB A1C per patient/outside source 6.6 %   07/21/2024 decreased from 8.9% 01/19/2024   How often do you check your blood sugar? 0 times/day (not testing)    Have you had a dilated eye exam in the past 12  months? Yes    Have you had a dental exam in the past 12 months? Yes    Are you checking your feet? No      Dietary Intake   Breakfast lucky charms cereal, whole milk    Snack (morning) none    Lunch often skips    Snack (afternoon) chips or brownie    Dinner hotdog with a bun, chili, slaw, 1/2 regular coke    Snack (evening) ice cream    Beverage(s) water, regular soda, OJ, hawaiin punch, sweet tea      Activity / Exercise   Activity / Exercise Type Light (walking / raking leaves)    How many days per week do you exercise? 2    How many minutes per day do you exercise? 20    Total minutes per week of exercise 40      Patient Education   Previous Diabetes Education No    Disease Pathophysiology Definition of diabetes, type 1 and 2, and the diagnosis of diabetes;Explored patient's options for treatment of their diabetes    Healthy Eating Role of diet in the treatment of diabetes and the relationship between the three main macronutrients and blood glucose level;Food label reading, portion sizes and measuring food.;Plate Method;Meal options for control of blood glucose level and chronic complications.;Information on hints to eating out and maintain blood glucose control.    Being Active Role of exercise on diabetes management, blood pressure control and cardiac health.    Medications Reviewed patients medication for diabetes, action, purpose, timing of dose and side effects.    Monitoring Identified appropriate SMBG and/or A1C goals.;Taught/discussed recording of test results and interpretation of SMBG.;Daily foot exams;Yearly dilated eye exam    Acute complications Taught prevention, symptoms, and  treatment of hypoglycemia - the 15 rule.;Discussed and identified patients' prevention, symptoms, and treatment of hyperglycemia.    Chronic complications Relationship between chronic complications and blood glucose control    Diabetes Stress and Support Identified and addressed patients feelings  and concerns about diabetes;Worked with patient to identify barriers to care and solutions;Role of stress on diabetes      Individualized Goals (developed by patient)   Nutrition General guidelines for healthy choices and portions discussed    Physical Activity Exercise 5-7 days per week;30 minutes per day    Medications take my medication as prescribed    Monitoring  Not Applicable    Problem Solving Eating Pattern;Addressing barriers to behavior change    Reducing Risk examine blood glucose patterns;do foot checks daily;treat hypoglycemia with 15 grams of carbs if blood glucose less than 70mg /dL      Post-Education Assessment   Patient understands the diabetes disease and treatment process. Comprehends key points    Patient understands incorporating nutritional management into lifestyle. Needs Review    Patient undertands incorporating physical activity into lifestyle. Comprehends key points    Patient  understands using medications safely. Comphrehends key points    Patient understands monitoring blood glucose, interpreting and using results Comprehends key points    Patient understands prevention, detection, and treatment of acute complications. Comprehends key points    Patient understands prevention, detection, and treatment of chronic complications. Comprehends key points    Patient understands how to develop strategies to address psychosocial issues. Comprehends key points    Patient understands how to develop strategies to promote health/change behavior. Needs Review      Outcomes   Expected Outcomes Demonstrated interest in learning but significant barriers to change    Future DMSE 3-4 months    Program Status Not Completed          Individualized Plan for Diabetes Self-Management Training:   Learning Objective:  Patient will have a greater understanding of diabetes self-management. Patient education plan is to attend individual and/or group sessions per assessed needs and  concerns.   Plan:   Patient Instructions  Drink mostly water.  Avoid sweetened drinks. Continue to stay active. Reduce milk to 2% Avoid fried food   Expected Outcomes:  Demonstrated interest in learning but significant barriers to change  Education material provided: ADA - How to Thrive: A Guide for Your Journey with Diabetes, Food label handouts, My Plate, Snack sheet, and Diabetes Resources, Dining Out tips,  Mailed Inflammatory Bowel Disease (IBD):  Crohn's Disease and Ulcerative Colitis Nutrition Therapy  If problems or questions, patient to contact team via:  Phone  Future DSME appointment: 3-4 months

## 2024-09-05 ENCOUNTER — Ambulatory Visit

## 2024-09-05 DIAGNOSIS — J302 Other seasonal allergic rhinitis: Secondary | ICD-10-CM | POA: Diagnosis not present

## 2024-09-05 DIAGNOSIS — J309 Allergic rhinitis, unspecified: Secondary | ICD-10-CM

## 2024-09-11 ENCOUNTER — Other Ambulatory Visit: Payer: Self-pay | Admitting: Hematology and Oncology

## 2024-09-11 DIAGNOSIS — D500 Iron deficiency anemia secondary to blood loss (chronic): Secondary | ICD-10-CM

## 2024-09-12 ENCOUNTER — Inpatient Hospital Stay: Attending: Hematology and Oncology

## 2024-09-12 DIAGNOSIS — D500 Iron deficiency anemia secondary to blood loss (chronic): Secondary | ICD-10-CM | POA: Insufficient documentation

## 2024-09-12 DIAGNOSIS — K5090 Crohn's disease, unspecified, without complications: Secondary | ICD-10-CM | POA: Insufficient documentation

## 2024-09-12 LAB — CMP (CANCER CENTER ONLY)
ALT: 25 U/L (ref 0–44)
AST: 18 U/L (ref 15–41)
Albumin: 4.3 g/dL (ref 3.5–5.0)
Alkaline Phosphatase: 106 U/L (ref 38–126)
Anion gap: 9 (ref 5–15)
BUN: 9 mg/dL (ref 8–23)
CO2: 26 mmol/L (ref 22–32)
Calcium: 9.6 mg/dL (ref 8.9–10.3)
Chloride: 106 mmol/L (ref 98–111)
Creatinine: 0.74 mg/dL (ref 0.44–1.00)
GFR, Estimated: >60 mL/min (ref >60)
Glucose, Bld: 131 mg/dL — ABNORMAL HIGH (ref 70–99)
Potassium: 4.1 mmol/L (ref 3.5–5.1)
Sodium: 141 mmol/L (ref 135–145)
Total Bilirubin: 0.4 mg/dL (ref 0.0–1.2)
Total Protein: 7.3 g/dL (ref 6.5–8.1)

## 2024-09-12 LAB — IRON AND IRON BINDING CAPACITY (CC-WL,HP ONLY)
Iron: 59 ug/dL (ref 28–170)
Saturation Ratios: 15 % (ref 10.4–31.8)
TIBC: 392 ug/dL (ref 250–450)
UIBC: 333 ug/dL (ref 148–442)

## 2024-09-12 LAB — CBC WITH DIFFERENTIAL (CANCER CENTER ONLY)
Abs Immature Granulocytes: 0.04 K/uL (ref 0.00–0.07)
Basophils Absolute: 0.1 K/uL (ref 0.0–0.1)
Basophils Relative: 1 %
Eosinophils Absolute: 0.1 K/uL (ref 0.0–0.5)
Eosinophils Relative: 1 %
HCT: 40 % (ref 36.0–46.0)
Hemoglobin: 13.1 g/dL (ref 12.0–15.0)
Immature Granulocytes: 0 %
Lymphocytes Relative: 19 %
Lymphs Abs: 2.2 K/uL (ref 0.7–4.0)
MCH: 27 pg (ref 26.0–34.0)
MCHC: 32.8 g/dL (ref 30.0–36.0)
MCV: 82.3 fL (ref 80.0–100.0)
Monocytes Absolute: 1.1 K/uL — ABNORMAL HIGH (ref 0.1–1.0)
Monocytes Relative: 9 %
Neutro Abs: 8.4 K/uL — ABNORMAL HIGH (ref 1.7–7.7)
Neutrophils Relative %: 70 %
Platelet Count: 319 K/uL (ref 150–400)
RBC: 4.86 MIL/uL (ref 3.87–5.11)
RDW: 15.2 % (ref 11.5–15.5)
WBC Count: 11.9 K/uL — ABNORMAL HIGH (ref 4.0–10.5)
nRBC: 0 % (ref 0.0–0.2)

## 2024-09-12 LAB — RETIC PANEL
Immature Retic Fract: 5.1 % (ref 2.3–15.9)
RBC.: 4.9 MIL/uL (ref 3.87–5.11)
Retic Count, Absolute: 45.6 K/uL (ref 19.0–186.0)
Retic Ct Pct: 0.9 % (ref 0.4–3.1)
Reticulocyte Hemoglobin: 32.2 pg (ref >27.9)

## 2024-09-12 LAB — FERRITIN: Ferritin: 90 ng/mL (ref 11–307)

## 2024-09-19 ENCOUNTER — Inpatient Hospital Stay (HOSPITAL_BASED_OUTPATIENT_CLINIC_OR_DEPARTMENT_OTHER): Admitting: Hematology and Oncology

## 2024-09-19 VITALS — BP 149/79 | HR 82 | Temp 97.8°F | Resp 18 | Wt 146.7 lb

## 2024-09-19 DIAGNOSIS — D500 Iron deficiency anemia secondary to blood loss (chronic): Secondary | ICD-10-CM | POA: Diagnosis not present
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
## 2024-09-23 ENCOUNTER — Ambulatory Visit: Admitting: Hematology and Oncology

## 2024-09-23 ENCOUNTER — Other Ambulatory Visit

## 2024-10-06 ENCOUNTER — Ambulatory Visit: Admitting: *Deleted

## 2024-10-06 DIAGNOSIS — J302 Other seasonal allergic rhinitis: Secondary | ICD-10-CM

## 2024-10-06 DIAGNOSIS — J309 Allergic rhinitis, unspecified: Secondary | ICD-10-CM

## 2024-10-31 ENCOUNTER — Ambulatory Visit

## 2024-10-31 DIAGNOSIS — J302 Other seasonal allergic rhinitis: Secondary | ICD-10-CM | POA: Diagnosis not present

## 2024-10-31 DIAGNOSIS — J309 Allergic rhinitis, unspecified: Secondary | ICD-10-CM

## 2024-11-11 ENCOUNTER — Encounter: Admitting: Dietician

## 2024-11-24 ENCOUNTER — Other Ambulatory Visit: Payer: Self-pay

## 2024-11-24 ENCOUNTER — Ambulatory Visit: Admitting: Allergy & Immunology

## 2024-11-24 ENCOUNTER — Ambulatory Visit

## 2024-11-24 ENCOUNTER — Encounter: Payer: Self-pay | Admitting: Allergy & Immunology

## 2024-11-24 VITALS — BP 116/68 | HR 79 | Temp 97.6°F | Resp 16 | Ht 64.0 in | Wt 144.5 lb

## 2024-11-24 DIAGNOSIS — J302 Other seasonal allergic rhinitis: Secondary | ICD-10-CM | POA: Diagnosis not present

## 2024-11-24 DIAGNOSIS — E1169 Type 2 diabetes mellitus with other specified complication: Secondary | ICD-10-CM

## 2024-11-24 DIAGNOSIS — J4540 Moderate persistent asthma, uncomplicated: Secondary | ICD-10-CM

## 2024-11-24 DIAGNOSIS — K219 Gastro-esophageal reflux disease without esophagitis: Secondary | ICD-10-CM | POA: Diagnosis not present

## 2024-11-24 DIAGNOSIS — B999 Unspecified infectious disease: Secondary | ICD-10-CM

## 2024-11-24 DIAGNOSIS — J3489 Other specified disorders of nose and nasal sinuses: Secondary | ICD-10-CM | POA: Diagnosis not present

## 2024-11-24 DIAGNOSIS — J3089 Other allergic rhinitis: Secondary | ICD-10-CM

## 2024-11-24 MED ORDER — MONTELUKAST SODIUM 10 MG PO TABS: 10.0000 mg | ORAL_TABLET | Freq: Every day | ORAL | 1 refills | Status: AC

## 2024-11-24 MED ORDER — ALBUTEROL SULFATE HFA 108 (90 BASE) MCG/ACT IN AERS: INHALATION_SPRAY | RESPIRATORY_TRACT | 1 refills | Status: AC

## 2024-11-24 MED ORDER — AZELASTINE HCL 0.1 % NA SOLN: NASAL | 1 refills | Status: AC

## 2024-11-24 MED ORDER — VANICREAM CLEANSER EX LIQD: CUTANEOUS | 5 refills | Status: AC

## 2024-11-24 MED ORDER — OMEPRAZOLE 40 MG PO CPDR: 40.0000 mg | DELAYED_RELEASE_CAPSULE | Freq: Every day | ORAL | 1 refills | Status: AC

## 2024-11-24 MED ORDER — LEVOCETIRIZINE DIHYDROCHLORIDE 5 MG PO TABS: 5.0000 mg | ORAL_TABLET | Freq: Every evening | ORAL | 1 refills | Status: AC

## 2024-11-24 MED ORDER — TRIAMCINOLONE ACETONIDE 0.5 % EX OINT: TOPICAL_OINTMENT | CUTANEOUS | 3 refills | Status: AC
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
## 2024-11-24 NOTE — Patient Instructions (Addendum)
 1. Moderate persistent asthma, uncomplicated - Spirometry looks great today which is good news.  - Daily controller medication(s): Singulair  (montelukast ) 10mg  daily - Prior to physical activity: albuterol  2 puffs 10-15 minutes before physical activity. - Rescue medications: albuterol  4 puffs every 4-6 hours as needed - Changes during respiratory infections or worsening symptoms: Add on Symbicort  to 2 puffs twice daily for TWO WEEKS. - Asthma control goals:  * Full participation in all desired activities (may need albuterol  before activity) * Albuterol  use two time or less a week on average (not counting use with activity) * Cough interfering with sleep two time or less a month * Oral steroids no more than once a year * No hospitalizations  2. Perennial and seasonal allergic rhinitis - We are going to increase the cat in the vial for your next set of vials. - I would strongly consider getting a HEPA filter for your bedroom at least to decrease the dander load in the air.  - We will see how this stronger cat dander works for you.  - Continue with all of the nose sprays as needed.  - Continue with your cetirizine  and the montelukast .  - Continue with the allergy  shots at the same schedule.   3. Recurrent infections - Most recent workup was fairly reassuring.  - I do not think that we need to do anything else with regards to your immune system.  4. Hand and finger dermatitis - with wound healing problems - This looks a lot better.  - CONTINUE with moisturizing as you are doing. - Continue with the triamcinolone  as needed.   6. Diabetes  - Follow by Dr. Dartha with Endocrinology.   7. Iron  deficiency anemia - from Crohn's disease - Ask your hematologist whether you can have Venofer  (Iron  Sucrose) instead. - This is what you tolerated back in 2022.   8. Return in about 6 months (around 05/24/2025). You can have the follow up appointment with Dr. Iva or a Nurse Practicioner (our  Nurse Practitioners are excellent and always have Physician oversight!).    Please inform us  of any Emergency Department visits, hospitalizations, or changes in symptoms. Call us  before going to the ED for breathing or allergy  symptoms since we might be able to fit you in for a sick visit. Feel free to contact us  anytime with any questions, problems, or concerns.  It was a pleasure to see you again today!  Websites that have reliable patient information: 1. American Academy of Asthma, Allergy , and Immunology: www.aaaai.org 2. Food Allergy  Research and Education (FARE): foodallergy.org 3. Mothers of Asthmatics: http://www.asthmacommunitynetwork.org 4. [employer] of Allergy , Asthma, and Immunology: www.acaai.org      Like us  on [employer] and Instagram for our latest updates!      A healthy democracy works best when [employer] participate! Make sure you are registered to vote! If you have moved or changed any of your contact information, you will need to get this updated before voting! Scan the QR codes below to learn more!

## 2024-11-27 ENCOUNTER — Encounter: Payer: Self-pay | Admitting: Allergy & Immunology

## 2024-11-29 NOTE — Progress Notes (Signed)
 VIALS MADE ON 11/29/24

## 2024-12-01 NOTE — Progress Notes (Signed)
 Aeroallergen Immunotherapy (NOTE NEW SCRIPT)  Ordering Provider: Marty Shaggy, MD  Patient Details Name: Kyomi Hector MRN: [id number] Date of Birth: 1961/11/02   Order 2 of 2  Vial Label: G/W/T/C/D/DM  0.3 ml (Volume)  BAU Concentration -- 7 Grass Mix* 100,000 ([place]  Blue, Tennant, Orchard, Perennial Rye, RedTop, Sweet Vernal, Timothy) 0.3 ml (Volume)  BAU Concentration -- [place] 10,000 0.2 ml (Volume)  1:20 Concentration -- Johnson 0.5 ml (Volume)  1:20 Concentration -- Weed Mix* 0.5 ml (Volume)  1:20 Concentration -- Eastern 10 Tree Mix (also Sweet Gum) 0.1 ml (Volume)  1:10 Concentration -- Valrie mix* 0.1 ml (Volume)  1:20 Concentration -- Beech American* 0.2 ml (Volume)  1:10 Concentration -- Cedar, red 0.1 ml (Volume)  1:10 Concentration -- Hickory* 0.1 ml (Volume)  1:20 Concentration -- Maple Mix* 0.2 ml (Volume)  1:10 Concentration -- Pecan Pollen 1.0 ml (Volume)  1:10 Concentration -- Cat Hair 0.5 ml (Volume)  1:10 Concentration -- Dog Epithelia 0.8 ml (Volume)   AU Concentration -- Mite Mix (DF 5,000 & DP 5,000)   4.9  ml Extract Subtotal 0.1  ml Diluent  5.0  ml Maintenance Total  Schedule:  D  Red Vial (1:1): Schedule D (3 doses)  Special Instructions: After completion of the first Red Vial, please space to every four weeks.

## 2024-12-19 ENCOUNTER — Encounter: Admitting: Dietician

## 2025-01-09 ENCOUNTER — Ambulatory Visit: Admitting: "Endocrinology

## 2025-01-19 ENCOUNTER — Ambulatory Visit: Admitting: Allergy & Immunology

## 2025-03-15 ENCOUNTER — Inpatient Hospital Stay

## 2025-03-22 ENCOUNTER — Inpatient Hospital Stay: Admitting: Hematology and Oncology

## 2025-05-25 ENCOUNTER — Ambulatory Visit: Admitting: Allergy & Immunology
